# Patient Record
Sex: Female | Born: 1935
Health system: Southern US, Community
[De-identification: ages and names within clinical notes are randomized; demographics above are authoritative.]

## PROBLEM LIST (undated history)

## (undated) DIAGNOSIS — E119 Type 2 diabetes mellitus without complications: Secondary | ICD-10-CM

## (undated) DIAGNOSIS — L570 Actinic keratosis: Secondary | ICD-10-CM

## (undated) HISTORY — DX: Actinic keratosis: L57.0

## (undated) HISTORY — DX: Type 2 diabetes mellitus without complications: E11.9

---

## 2005-06-05 ENCOUNTER — Ambulatory Visit: Payer: Self-pay | Admitting: Internal Medicine

## 2005-12-28 ENCOUNTER — Ambulatory Visit: Payer: Self-pay | Admitting: Internal Medicine

## 2006-06-10 ENCOUNTER — Ambulatory Visit: Payer: Self-pay | Admitting: Internal Medicine

## 2007-08-01 ENCOUNTER — Ambulatory Visit: Payer: Self-pay | Admitting: Internal Medicine

## 2008-08-03 ENCOUNTER — Ambulatory Visit: Payer: Self-pay | Admitting: Internal Medicine

## 2009-08-13 ENCOUNTER — Ambulatory Visit: Payer: Self-pay | Admitting: Internal Medicine

## 2009-08-15 ENCOUNTER — Ambulatory Visit: Payer: Self-pay | Admitting: Internal Medicine

## 2010-02-18 ENCOUNTER — Ambulatory Visit: Payer: Self-pay | Admitting: Internal Medicine

## 2010-04-07 DIAGNOSIS — C4491 Basal cell carcinoma of skin, unspecified: Secondary | ICD-10-CM

## 2010-04-07 HISTORY — DX: Basal cell carcinoma of skin, unspecified: C44.91

## 2011-10-07 ENCOUNTER — Ambulatory Visit: Payer: Self-pay

## 2012-10-14 ENCOUNTER — Ambulatory Visit: Payer: Self-pay | Admitting: Internal Medicine

## 2013-03-21 ENCOUNTER — Ambulatory Visit: Payer: Self-pay | Admitting: Gastroenterology

## 2013-12-22 ENCOUNTER — Ambulatory Visit: Payer: Self-pay | Admitting: Internal Medicine

## 2015-03-12 ENCOUNTER — Other Ambulatory Visit: Payer: Self-pay | Admitting: Internal Medicine

## 2015-03-12 DIAGNOSIS — Z1231 Encounter for screening mammogram for malignant neoplasm of breast: Secondary | ICD-10-CM

## 2015-03-13 ENCOUNTER — Ambulatory Visit
Admission: RE | Admit: 2015-03-13 | Discharge: 2015-03-13 | Disposition: A | Discharge status: Home / Self Care | Payer: Medicare Other | Source: Ambulatory Visit | Attending: Internal Medicine | Admitting: Internal Medicine

## 2015-03-13 ENCOUNTER — Other Ambulatory Visit: Payer: Self-pay | Admitting: Internal Medicine

## 2015-03-13 DIAGNOSIS — Z1231 Encounter for screening mammogram for malignant neoplasm of breast: Secondary | ICD-10-CM

## 2015-12-09 DIAGNOSIS — E1169 Type 2 diabetes mellitus with other specified complication: Secondary | ICD-10-CM | POA: Diagnosis not present

## 2015-12-09 DIAGNOSIS — E785 Hyperlipidemia, unspecified: Secondary | ICD-10-CM | POA: Diagnosis not present

## 2015-12-09 DIAGNOSIS — N183 Chronic kidney disease, stage 3 (moderate): Secondary | ICD-10-CM | POA: Diagnosis not present

## 2015-12-09 DIAGNOSIS — E1122 Type 2 diabetes mellitus with diabetic chronic kidney disease: Secondary | ICD-10-CM | POA: Diagnosis not present

## 2015-12-16 DIAGNOSIS — E1122 Type 2 diabetes mellitus with diabetic chronic kidney disease: Secondary | ICD-10-CM | POA: Diagnosis not present

## 2015-12-16 DIAGNOSIS — E1169 Type 2 diabetes mellitus with other specified complication: Secondary | ICD-10-CM | POA: Diagnosis not present

## 2015-12-16 DIAGNOSIS — M1A9XX0 Chronic gout, unspecified, without tophus (tophi): Secondary | ICD-10-CM | POA: Diagnosis not present

## 2015-12-16 DIAGNOSIS — I10 Essential (primary) hypertension: Secondary | ICD-10-CM | POA: Diagnosis not present

## 2015-12-16 DIAGNOSIS — N183 Chronic kidney disease, stage 3 (moderate): Secondary | ICD-10-CM | POA: Diagnosis not present

## 2015-12-16 DIAGNOSIS — E785 Hyperlipidemia, unspecified: Secondary | ICD-10-CM | POA: Diagnosis not present

## 2016-02-17 DIAGNOSIS — H33311 Horseshoe tear of retina without detachment, right eye: Secondary | ICD-10-CM | POA: Diagnosis not present

## 2016-03-12 DIAGNOSIS — Y92099 Unspecified place in other non-institutional residence as the place of occurrence of the external cause: Secondary | ICD-10-CM | POA: Diagnosis not present

## 2016-03-12 DIAGNOSIS — W19XXXA Unspecified fall, initial encounter: Secondary | ICD-10-CM | POA: Diagnosis not present

## 2016-03-12 DIAGNOSIS — S39012A Strain of muscle, fascia and tendon of lower back, initial encounter: Secondary | ICD-10-CM | POA: Diagnosis not present

## 2016-04-03 DIAGNOSIS — H33311 Horseshoe tear of retina without detachment, right eye: Secondary | ICD-10-CM | POA: Diagnosis not present

## 2016-04-13 DIAGNOSIS — E1122 Type 2 diabetes mellitus with diabetic chronic kidney disease: Secondary | ICD-10-CM | POA: Diagnosis not present

## 2016-04-13 DIAGNOSIS — N183 Chronic kidney disease, stage 3 (moderate): Secondary | ICD-10-CM | POA: Diagnosis not present

## 2016-04-13 DIAGNOSIS — I10 Essential (primary) hypertension: Secondary | ICD-10-CM | POA: Diagnosis not present

## 2016-04-13 DIAGNOSIS — E1169 Type 2 diabetes mellitus with other specified complication: Secondary | ICD-10-CM | POA: Diagnosis not present

## 2016-04-13 DIAGNOSIS — E785 Hyperlipidemia, unspecified: Secondary | ICD-10-CM | POA: Diagnosis not present

## 2016-04-20 DIAGNOSIS — E785 Hyperlipidemia, unspecified: Secondary | ICD-10-CM | POA: Diagnosis not present

## 2016-04-20 DIAGNOSIS — E1169 Type 2 diabetes mellitus with other specified complication: Secondary | ICD-10-CM | POA: Diagnosis not present

## 2016-04-20 DIAGNOSIS — N183 Chronic kidney disease, stage 3 (moderate): Secondary | ICD-10-CM | POA: Diagnosis not present

## 2016-04-20 DIAGNOSIS — I10 Essential (primary) hypertension: Secondary | ICD-10-CM | POA: Diagnosis not present

## 2016-04-20 DIAGNOSIS — E1122 Type 2 diabetes mellitus with diabetic chronic kidney disease: Secondary | ICD-10-CM | POA: Diagnosis not present

## 2016-04-20 DIAGNOSIS — M1A9XX0 Chronic gout, unspecified, without tophus (tophi): Secondary | ICD-10-CM | POA: Diagnosis not present

## 2016-05-05 ENCOUNTER — Other Ambulatory Visit: Payer: Self-pay | Admitting: Internal Medicine

## 2016-05-05 DIAGNOSIS — I8393 Asymptomatic varicose veins of bilateral lower extremities: Secondary | ICD-10-CM | POA: Diagnosis not present

## 2016-05-05 DIAGNOSIS — L821 Other seborrheic keratosis: Secondary | ICD-10-CM | POA: Diagnosis not present

## 2016-05-05 DIAGNOSIS — L820 Inflamed seborrheic keratosis: Secondary | ICD-10-CM | POA: Diagnosis not present

## 2016-05-05 DIAGNOSIS — D1800 Hemangioma unspecified site: Secondary | ICD-10-CM | POA: Diagnosis not present

## 2016-05-05 DIAGNOSIS — L814 Other melanin hyperpigmentation: Secondary | ICD-10-CM | POA: Diagnosis not present

## 2016-05-05 DIAGNOSIS — Z85828 Personal history of other malignant neoplasm of skin: Secondary | ICD-10-CM | POA: Diagnosis not present

## 2016-05-05 DIAGNOSIS — Z1231 Encounter for screening mammogram for malignant neoplasm of breast: Secondary | ICD-10-CM

## 2016-05-05 DIAGNOSIS — Z1283 Encounter for screening for malignant neoplasm of skin: Secondary | ICD-10-CM | POA: Diagnosis not present

## 2016-05-05 DIAGNOSIS — L905 Scar conditions and fibrosis of skin: Secondary | ICD-10-CM | POA: Diagnosis not present

## 2016-05-25 ENCOUNTER — Ambulatory Visit
Admission: RE | Admit: 2016-05-25 | Discharge: 2016-05-25 | Disposition: A | Discharge status: Home / Self Care | Payer: PPO | Source: Ambulatory Visit | Attending: Internal Medicine | Admitting: Internal Medicine

## 2016-05-25 DIAGNOSIS — Z1231 Encounter for screening mammogram for malignant neoplasm of breast: Secondary | ICD-10-CM | POA: Diagnosis not present

## 2016-05-29 DIAGNOSIS — Z23 Encounter for immunization: Secondary | ICD-10-CM | POA: Diagnosis not present

## 2016-09-14 DIAGNOSIS — E785 Hyperlipidemia, unspecified: Secondary | ICD-10-CM | POA: Diagnosis not present

## 2016-09-14 DIAGNOSIS — I10 Essential (primary) hypertension: Secondary | ICD-10-CM | POA: Diagnosis not present

## 2016-09-14 DIAGNOSIS — E1122 Type 2 diabetes mellitus with diabetic chronic kidney disease: Secondary | ICD-10-CM | POA: Diagnosis not present

## 2016-09-14 DIAGNOSIS — N183 Chronic kidney disease, stage 3 (moderate): Secondary | ICD-10-CM | POA: Diagnosis not present

## 2016-09-14 DIAGNOSIS — E1169 Type 2 diabetes mellitus with other specified complication: Secondary | ICD-10-CM | POA: Diagnosis not present

## 2016-09-21 DIAGNOSIS — I10 Essential (primary) hypertension: Secondary | ICD-10-CM | POA: Diagnosis not present

## 2016-09-21 DIAGNOSIS — E1122 Type 2 diabetes mellitus with diabetic chronic kidney disease: Secondary | ICD-10-CM | POA: Diagnosis not present

## 2016-09-21 DIAGNOSIS — N183 Chronic kidney disease, stage 3 (moderate): Secondary | ICD-10-CM | POA: Diagnosis not present

## 2016-09-21 DIAGNOSIS — E785 Hyperlipidemia, unspecified: Secondary | ICD-10-CM | POA: Diagnosis not present

## 2016-09-21 DIAGNOSIS — Z0000 Encounter for general adult medical examination without abnormal findings: Secondary | ICD-10-CM | POA: Diagnosis not present

## 2016-09-21 DIAGNOSIS — E1169 Type 2 diabetes mellitus with other specified complication: Secondary | ICD-10-CM | POA: Diagnosis not present

## 2017-01-11 DIAGNOSIS — E785 Hyperlipidemia, unspecified: Secondary | ICD-10-CM | POA: Diagnosis not present

## 2017-01-11 DIAGNOSIS — I10 Essential (primary) hypertension: Secondary | ICD-10-CM | POA: Diagnosis not present

## 2017-01-11 DIAGNOSIS — N183 Chronic kidney disease, stage 3 (moderate): Secondary | ICD-10-CM | POA: Diagnosis not present

## 2017-01-11 DIAGNOSIS — E1122 Type 2 diabetes mellitus with diabetic chronic kidney disease: Secondary | ICD-10-CM | POA: Diagnosis not present

## 2017-01-11 DIAGNOSIS — E1169 Type 2 diabetes mellitus with other specified complication: Secondary | ICD-10-CM | POA: Diagnosis not present

## 2017-01-18 DIAGNOSIS — I10 Essential (primary) hypertension: Secondary | ICD-10-CM | POA: Diagnosis not present

## 2017-01-18 DIAGNOSIS — E1169 Type 2 diabetes mellitus with other specified complication: Secondary | ICD-10-CM | POA: Diagnosis not present

## 2017-01-18 DIAGNOSIS — E1122 Type 2 diabetes mellitus with diabetic chronic kidney disease: Secondary | ICD-10-CM | POA: Diagnosis not present

## 2017-01-18 DIAGNOSIS — N183 Chronic kidney disease, stage 3 (moderate): Secondary | ICD-10-CM | POA: Diagnosis not present

## 2017-01-18 DIAGNOSIS — E785 Hyperlipidemia, unspecified: Secondary | ICD-10-CM | POA: Diagnosis not present

## 2017-01-18 DIAGNOSIS — M1A9XX0 Chronic gout, unspecified, without tophus (tophi): Secondary | ICD-10-CM | POA: Diagnosis not present

## 2017-05-17 DIAGNOSIS — N183 Chronic kidney disease, stage 3 (moderate): Secondary | ICD-10-CM | POA: Diagnosis not present

## 2017-05-17 DIAGNOSIS — L578 Other skin changes due to chronic exposure to nonionizing radiation: Secondary | ICD-10-CM | POA: Diagnosis not present

## 2017-05-17 DIAGNOSIS — D1800 Hemangioma unspecified site: Secondary | ICD-10-CM | POA: Diagnosis not present

## 2017-05-17 DIAGNOSIS — I8393 Asymptomatic varicose veins of bilateral lower extremities: Secondary | ICD-10-CM | POA: Diagnosis not present

## 2017-05-17 DIAGNOSIS — E785 Hyperlipidemia, unspecified: Secondary | ICD-10-CM | POA: Diagnosis not present

## 2017-05-17 DIAGNOSIS — E1122 Type 2 diabetes mellitus with diabetic chronic kidney disease: Secondary | ICD-10-CM | POA: Diagnosis not present

## 2017-05-17 DIAGNOSIS — L821 Other seborrheic keratosis: Secondary | ICD-10-CM | POA: Diagnosis not present

## 2017-05-17 DIAGNOSIS — E1169 Type 2 diabetes mellitus with other specified complication: Secondary | ICD-10-CM | POA: Diagnosis not present

## 2017-05-17 DIAGNOSIS — I10 Essential (primary) hypertension: Secondary | ICD-10-CM | POA: Diagnosis not present

## 2017-05-17 DIAGNOSIS — L570 Actinic keratosis: Secondary | ICD-10-CM | POA: Diagnosis not present

## 2017-05-17 DIAGNOSIS — Z1283 Encounter for screening for malignant neoplasm of skin: Secondary | ICD-10-CM | POA: Diagnosis not present

## 2017-05-17 DIAGNOSIS — Z85828 Personal history of other malignant neoplasm of skin: Secondary | ICD-10-CM | POA: Diagnosis not present

## 2017-05-24 DIAGNOSIS — E1122 Type 2 diabetes mellitus with diabetic chronic kidney disease: Secondary | ICD-10-CM | POA: Diagnosis not present

## 2017-05-24 DIAGNOSIS — N183 Chronic kidney disease, stage 3 (moderate): Secondary | ICD-10-CM | POA: Diagnosis not present

## 2017-05-24 DIAGNOSIS — I10 Essential (primary) hypertension: Secondary | ICD-10-CM | POA: Diagnosis not present

## 2017-05-24 DIAGNOSIS — E785 Hyperlipidemia, unspecified: Secondary | ICD-10-CM | POA: Diagnosis not present

## 2017-05-24 DIAGNOSIS — E1169 Type 2 diabetes mellitus with other specified complication: Secondary | ICD-10-CM | POA: Diagnosis not present

## 2017-05-28 DIAGNOSIS — Z23 Encounter for immunization: Secondary | ICD-10-CM | POA: Diagnosis not present

## 2017-07-15 ENCOUNTER — Other Ambulatory Visit: Payer: Self-pay | Admitting: Internal Medicine

## 2017-07-15 DIAGNOSIS — Z1231 Encounter for screening mammogram for malignant neoplasm of breast: Secondary | ICD-10-CM

## 2017-08-10 ENCOUNTER — Ambulatory Visit
Admission: RE | Admit: 2017-08-10 | Discharge: 2017-08-10 | Disposition: A | Discharge status: Home / Self Care | Payer: PPO | Source: Ambulatory Visit | Attending: Internal Medicine | Admitting: Internal Medicine

## 2017-08-10 DIAGNOSIS — Z1231 Encounter for screening mammogram for malignant neoplasm of breast: Secondary | ICD-10-CM | POA: Diagnosis not present

## 2017-08-19 DIAGNOSIS — H2513 Age-related nuclear cataract, bilateral: Secondary | ICD-10-CM | POA: Diagnosis not present

## 2017-09-20 DIAGNOSIS — I10 Essential (primary) hypertension: Secondary | ICD-10-CM | POA: Diagnosis not present

## 2017-09-20 DIAGNOSIS — E785 Hyperlipidemia, unspecified: Secondary | ICD-10-CM | POA: Diagnosis not present

## 2017-09-20 DIAGNOSIS — E1122 Type 2 diabetes mellitus with diabetic chronic kidney disease: Secondary | ICD-10-CM | POA: Diagnosis not present

## 2017-09-20 DIAGNOSIS — E1169 Type 2 diabetes mellitus with other specified complication: Secondary | ICD-10-CM | POA: Diagnosis not present

## 2017-09-20 DIAGNOSIS — N183 Chronic kidney disease, stage 3 (moderate): Secondary | ICD-10-CM | POA: Diagnosis not present

## 2017-09-27 DIAGNOSIS — E1122 Type 2 diabetes mellitus with diabetic chronic kidney disease: Secondary | ICD-10-CM | POA: Diagnosis not present

## 2017-09-27 DIAGNOSIS — N183 Chronic kidney disease, stage 3 (moderate): Secondary | ICD-10-CM | POA: Diagnosis not present

## 2017-09-27 DIAGNOSIS — I10 Essential (primary) hypertension: Secondary | ICD-10-CM | POA: Diagnosis not present

## 2017-09-27 DIAGNOSIS — E1169 Type 2 diabetes mellitus with other specified complication: Secondary | ICD-10-CM | POA: Diagnosis not present

## 2017-09-27 DIAGNOSIS — E785 Hyperlipidemia, unspecified: Secondary | ICD-10-CM | POA: Diagnosis not present

## 2017-10-21 DIAGNOSIS — R399 Unspecified symptoms and signs involving the genitourinary system: Secondary | ICD-10-CM | POA: Diagnosis not present

## 2017-10-21 DIAGNOSIS — M5431 Sciatica, right side: Secondary | ICD-10-CM | POA: Diagnosis not present

## 2017-10-21 DIAGNOSIS — J069 Acute upper respiratory infection, unspecified: Secondary | ICD-10-CM | POA: Diagnosis not present

## 2017-10-21 DIAGNOSIS — M5432 Sciatica, left side: Secondary | ICD-10-CM | POA: Diagnosis not present

## 2017-10-21 DIAGNOSIS — K219 Gastro-esophageal reflux disease without esophagitis: Secondary | ICD-10-CM | POA: Diagnosis not present

## 2018-01-17 DIAGNOSIS — E1122 Type 2 diabetes mellitus with diabetic chronic kidney disease: Secondary | ICD-10-CM | POA: Diagnosis not present

## 2018-01-17 DIAGNOSIS — N183 Chronic kidney disease, stage 3 (moderate): Secondary | ICD-10-CM | POA: Diagnosis not present

## 2018-01-17 DIAGNOSIS — E1169 Type 2 diabetes mellitus with other specified complication: Secondary | ICD-10-CM | POA: Diagnosis not present

## 2018-01-17 DIAGNOSIS — E785 Hyperlipidemia, unspecified: Secondary | ICD-10-CM | POA: Diagnosis not present

## 2018-01-17 DIAGNOSIS — I10 Essential (primary) hypertension: Secondary | ICD-10-CM | POA: Diagnosis not present

## 2018-01-20 DIAGNOSIS — I10 Essential (primary) hypertension: Secondary | ICD-10-CM | POA: Diagnosis not present

## 2018-01-20 DIAGNOSIS — G5693 Unspecified mononeuropathy of bilateral upper limbs: Secondary | ICD-10-CM | POA: Diagnosis not present

## 2018-01-20 DIAGNOSIS — E785 Hyperlipidemia, unspecified: Secondary | ICD-10-CM | POA: Diagnosis not present

## 2018-01-20 DIAGNOSIS — E1169 Type 2 diabetes mellitus with other specified complication: Secondary | ICD-10-CM | POA: Diagnosis not present

## 2018-01-20 DIAGNOSIS — Z0000 Encounter for general adult medical examination without abnormal findings: Secondary | ICD-10-CM | POA: Diagnosis not present

## 2018-01-20 DIAGNOSIS — E1122 Type 2 diabetes mellitus with diabetic chronic kidney disease: Secondary | ICD-10-CM | POA: Diagnosis not present

## 2018-01-20 DIAGNOSIS — N183 Chronic kidney disease, stage 3 (moderate): Secondary | ICD-10-CM | POA: Diagnosis not present

## 2018-01-24 DIAGNOSIS — W010XXA Fall on same level from slipping, tripping and stumbling without subsequent striking against object, initial encounter: Secondary | ICD-10-CM | POA: Diagnosis not present

## 2018-01-24 DIAGNOSIS — Y92009 Unspecified place in unspecified non-institutional (private) residence as the place of occurrence of the external cause: Secondary | ICD-10-CM | POA: Diagnosis not present

## 2018-01-24 DIAGNOSIS — S79911A Unspecified injury of right hip, initial encounter: Secondary | ICD-10-CM | POA: Diagnosis not present

## 2018-02-15 DIAGNOSIS — M1A9XX0 Chronic gout, unspecified, without tophus (tophi): Secondary | ICD-10-CM | POA: Diagnosis not present

## 2018-02-15 DIAGNOSIS — S7001XA Contusion of right hip, initial encounter: Secondary | ICD-10-CM | POA: Diagnosis not present

## 2018-02-15 DIAGNOSIS — S7011XA Contusion of right thigh, initial encounter: Secondary | ICD-10-CM | POA: Diagnosis not present

## 2018-02-15 DIAGNOSIS — E1122 Type 2 diabetes mellitus with diabetic chronic kidney disease: Secondary | ICD-10-CM | POA: Diagnosis not present

## 2018-02-15 DIAGNOSIS — N183 Chronic kidney disease, stage 3 (moderate): Secondary | ICD-10-CM | POA: Diagnosis not present

## 2018-03-15 DIAGNOSIS — M542 Cervicalgia: Secondary | ICD-10-CM | POA: Diagnosis not present

## 2018-03-15 DIAGNOSIS — R200 Anesthesia of skin: Secondary | ICD-10-CM | POA: Diagnosis not present

## 2018-03-25 DIAGNOSIS — G5603 Carpal tunnel syndrome, bilateral upper limbs: Secondary | ICD-10-CM | POA: Diagnosis not present

## 2018-04-01 DIAGNOSIS — G5603 Carpal tunnel syndrome, bilateral upper limbs: Secondary | ICD-10-CM | POA: Diagnosis not present

## 2018-05-17 DIAGNOSIS — L570 Actinic keratosis: Secondary | ICD-10-CM | POA: Diagnosis not present

## 2018-05-17 DIAGNOSIS — Z85828 Personal history of other malignant neoplasm of skin: Secondary | ICD-10-CM | POA: Diagnosis not present

## 2018-05-17 DIAGNOSIS — Z1283 Encounter for screening for malignant neoplasm of skin: Secondary | ICD-10-CM | POA: Diagnosis not present

## 2018-05-17 DIAGNOSIS — L300 Nummular dermatitis: Secondary | ICD-10-CM | POA: Diagnosis not present

## 2018-05-17 DIAGNOSIS — D1800 Hemangioma unspecified site: Secondary | ICD-10-CM | POA: Diagnosis not present

## 2018-05-17 DIAGNOSIS — L720 Epidermal cyst: Secondary | ICD-10-CM | POA: Diagnosis not present

## 2018-05-17 DIAGNOSIS — L820 Inflamed seborrheic keratosis: Secondary | ICD-10-CM | POA: Diagnosis not present

## 2018-05-17 DIAGNOSIS — L821 Other seborrheic keratosis: Secondary | ICD-10-CM | POA: Diagnosis not present

## 2018-05-23 DIAGNOSIS — N183 Chronic kidney disease, stage 3 (moderate): Secondary | ICD-10-CM | POA: Diagnosis not present

## 2018-05-23 DIAGNOSIS — G5693 Unspecified mononeuropathy of bilateral upper limbs: Secondary | ICD-10-CM | POA: Diagnosis not present

## 2018-05-23 DIAGNOSIS — E1122 Type 2 diabetes mellitus with diabetic chronic kidney disease: Secondary | ICD-10-CM | POA: Diagnosis not present

## 2018-05-23 DIAGNOSIS — E1169 Type 2 diabetes mellitus with other specified complication: Secondary | ICD-10-CM | POA: Diagnosis not present

## 2018-05-23 DIAGNOSIS — E785 Hyperlipidemia, unspecified: Secondary | ICD-10-CM | POA: Diagnosis not present

## 2018-05-30 DIAGNOSIS — E1169 Type 2 diabetes mellitus with other specified complication: Secondary | ICD-10-CM | POA: Diagnosis not present

## 2018-05-30 DIAGNOSIS — N183 Chronic kidney disease, stage 3 (moderate): Secondary | ICD-10-CM | POA: Diagnosis not present

## 2018-05-30 DIAGNOSIS — E1122 Type 2 diabetes mellitus with diabetic chronic kidney disease: Secondary | ICD-10-CM | POA: Diagnosis not present

## 2018-05-30 DIAGNOSIS — I10 Essential (primary) hypertension: Secondary | ICD-10-CM | POA: Diagnosis not present

## 2018-05-30 DIAGNOSIS — E785 Hyperlipidemia, unspecified: Secondary | ICD-10-CM | POA: Diagnosis not present

## 2018-06-10 DIAGNOSIS — Z23 Encounter for immunization: Secondary | ICD-10-CM | POA: Diagnosis not present

## 2018-07-12 ENCOUNTER — Other Ambulatory Visit: Payer: Self-pay | Admitting: Internal Medicine

## 2018-07-12 DIAGNOSIS — Z1231 Encounter for screening mammogram for malignant neoplasm of breast: Secondary | ICD-10-CM

## 2018-08-11 ENCOUNTER — Ambulatory Visit
Admission: RE | Admit: 2018-08-11 | Discharge: 2018-08-11 | Disposition: A | Discharge status: Home / Self Care | Payer: PPO | Source: Ambulatory Visit | Attending: Internal Medicine | Admitting: Internal Medicine

## 2018-08-11 DIAGNOSIS — Z1231 Encounter for screening mammogram for malignant neoplasm of breast: Secondary | ICD-10-CM | POA: Diagnosis not present

## 2018-09-26 DIAGNOSIS — I10 Essential (primary) hypertension: Secondary | ICD-10-CM | POA: Diagnosis not present

## 2018-09-26 DIAGNOSIS — E785 Hyperlipidemia, unspecified: Secondary | ICD-10-CM | POA: Diagnosis not present

## 2018-09-26 DIAGNOSIS — E1122 Type 2 diabetes mellitus with diabetic chronic kidney disease: Secondary | ICD-10-CM | POA: Diagnosis not present

## 2018-09-26 DIAGNOSIS — N183 Chronic kidney disease, stage 3 (moderate): Secondary | ICD-10-CM | POA: Diagnosis not present

## 2018-09-26 DIAGNOSIS — E1169 Type 2 diabetes mellitus with other specified complication: Secondary | ICD-10-CM | POA: Diagnosis not present

## 2018-10-03 DIAGNOSIS — E1122 Type 2 diabetes mellitus with diabetic chronic kidney disease: Secondary | ICD-10-CM | POA: Diagnosis not present

## 2018-10-03 DIAGNOSIS — I10 Essential (primary) hypertension: Secondary | ICD-10-CM | POA: Diagnosis not present

## 2018-10-03 DIAGNOSIS — E785 Hyperlipidemia, unspecified: Secondary | ICD-10-CM | POA: Diagnosis not present

## 2018-10-03 DIAGNOSIS — N183 Chronic kidney disease, stage 3 (moderate): Secondary | ICD-10-CM | POA: Diagnosis not present

## 2018-10-03 DIAGNOSIS — E1169 Type 2 diabetes mellitus with other specified complication: Secondary | ICD-10-CM | POA: Diagnosis not present

## 2019-02-13 DIAGNOSIS — I10 Essential (primary) hypertension: Secondary | ICD-10-CM | POA: Diagnosis not present

## 2019-02-13 DIAGNOSIS — E1122 Type 2 diabetes mellitus with diabetic chronic kidney disease: Secondary | ICD-10-CM | POA: Diagnosis not present

## 2019-02-13 DIAGNOSIS — Z0000 Encounter for general adult medical examination without abnormal findings: Secondary | ICD-10-CM | POA: Diagnosis not present

## 2019-02-13 DIAGNOSIS — E785 Hyperlipidemia, unspecified: Secondary | ICD-10-CM | POA: Diagnosis not present

## 2019-02-13 DIAGNOSIS — N183 Chronic kidney disease, stage 3 (moderate): Secondary | ICD-10-CM | POA: Diagnosis not present

## 2019-02-13 DIAGNOSIS — E1169 Type 2 diabetes mellitus with other specified complication: Secondary | ICD-10-CM | POA: Diagnosis not present

## 2019-03-02 DIAGNOSIS — H2513 Age-related nuclear cataract, bilateral: Secondary | ICD-10-CM | POA: Diagnosis not present

## 2019-05-23 DIAGNOSIS — Z1283 Encounter for screening for malignant neoplasm of skin: Secondary | ICD-10-CM | POA: Diagnosis not present

## 2019-05-23 DIAGNOSIS — D1800 Hemangioma unspecified site: Secondary | ICD-10-CM | POA: Diagnosis not present

## 2019-05-23 DIAGNOSIS — L821 Other seborrheic keratosis: Secondary | ICD-10-CM | POA: Diagnosis not present

## 2019-05-23 DIAGNOSIS — L820 Inflamed seborrheic keratosis: Secondary | ICD-10-CM | POA: Diagnosis not present

## 2019-05-23 DIAGNOSIS — Z85828 Personal history of other malignant neoplasm of skin: Secondary | ICD-10-CM | POA: Diagnosis not present

## 2019-06-09 DIAGNOSIS — Z23 Encounter for immunization: Secondary | ICD-10-CM | POA: Diagnosis not present

## 2019-06-12 DIAGNOSIS — E785 Hyperlipidemia, unspecified: Secondary | ICD-10-CM | POA: Diagnosis not present

## 2019-06-12 DIAGNOSIS — N183 Chronic kidney disease, stage 3 unspecified: Secondary | ICD-10-CM | POA: Diagnosis not present

## 2019-06-12 DIAGNOSIS — E1169 Type 2 diabetes mellitus with other specified complication: Secondary | ICD-10-CM | POA: Diagnosis not present

## 2019-06-12 DIAGNOSIS — E1122 Type 2 diabetes mellitus with diabetic chronic kidney disease: Secondary | ICD-10-CM | POA: Diagnosis not present

## 2019-06-12 DIAGNOSIS — I10 Essential (primary) hypertension: Secondary | ICD-10-CM | POA: Diagnosis not present

## 2019-06-19 DIAGNOSIS — E1121 Type 2 diabetes mellitus with diabetic nephropathy: Secondary | ICD-10-CM | POA: Diagnosis not present

## 2019-06-19 DIAGNOSIS — Z9989 Dependence on other enabling machines and devices: Secondary | ICD-10-CM | POA: Diagnosis not present

## 2019-06-19 DIAGNOSIS — E1169 Type 2 diabetes mellitus with other specified complication: Secondary | ICD-10-CM | POA: Diagnosis not present

## 2019-06-19 DIAGNOSIS — I10 Essential (primary) hypertension: Secondary | ICD-10-CM | POA: Diagnosis not present

## 2019-06-19 DIAGNOSIS — E785 Hyperlipidemia, unspecified: Secondary | ICD-10-CM | POA: Diagnosis not present

## 2019-06-19 DIAGNOSIS — N1832 Chronic kidney disease, stage 3b: Secondary | ICD-10-CM | POA: Diagnosis not present

## 2019-07-07 ENCOUNTER — Other Ambulatory Visit: Payer: Self-pay | Admitting: Internal Medicine

## 2019-07-07 DIAGNOSIS — Z1231 Encounter for screening mammogram for malignant neoplasm of breast: Secondary | ICD-10-CM

## 2019-08-14 ENCOUNTER — Ambulatory Visit
Admission: RE | Admit: 2019-08-14 | Discharge: 2019-08-14 | Disposition: A | Discharge status: Home / Self Care | Payer: PPO | Source: Ambulatory Visit | Attending: Internal Medicine | Admitting: Internal Medicine

## 2019-08-14 DIAGNOSIS — Z1231 Encounter for screening mammogram for malignant neoplasm of breast: Secondary | ICD-10-CM | POA: Diagnosis not present

## 2019-10-09 DIAGNOSIS — E1169 Type 2 diabetes mellitus with other specified complication: Secondary | ICD-10-CM | POA: Diagnosis not present

## 2019-10-09 DIAGNOSIS — E1121 Type 2 diabetes mellitus with diabetic nephropathy: Secondary | ICD-10-CM | POA: Diagnosis not present

## 2019-10-09 DIAGNOSIS — I10 Essential (primary) hypertension: Secondary | ICD-10-CM | POA: Diagnosis not present

## 2019-10-09 DIAGNOSIS — E785 Hyperlipidemia, unspecified: Secondary | ICD-10-CM | POA: Diagnosis not present

## 2019-10-09 DIAGNOSIS — N1832 Chronic kidney disease, stage 3b: Secondary | ICD-10-CM | POA: Diagnosis not present

## 2019-10-16 DIAGNOSIS — E785 Hyperlipidemia, unspecified: Secondary | ICD-10-CM | POA: Diagnosis not present

## 2019-10-16 DIAGNOSIS — I10 Essential (primary) hypertension: Secondary | ICD-10-CM | POA: Diagnosis not present

## 2019-10-16 DIAGNOSIS — E1169 Type 2 diabetes mellitus with other specified complication: Secondary | ICD-10-CM | POA: Diagnosis not present

## 2019-10-16 DIAGNOSIS — N1832 Chronic kidney disease, stage 3b: Secondary | ICD-10-CM | POA: Diagnosis not present

## 2019-10-16 DIAGNOSIS — E1121 Type 2 diabetes mellitus with diabetic nephropathy: Secondary | ICD-10-CM | POA: Diagnosis not present

## 2019-10-16 DIAGNOSIS — Z9989 Dependence on other enabling machines and devices: Secondary | ICD-10-CM | POA: Diagnosis not present

## 2020-02-12 DIAGNOSIS — N1832 Chronic kidney disease, stage 3b: Secondary | ICD-10-CM | POA: Diagnosis not present

## 2020-02-12 DIAGNOSIS — E1169 Type 2 diabetes mellitus with other specified complication: Secondary | ICD-10-CM | POA: Diagnosis not present

## 2020-02-12 DIAGNOSIS — I10 Essential (primary) hypertension: Secondary | ICD-10-CM | POA: Diagnosis not present

## 2020-02-12 DIAGNOSIS — E785 Hyperlipidemia, unspecified: Secondary | ICD-10-CM | POA: Diagnosis not present

## 2020-02-12 DIAGNOSIS — E1122 Type 2 diabetes mellitus with diabetic chronic kidney disease: Secondary | ICD-10-CM | POA: Diagnosis not present

## 2020-02-19 DIAGNOSIS — Z9989 Dependence on other enabling machines and devices: Secondary | ICD-10-CM | POA: Diagnosis not present

## 2020-02-19 DIAGNOSIS — I10 Essential (primary) hypertension: Secondary | ICD-10-CM | POA: Diagnosis not present

## 2020-02-19 DIAGNOSIS — E1122 Type 2 diabetes mellitus with diabetic chronic kidney disease: Secondary | ICD-10-CM | POA: Diagnosis not present

## 2020-02-19 DIAGNOSIS — N1832 Chronic kidney disease, stage 3b: Secondary | ICD-10-CM | POA: Diagnosis not present

## 2020-02-19 DIAGNOSIS — E785 Hyperlipidemia, unspecified: Secondary | ICD-10-CM | POA: Diagnosis not present

## 2020-02-19 DIAGNOSIS — Z0000 Encounter for general adult medical examination without abnormal findings: Secondary | ICD-10-CM | POA: Diagnosis not present

## 2020-02-19 DIAGNOSIS — E1169 Type 2 diabetes mellitus with other specified complication: Secondary | ICD-10-CM | POA: Diagnosis not present

## 2020-03-04 DIAGNOSIS — E119 Type 2 diabetes mellitus without complications: Secondary | ICD-10-CM | POA: Diagnosis not present

## 2020-05-21 ENCOUNTER — Ambulatory Visit: Payer: PPO | Admitting: Dermatology

## 2020-05-21 ENCOUNTER — Other Ambulatory Visit: Payer: Self-pay

## 2020-05-21 ENCOUNTER — Encounter: Payer: Self-pay | Admitting: Dermatology

## 2020-05-21 DIAGNOSIS — Z1283 Encounter for screening for malignant neoplasm of skin: Secondary | ICD-10-CM | POA: Diagnosis not present

## 2020-05-21 DIAGNOSIS — M7122 Synovial cyst of popliteal space [Baker], left knee: Secondary | ICD-10-CM

## 2020-05-21 DIAGNOSIS — D1800 Hemangioma unspecified site: Secondary | ICD-10-CM | POA: Diagnosis not present

## 2020-05-21 DIAGNOSIS — I8310 Varicose veins of unspecified lower extremity with inflammation: Secondary | ICD-10-CM

## 2020-05-21 DIAGNOSIS — L821 Other seborrheic keratosis: Secondary | ICD-10-CM

## 2020-05-21 DIAGNOSIS — L578 Other skin changes due to chronic exposure to nonionizing radiation: Secondary | ICD-10-CM

## 2020-05-21 DIAGNOSIS — L814 Other melanin hyperpigmentation: Secondary | ICD-10-CM

## 2020-05-21 DIAGNOSIS — L300 Nummular dermatitis: Secondary | ICD-10-CM | POA: Diagnosis not present

## 2020-05-21 DIAGNOSIS — Z85828 Personal history of other malignant neoplasm of skin: Secondary | ICD-10-CM

## 2020-05-21 DIAGNOSIS — L820 Inflamed seborrheic keratosis: Secondary | ICD-10-CM | POA: Diagnosis not present

## 2020-05-21 MED ORDER — TRIAMCINOLONE ACETONIDE 0.1 % EX CREA: TOPICAL_CREAM | CUTANEOUS | 2 refills | Status: DC

## 2020-05-21 NOTE — Progress Notes (Signed)
Follow-Up Visit   Subjective  Tracy Spears is a 86 y.o. female who presents for the following: Annual Exam (TBSE. Hx BCC Right paranasal) and Nummular Dermatitis (Uses TMC 0.1% Cream prn. She has an itchy spot on her right upper back today.).   The following portions of the chart were reviewed this encounter and updated as appropriate:      Review of Systems:  No other skin or systemic complaints except as noted in HPI or Assessment and Plan.  Objective  Well appearing patient in no apparent distress; mood and affect are within normal limits.  A full examination was performed including scalp, head, eyes, ears, nose, lips, neck, chest, axillae, abdomen, back, buttocks, bilateral upper extremities, bilateral lower extremities, hands, feet, fingers, toes, fingernails, and toenails. All findings within normal limits unless otherwise noted below.  Objective  Right Upper Back: Pink scaly patch with excoriations on right upper back.  Objective  Right Posterior Upper Arm: Waxy flesh patch with mild erythema. Small residual waxy papule on left mid eyelid at margin.  Objective  Left Popliteal: Firm SQ mass.   Assessment & Plan  Nummular dermatitis Right Upper Back  Continue TMC 0.1% Cream QD/BID AAs prn itch  Recommend mild soap and moisturizing cream 1-2 times daily.   Topical steroids (such as triamcinolone, fluocinolone, fluocinonide, mometasone, clobetasol, halobetasol, betamethasone, hydrocortisone) can cause thinning and lightening of the skin if they are used for too long in the same area. Your physician has selected the right strength medicine for your problem and area affected on the body. Please use your medication only as directed by your physician to prevent side effects.    triamcinolone cream (KENALOG) 0.1 % - Right Upper Back  Inflamed seborrheic keratosis Right Posterior Upper Arm  Apply TMC 0.1% Cream BID R upper arm prn itch until improved.  Rec  moisturizer qd.  Left upper eyelid at margin- improved from prior Ln2.  Not bothersome- will observe.  Bakers cyst, left Left Popliteal  Benign, observe.  Not bothersome to patient.   Skin cancer screening performed today.  Actinic Damage - diffuse scaly erythematous macules with underlying dyspigmentation - Recommend daily broad spectrum sunscreen SPF 30+ to sun-exposed areas, reapply every 2 hours as needed.  - Call for new or changing lesions.  History of Basal Cell Carcinoma of the Skin - No evidence of recurrence today R paranasal - Recommend regular full body skin exams - Recommend daily broad spectrum sunscreen SPF 30+ to sun-exposed areas, reapply every 2 hours as needed.  - Call if any new or changing lesions are noted between office visits  Seborrheic Keratoses - Stuck-on, waxy, tan-brown papules and plaques  - Discussed benign etiology and prognosis. - Observe - Call for any changes  Hemangiomas - Red papules - Discussed benign nature - Observe - Call for any changes  Lentigines - Scattered tan macules - Discussed due to sun exposure - Benign, observe - Call for any changes  Varicose Veins - Dilated blue, purple or red veins at the lower extremities - Reassured - These can be treated by sclerotherapy (a procedure to inject a medicine into the veins to make them disappear) if desired, but the treatment is not covered by insurance   Return in about 1 year (around 05/21/2021) for TBSE.  IJamesetta Orleans, CMA, am acting as scribe for Brendolyn Patty, MD .  Documentation: I have reviewed the above documentation for accuracy and completeness, and I agree with the above.  Baxter Flattery  Nicole Kindred MD

## 2020-05-21 NOTE — Patient Instructions (Signed)
Topical steroids (such as triamcinolone, fluocinolone, fluocinonide, mometasone, clobetasol, halobetasol, betamethasone, hydrocortisone) can cause thinning and lightening of the skin if they are used for too long in the same area. Your physician has selected the right strength medicine for your problem and area affected on the body. Please use your medication only as directed by your physician to prevent side effects.   . 

## 2020-06-17 DIAGNOSIS — E1122 Type 2 diabetes mellitus with diabetic chronic kidney disease: Secondary | ICD-10-CM | POA: Diagnosis not present

## 2020-06-17 DIAGNOSIS — I10 Essential (primary) hypertension: Secondary | ICD-10-CM | POA: Diagnosis not present

## 2020-06-17 DIAGNOSIS — E785 Hyperlipidemia, unspecified: Secondary | ICD-10-CM | POA: Diagnosis not present

## 2020-06-17 DIAGNOSIS — E1169 Type 2 diabetes mellitus with other specified complication: Secondary | ICD-10-CM | POA: Diagnosis not present

## 2020-06-17 DIAGNOSIS — N1832 Chronic kidney disease, stage 3b: Secondary | ICD-10-CM | POA: Diagnosis not present

## 2020-06-24 DIAGNOSIS — N1832 Chronic kidney disease, stage 3b: Secondary | ICD-10-CM | POA: Diagnosis not present

## 2020-06-24 DIAGNOSIS — Z23 Encounter for immunization: Secondary | ICD-10-CM | POA: Diagnosis not present

## 2020-06-24 DIAGNOSIS — I10 Essential (primary) hypertension: Secondary | ICD-10-CM | POA: Diagnosis not present

## 2020-06-24 DIAGNOSIS — E1122 Type 2 diabetes mellitus with diabetic chronic kidney disease: Secondary | ICD-10-CM | POA: Diagnosis not present

## 2020-06-24 DIAGNOSIS — Z9989 Dependence on other enabling machines and devices: Secondary | ICD-10-CM | POA: Diagnosis not present

## 2020-06-24 DIAGNOSIS — E785 Hyperlipidemia, unspecified: Secondary | ICD-10-CM | POA: Diagnosis not present

## 2020-06-24 DIAGNOSIS — E1169 Type 2 diabetes mellitus with other specified complication: Secondary | ICD-10-CM | POA: Diagnosis not present

## 2020-07-17 ENCOUNTER — Other Ambulatory Visit: Payer: Self-pay | Admitting: Internal Medicine

## 2020-07-17 DIAGNOSIS — Z1231 Encounter for screening mammogram for malignant neoplasm of breast: Secondary | ICD-10-CM

## 2020-08-21 ENCOUNTER — Ambulatory Visit
Admission: RE | Admit: 2020-08-21 | Discharge: 2020-08-21 | Disposition: A | Discharge status: Home / Self Care | Payer: PPO | Source: Ambulatory Visit | Attending: Internal Medicine | Admitting: Internal Medicine

## 2020-08-21 ENCOUNTER — Other Ambulatory Visit: Payer: Self-pay

## 2020-08-21 DIAGNOSIS — Z1231 Encounter for screening mammogram for malignant neoplasm of breast: Secondary | ICD-10-CM | POA: Insufficient documentation

## 2020-10-21 DIAGNOSIS — E1169 Type 2 diabetes mellitus with other specified complication: Secondary | ICD-10-CM | POA: Diagnosis not present

## 2020-10-21 DIAGNOSIS — I10 Essential (primary) hypertension: Secondary | ICD-10-CM | POA: Diagnosis not present

## 2020-10-21 DIAGNOSIS — E1122 Type 2 diabetes mellitus with diabetic chronic kidney disease: Secondary | ICD-10-CM | POA: Diagnosis not present

## 2020-10-21 DIAGNOSIS — N1832 Chronic kidney disease, stage 3b: Secondary | ICD-10-CM | POA: Diagnosis not present

## 2020-10-21 DIAGNOSIS — E785 Hyperlipidemia, unspecified: Secondary | ICD-10-CM | POA: Diagnosis not present

## 2020-10-28 DIAGNOSIS — E1169 Type 2 diabetes mellitus with other specified complication: Secondary | ICD-10-CM | POA: Diagnosis not present

## 2020-10-28 DIAGNOSIS — E785 Hyperlipidemia, unspecified: Secondary | ICD-10-CM | POA: Diagnosis not present

## 2020-10-28 DIAGNOSIS — E1122 Type 2 diabetes mellitus with diabetic chronic kidney disease: Secondary | ICD-10-CM | POA: Diagnosis not present

## 2020-10-28 DIAGNOSIS — N1832 Chronic kidney disease, stage 3b: Secondary | ICD-10-CM | POA: Diagnosis not present

## 2020-10-28 DIAGNOSIS — I10 Essential (primary) hypertension: Secondary | ICD-10-CM | POA: Diagnosis not present

## 2020-10-28 DIAGNOSIS — Z9989 Dependence on other enabling machines and devices: Secondary | ICD-10-CM | POA: Diagnosis not present

## 2020-12-13 ENCOUNTER — Encounter: Payer: Self-pay | Admitting: Intensive Care

## 2020-12-13 ENCOUNTER — Emergency Department: Payer: PPO

## 2020-12-13 ENCOUNTER — Other Ambulatory Visit: Payer: Self-pay

## 2020-12-13 ENCOUNTER — Emergency Department
Admission: EM | Admit: 2020-12-13 | Discharge: 2020-12-13 | Disposition: A | Discharge status: Home / Self Care | Payer: PPO | Attending: Emergency Medicine | Admitting: Emergency Medicine

## 2020-12-13 DIAGNOSIS — M1611 Unilateral primary osteoarthritis, right hip: Secondary | ICD-10-CM | POA: Diagnosis not present

## 2020-12-13 DIAGNOSIS — W06XXXA Fall from bed, initial encounter: Secondary | ICD-10-CM | POA: Insufficient documentation

## 2020-12-13 DIAGNOSIS — Z7984 Long term (current) use of oral hypoglycemic drugs: Secondary | ICD-10-CM | POA: Diagnosis not present

## 2020-12-13 DIAGNOSIS — S8991XA Unspecified injury of right lower leg, initial encounter: Secondary | ICD-10-CM | POA: Diagnosis not present

## 2020-12-13 DIAGNOSIS — M76891 Other specified enthesopathies of right lower limb, excluding foot: Secondary | ICD-10-CM | POA: Diagnosis not present

## 2020-12-13 DIAGNOSIS — E119 Type 2 diabetes mellitus without complications: Secondary | ICD-10-CM | POA: Insufficient documentation

## 2020-12-13 DIAGNOSIS — Z85828 Personal history of other malignant neoplasm of skin: Secondary | ICD-10-CM | POA: Diagnosis not present

## 2020-12-13 DIAGNOSIS — M533 Sacrococcygeal disorders, not elsewhere classified: Secondary | ICD-10-CM | POA: Diagnosis not present

## 2020-12-13 DIAGNOSIS — M79604 Pain in right leg: Secondary | ICD-10-CM

## 2020-12-13 DIAGNOSIS — Z7982 Long term (current) use of aspirin: Secondary | ICD-10-CM | POA: Diagnosis not present

## 2020-12-13 DIAGNOSIS — M25561 Pain in right knee: Secondary | ICD-10-CM | POA: Diagnosis not present

## 2020-12-13 DIAGNOSIS — M549 Dorsalgia, unspecified: Secondary | ICD-10-CM | POA: Insufficient documentation

## 2020-12-13 MED ORDER — LIDOCAINE 5 % EX PTCH
1.0000 | MEDICATED_PATCH | CUTANEOUS | Status: DC
  Administered 2020-12-13: 1 via TRANSDERMAL
  Filled 2020-12-13: qty 1

## 2020-12-13 MED ORDER — LIDOCAINE 4 % EX PTCH: 1.0000 | MEDICATED_PATCH | CUTANEOUS | 0 refills | Status: DC

## 2020-12-13 MED ORDER — DICLOFENAC SODIUM 1 % EX GEL: 2.0000 g | Freq: Three times a day (TID) | CUTANEOUS | 0 refills | Status: DC | PRN

## 2020-12-13 NOTE — Discharge Instructions (Addendum)
Please take Tylenol, up to 500mg  as needed for pain. You may also use the lidocaine patches and voltaren gel as prescribed. Please follow up with your primary care or return to the ER with any worsening.

## 2020-12-13 NOTE — ED Provider Notes (Signed)
[facility] Emergency Department Provider Note  ____________________________________________   Event Date/Time   First MD Initiated Contact with Patient 12/13/20 1715     (approximate)  I have reviewed the triage vital signs and the nursing notes.   HISTORY  Chief Complaint Leg Pain   HPI Tracy Spears is a 87 y.o. female who presents to the ER for evaluation of right leg pain. Patient states that 2 mornings ago, she believes she pulled something in her leg trying to get out of bed. She denies any falls, but states persistent pain in the right knee and calf region since that time. She does have hx of right sided hip fractures about 10 months ago, but states she has been back to weight bearing without difficulty before the onset of the right lower leg pain. She denies any redness, swelling, fevers, chills, or falls. She denies any hx of DVT but does report prior work up for one. She does report some mild chronic back pain, but states this is her stable pain and is unchanged. She denies any urinary symptoms, loss of bowel or bladder. She has continued to weight bear on the RLE since onset of pain 2 days ago, but states she is walking with a limp.         Past Medical History:  Diagnosis Date  . Actinic keratosis   . Basal cell carcinoma 04/07/2010   Right paranasal.   . Diabetes mellitus without complication ([place])     There are no problems to display for this patient.   History reviewed. No pertinent surgical history.  Prior to Admission medications   Medication Sig Start Date End Date Taking? Authorizing Provider  amLODipine (NORVASC) 5 MG tablet Take by mouth. 02/19/20   [provider]  aspirin 81 MG EC tablet Take by mouth.    [provider]  atorvastatin (LIPITOR) 10 MG tablet Take 1 tablet by mouth daily. 12/22/19   [provider]  carvedilol (COREG) 25 MG tablet Take by mouth. 02/19/20   [provider]   Cholecalciferol 50 MCG (2000 UT) CAPS Take by mouth.    [provider]  colchicine 0.6 MG tablet Take 2 tablets (1.2mg ) by mouth at first sign of gout flare followed by 1 tablet (0.6mg ) after 1 hour. (Max 1.8mg  within 1 hour) 02/19/20   [provider]  furosemide (LASIX) 20 MG tablet TAKE ONE TABLET BY MOUTH DAILY AS NEEDED FOR EDEMA 12/04/19   [provider]  gabapentin (NEURONTIN) 300 MG capsule Take 1 capsule by mouth 3 (three) times daily. 02/06/20   [provider]  losartan-hydrochlorothiazide (HYZAAR) 100-25 MG tablet Take 1 tablet by mouth daily. 12/22/19   [provider]  metFORMIN (GLUCOPHAGE-XR) 500 MG 24 hr tablet TAKE THREE TABLETS BY MOUTH DAILY WITH DINNER 03/22/20   [provider]  Multiple Vitamin (MULTI-VITAMIN) tablet Take 1 tablet by mouth daily.    [provider]  pantoprazole (PROTONIX) 40 MG tablet Take 1 tablet by mouth daily. 04/02/20   [provider]  potassium chloride (KLOR-CON) 10 MEQ tablet Take 1 tablet by mouth daily. 12/22/19   [provider]  triamcinolone cream (KENALOG) 0.1 % Apply topically. 05/17/18   [provider]  triamcinolone cream (KENALOG) 0.1 % Apply to itchy rash on body 1-2 times daily until improved. Avoid face, groin, underarms. 05/21/20   Brendolyn Patty, MD    Allergies Patient has no known allergies.  Family History  Problem  Relation Age of Onset  . Breast cancer Other     Social History Social History   Tobacco Use  . Smoking status: Never Smoker  . Smokeless tobacco: Never Used  Substance Use Topics  . Alcohol use: Never  . Drug use: Never    Review of Systems Constitutional: No fever/chills Eyes: No visual changes. ENT: No sore throat. Cardiovascular: Denies chest pain. Respiratory: Denies shortness of breath. Gastrointestinal: No abdominal pain.  No nausea, no vomiting.  No diarrhea.  No constipation. Genitourinary: Negative for  dysuria. Musculoskeletal: +right leg pain, Negative for back pain. Skin: Negative for rash. Neurological: Negative for headaches, focal weakness or numbness.  ____________________________________________   PHYSICAL EXAM:  VITAL SIGNS: ED Triage Vitals  Enc Vitals Group     BP 12/13/20 1658 136/74     Pulse Rate 12/13/20 1658 62     Resp 12/13/20 1658 18     Temp 12/13/20 1658 99.2 F (37.3 C)     Temp Source 12/13/20 1658 Oral     SpO2 12/13/20 1658 96 %     Weight 12/13/20 1722 145 lb (65.8 kg)     Height 12/13/20 1722 5\' 2"  (1.575 m)     Head Circumference --      Peak Flow --      Pain Score 12/13/20 1659 6     Pain Loc --      Pain Edu? --      Excl. in [place]? --    Constitutional: Alert and oriented. Well appearing and in no acute distress. Eyes: Conjunctivae are normal. PERRL. EOMI. Head: Atraumatic. Nose: No congestion/rhinnorhea. Mouth/Throat: Mucous membranes are moist.  Oropharynx non-erythematous. Neck: No stridor.  Cardiovascular: Normal rate, regular rhythm. Grossly normal heart sounds.  Good peripheral circulation. Respiratory: Normal respiratory effort.  No retractions. Lungs CTAB. Gastrointestinal: Soft and nontender. No distention. No abdominal bruits. No CVA tenderness. Musculoskeletal: There is tenderness to palpation of the medial aspect of the right calf. No erythema, ecchymosis or swelling noted to the calf. Compartments are soft. Mild swelling appreciated about the right knee. ROM equal to contralateral side of knee. Negative hip log roll. Negative thompsons. Patient able to actively dorsiflex and plantarflex the right ankle with 5/5 strength. Color normal, dorsal pedal pulses 2+, capillary refill brisk. Neurologic:  Normal speech and language. No gross focal neurologic deficits are appreciated. No gait instability. Skin:  Skin is warm, dry and intact. No rash noted. Psychiatric: Mood and affect are normal. Speech and behavior are  normal.  ____________________________________________  RADIOLOGY I, Marlana Salvage, personally viewed and evaluated these images (plain radiographs) as part of my medical decision making, as well as reviewing the written report by the radiologist.  ED provider interpretation:  XR of the right hip demonstrates osteoarthritic changes without any acute fracture  Official radiology report(s): US Venous Img Lower Unilateral Right (DVT)  Result Date: 12/13/2020 CLINICAL DATA:  Right leg pain. EXAM: RIGHT LOWER EXTREMITY VENOUS DOPPLER ULTRASOUND TECHNIQUE: Gray-scale sonography with compression, as well as color and duplex ultrasound, were performed to evaluate the deep venous system(s) from the level of the common femoral vein through the popliteal and proximal calf veins. COMPARISON:  None. FINDINGS: VENOUS Normal compressibility of the common femoral, superficial femoral, and popliteal veins, as well as the visualized calf veins. Visualized portions of profunda femoral vein and great saphenous vein unremarkable. No filling defects to suggest DVT on grayscale or color Doppler imaging. Doppler waveforms show normal direction of venous flow, normal  respiratory plasticity and response to augmentation. Limited views of the contralateral common femoral vein are unremarkable. OTHER None. Limitations: none IMPRESSION: No evidence of right lower extremity DVT. Electronically Signed   By: Keith Rake M.D.   On: 12/13/2020 18:33   DG Knee Complete 4 Views Right  Result Date: 12/13/2020 CLINICAL DATA:  Right knee pain. EXAM: RIGHT KNEE - COMPLETE 4+ VIEW COMPARISON:  None. FINDINGS: No evidence of fracture, dislocation, or joint effusion. Tricompartmental osteoarthritis with peripheral spurring. Medial and lateral tibiofemoral chondrocalcinosis. Suggestion of mild capsular calcification. Mild generalized soft tissue edema. IMPRESSION: 1. Tricompartmental osteoarthritis with chondrocalcinosis. No acute osseous  abnormality. 2. Generalized soft tissue edema. Electronically Signed   By: Keith Rake M.D.   On: 12/13/2020 18:11   DG Hip Unilat W or Wo Pelvis 2-3 Views Right  Result Date: 12/13/2020 CLINICAL DATA:  Right leg pain. EXAM: DG HIP (WITH OR WITHOUT PELVIS) 2-3V RIGHT COMPARISON:  None. FINDINGS: Mild right hip joint space narrowing. Moderate acetabular spurring, medial femoral head spurring. Femoral head is well seated. No evidence of fracture, avascular necrosis or focal lesion. Pubic rami and bony pelvis are intact. Degenerative change of the pubic symphysis and sacroiliac joints which remain congruent. Soft tissue calcifications adjacent to the right greater trochanter may be enthesopathic. Bilateral ischial enthesopathy. IMPRESSION: 1. Mild to moderate right hip osteoarthritis. 2. Degenerative change of the pubic symphysis and sacroiliac joints. Electronically Signed   By: Keith Rake M.D.   On: 12/13/2020 18:13    ____________________________________________   INITIAL IMPRESSION / ASSESSMENT AND PLAN / ED COURSE  As part of my medical decision making, I reviewed the following data within the [facility] notes reviewed and incorporated, Radiograph reviewed and Notes from prior ED visits        Patient is a 87 yo female who presents to the ER for evaluation of right LE injury 2 days ago when getting out of bed. See HPI for further details. On PE, she has tenderness to medial calf and mild swelling about right knee, but ROM of the knee and ankle are WNL and she is neurovascularly intact. XR was obtained of knee and hip which are negative. DVT study was performed and negative for acute DVT. Suspect musculoskeletal cause of pain. Will initiate trial of topical voltaren gel due to patient having preexisting CKD as well as lidocaine patches and tylenol. Will defer use of muscle relaxers at this time given increased fall risk. Patient is amenable with this plan. She is  stable at this time for outpatient follow up. Return precautions were discussed.       ____________________________________________   FINAL CLINICAL IMPRESSION(S) / ED DIAGNOSES  Final diagnoses:  Right leg pain     ED Discharge Orders    None      *Please note:  Tracy Spears was evaluated in Emergency Department on 12/13/2020 for the symptoms described in the history of present illness. She was evaluated in the context of the global COVID-19 pandemic, which necessitated consideration that the patient might be at risk for infection with the SARS-CoV-2 virus that causes COVID-19. Institutional protocols and algorithms that pertain to the evaluation of patients at risk for COVID-19 are in a state of rapid change based on information released by regulatory bodies including the CDC and federal and state organizations. These policies and algorithms were followed during the patient's care in the ED.  Some ED evaluations and interventions may be delayed as a result  of limited staffing during and the pandemic.*   Note:  This document was prepared using Dragon voice recognition software and may include unintentional dictation errors.   Marlana Salvage, PA 12/14/20 1516    Carrie Mew, MD 12/15/20 0001

## 2020-12-13 NOTE — ED Triage Notes (Signed)
Patient c/o right leg pain that started while getting into bed two nights ago. Denies injury. Ambulatory with a cane but painful

## 2020-12-13 NOTE — ED Notes (Signed)
See triage note  Presents with right lower leg pain  States pain is from knee to posterior lower leg  Denies any injury

## 2021-02-17 DIAGNOSIS — E1122 Type 2 diabetes mellitus with diabetic chronic kidney disease: Secondary | ICD-10-CM | POA: Diagnosis not present

## 2021-02-17 DIAGNOSIS — E1169 Type 2 diabetes mellitus with other specified complication: Secondary | ICD-10-CM | POA: Diagnosis not present

## 2021-02-17 DIAGNOSIS — I10 Essential (primary) hypertension: Secondary | ICD-10-CM | POA: Diagnosis not present

## 2021-02-17 DIAGNOSIS — N1832 Chronic kidney disease, stage 3b: Secondary | ICD-10-CM | POA: Diagnosis not present

## 2021-02-17 DIAGNOSIS — E785 Hyperlipidemia, unspecified: Secondary | ICD-10-CM | POA: Diagnosis not present

## 2021-02-26 DIAGNOSIS — E1169 Type 2 diabetes mellitus with other specified complication: Secondary | ICD-10-CM | POA: Diagnosis not present

## 2021-02-26 DIAGNOSIS — I10 Essential (primary) hypertension: Secondary | ICD-10-CM | POA: Diagnosis not present

## 2021-02-26 DIAGNOSIS — E785 Hyperlipidemia, unspecified: Secondary | ICD-10-CM | POA: Diagnosis not present

## 2021-02-26 DIAGNOSIS — Z0000 Encounter for general adult medical examination without abnormal findings: Secondary | ICD-10-CM | POA: Diagnosis not present

## 2021-02-26 DIAGNOSIS — E1122 Type 2 diabetes mellitus with diabetic chronic kidney disease: Secondary | ICD-10-CM | POA: Diagnosis not present

## 2021-02-26 DIAGNOSIS — Z9989 Dependence on other enabling machines and devices: Secondary | ICD-10-CM | POA: Diagnosis not present

## 2021-02-26 DIAGNOSIS — N1832 Chronic kidney disease, stage 3b: Secondary | ICD-10-CM | POA: Diagnosis not present

## 2021-03-05 DIAGNOSIS — E119 Type 2 diabetes mellitus without complications: Secondary | ICD-10-CM | POA: Diagnosis not present

## 2021-03-09 ENCOUNTER — Other Ambulatory Visit: Payer: Self-pay

## 2021-03-09 ENCOUNTER — Emergency Department: Payer: PPO

## 2021-03-09 ENCOUNTER — Emergency Department
Admission: EM | Admit: 2021-03-09 | Discharge: 2021-03-09 | Disposition: A | Discharge status: Home / Self Care | Payer: PPO | Attending: Emergency Medicine | Admitting: Emergency Medicine

## 2021-03-09 ENCOUNTER — Encounter: Payer: Self-pay | Admitting: Emergency Medicine

## 2021-03-09 DIAGNOSIS — E119 Type 2 diabetes mellitus without complications: Secondary | ICD-10-CM | POA: Diagnosis not present

## 2021-03-09 DIAGNOSIS — Y92009 Unspecified place in unspecified non-institutional (private) residence as the place of occurrence of the external cause: Secondary | ICD-10-CM | POA: Insufficient documentation

## 2021-03-09 DIAGNOSIS — Z85828 Personal history of other malignant neoplasm of skin: Secondary | ICD-10-CM | POA: Diagnosis not present

## 2021-03-09 DIAGNOSIS — Z7984 Long term (current) use of oral hypoglycemic drugs: Secondary | ICD-10-CM | POA: Diagnosis not present

## 2021-03-09 DIAGNOSIS — Z7982 Long term (current) use of aspirin: Secondary | ICD-10-CM | POA: Insufficient documentation

## 2021-03-09 DIAGNOSIS — M25552 Pain in left hip: Secondary | ICD-10-CM | POA: Diagnosis not present

## 2021-03-09 DIAGNOSIS — S72009A Fracture of unspecified part of neck of unspecified femur, initial encounter for closed fracture: Secondary | ICD-10-CM | POA: Diagnosis not present

## 2021-03-09 DIAGNOSIS — W19XXXA Unspecified fall, initial encounter: Secondary | ICD-10-CM

## 2021-03-09 DIAGNOSIS — T1490XA Injury, unspecified, initial encounter: Secondary | ICD-10-CM | POA: Diagnosis not present

## 2021-03-09 DIAGNOSIS — S79912A Unspecified injury of left hip, initial encounter: Secondary | ICD-10-CM | POA: Diagnosis not present

## 2021-03-09 DIAGNOSIS — W01198A Fall on same level from slipping, tripping and stumbling with subsequent striking against other object, initial encounter: Secondary | ICD-10-CM | POA: Insufficient documentation

## 2021-03-09 DIAGNOSIS — I10 Essential (primary) hypertension: Secondary | ICD-10-CM | POA: Diagnosis not present

## 2021-03-09 MED ORDER — ACETAMINOPHEN 500 MG PO TABS
1000.0000 mg | ORAL_TABLET | Freq: Once | ORAL | Status: AC
  Administered 2021-03-09: 1000 mg via ORAL
  Filled 2021-03-09: qty 2

## 2021-03-09 NOTE — ED Notes (Signed)
Pt walked 10 ft and back. She stated that it feels like she pulled a muscle but able to bear weight. She normally walks with a cane but did not have one with her.

## 2021-03-09 NOTE — ED Triage Notes (Signed)
  Patient BIB EMS after fall at home. Patient was moving some things in the house and her shoe got caught and she fell on her L hip.  No dizziness or SOB.  No syncopal symptoms.  Patient was advised by family to stay on the ground til EMS arrived and was able to stand when EMS arrived. Patient has no complaints of pain.

## 2021-03-09 NOTE — ED Provider Notes (Signed)
[facility] Emergency Department Provider Note  ____________________________________________   Event Date/Time   First MD Initiated Contact with Patient 03/09/21 2139     (approximate)  I have reviewed the triage vital signs and the nursing notes.   HISTORY  Chief Complaint Fall    HPI Tracy Spears is a 87 y.o. female with past medical history of diabetes here with fall.  Patient states her foot got stuck on a bench just prior to arrival.  She fell, striking her left hip.  She does not use blood thinners.  Denies any head injury.  No chest pain.  Patient has some mild, aching, cramp-like left lower hip pain.  No pain throughout the other abdomen.  No chest pain.  No shortness of breath.  She has not tried ambulate.  Pain is mild, not specifically worse with any kind of movement.  No distal numbness or weakness.    Past Medical History:  Diagnosis Date   Actinic keratosis    Basal cell carcinoma 04/07/2010   Right paranasal.    Diabetes mellitus without complication ([place])     There are no problems to display for this patient.   History reviewed. No pertinent surgical history.  Prior to Admission medications   Medication Sig Start Date End Date Taking? Authorizing Provider  amLODipine (NORVASC) 5 MG tablet Take by mouth. 02/19/20   [provider]  aspirin 81 MG EC tablet Take by mouth.    [provider]  atorvastatin (LIPITOR) 10 MG tablet Take 1 tablet by mouth daily. 12/22/19   [provider]  carvedilol (COREG) 25 MG tablet Take by mouth. 02/19/20   [provider]  Cholecalciferol 50 MCG (2000 UT) CAPS Take by mouth.    [provider]  colchicine 0.6 MG tablet Take 2 tablets (1.2mg ) by mouth at first sign of gout flare followed by 1 tablet (0.6mg ) after 1 hour. (Max 1.8mg  within 1 hour) 02/19/20   [provider]  diclofenac Sodium (VOLTAREN) 1 % GEL Apply 2 g topically 3 (three) times  daily as needed. 12/13/20   Marlana Salvage, PA  furosemide (LASIX) 20 MG tablet TAKE ONE TABLET BY MOUTH DAILY AS NEEDED FOR EDEMA 12/04/19   [provider]  gabapentin (NEURONTIN) 300 MG capsule Take 1 capsule by mouth 3 (three) times daily. 02/06/20   [provider]  Lidocaine (HM LIDOCAINE PATCH) 4 % PTCH Apply 1 patch topically daily. 12/13/20   Marlana Salvage, PA  losartan-hydrochlorothiazide (HYZAAR) 100-25 MG tablet Take 1 tablet by mouth daily. 12/22/19   [provider]  metFORMIN (GLUCOPHAGE-XR) 500 MG 24 hr tablet TAKE THREE TABLETS BY MOUTH DAILY WITH DINNER 03/22/20   [provider]  Multiple Vitamin (MULTI-VITAMIN) tablet Take 1 tablet by mouth daily.    [provider]  pantoprazole (PROTONIX) 40 MG tablet Take 1 tablet by mouth daily. 04/02/20   [provider]  potassium chloride (KLOR-CON) 10 MEQ tablet Take 1 tablet by mouth daily. 12/22/19   [provider]  triamcinolone cream (KENALOG) 0.1 % Apply topically. 05/17/18   [provider]  triamcinolone cream (KENALOG) 0.1 % Apply to itchy rash on body 1-2 times daily until improved. Avoid face, groin, underarms. 05/21/20   Brendolyn Patty, MD    Allergies Patient has no known allergies.  Family History  Problem Relation Age of Onset   Breast cancer Other     Social History Social History   Tobacco Use  Smoking status: Never   Smokeless tobacco: Never  Substance Use Topics   Alcohol use: Never   Drug use: Never    Review of Systems  Review of Systems  Constitutional:  Negative for chills and fever.  HENT:  Negative for sore throat.   Respiratory:  Negative for shortness of breath.   Cardiovascular:  Negative for chest pain.  Gastrointestinal:  Negative for abdominal pain.  Genitourinary:  Negative for flank pain.  Musculoskeletal:  Positive for gait problem. Negative for neck pain.  Skin:  Negative for rash and wound.   Allergic/Immunologic: Negative for immunocompromised state.  Neurological:  Negative for weakness and numbness.  Hematological:  Does not bruise/bleed easily.  All other systems reviewed and are negative.   ____________________________________________  PHYSICAL EXAM:      VITAL SIGNS: ED Triage Vitals  Enc Vitals Group     BP 03/09/21 2051 (!) 172/61     Pulse Rate 03/09/21 2051 62     Resp 03/09/21 2051 18     Temp 03/09/21 2051 98.7 F (37.1 C)     Temp Source 03/09/21 2051 Oral     SpO2 03/09/21 2051 96 %     Weight 03/09/21 2053 145 lb (65.8 kg)     Height 03/09/21 2053 5\' 2"  (1.575 m)     Head Circumference --      Peak Flow --      Pain Score 03/09/21 2053 0     Pain Loc --      Pain Edu? --      Excl. in [place]? --      Physical Exam Vitals and nursing note reviewed.  Constitutional:      General: She is not in acute distress.    Appearance: She is well-developed.  HENT:     Head: Normocephalic and atraumatic.  Eyes:     Conjunctiva/sclera: Conjunctivae normal.  Cardiovascular:     Rate and Rhythm: Normal rate and regular rhythm.     Heart sounds: Normal heart sounds.  Pulmonary:     Effort: Pulmonary effort is normal. No respiratory distress.     Breath sounds: No wheezing.  Abdominal:     General: There is no distension.  Musculoskeletal:     Cervical back: Neck supple.  Skin:    General: Skin is warm.     Capillary Refill: Capillary refill takes less than 2 seconds.     Findings: No rash.  Neurological:     Mental Status: She is alert and oriented to person, place, and time.     Motor: No abnormal muscle tone.     LOWER EXTREMITY EXAM: LEFT  INSPECTION & PALPATION: Moderate TTP over left lateral hip, with no overt bruising, swelling. ROM not painful. No pain with log roll.  SENSORY: sensation is intact to light touch in:  Superficial peroneal nerve distribution (over dorsum of foot) Deep peroneal nerve distribution (over first dorsal web  space) Sural nerve distribution (over lateral aspect 5th metatarsal) Saphenous nerve distribution (over medial instep)  MOTOR:  + Motor EHL (great toe dorsiflexion) + FHL (great toe plantar flexion)  + TA (ankle dorsiflexion)  + GSC (ankle plantar flexion)  VASCULAR: 2+ dorsalis pedis and posterior tibialis pulses Capillary refill < 2 sec, toes warm and well-perfused  COMPARTMENTS: Soft, warm, well-perfused No pain with passive extension No parethesias  ____________________________________________   LABS (all labs ordered are listed, but only abnormal results are displayed)  Labs Reviewed - No data to display  ____________________________________________  EKG:  ________________________________________  RADIOLOGY All imaging, including plain films, CT scans, and ultrasounds, independently reviewed by me, and interpretations confirmed via formal radiology reads.  ED MD interpretation:   X-ray hip left: No acute fracture, hip osteoarthritis  Official radiology report(s): DG HIP UNILAT WITH PELVIS 2-3 VIEWS LEFT  Result Date: 03/09/2021 CLINICAL DATA:  Post fall with left hip pain. EXAM: DG HIP (WITH OR WITHOUT PELVIS) 2-3V LEFT COMPARISON:  Pelvis or right hip exam 12/13/2020 FINDINGS: No evidence of acute fracture. Femoral head is well seated. The bones are diffusely under mineralized which limits detailed assessment. Pubic rami are intact. Pubic symphysis and sacroiliac joints are congruent with degenerative change. Bilateral hip osteoarthritis. IMPRESSION: 1. No fracture of the pelvis or left hip. 2. Bilateral hip osteoarthritis. Electronically Signed   By: Keith Rake M.D.   On: 03/09/2021 22:29    ____________________________________________  PROCEDURES   Procedure(s) performed (including Critical Care):  Procedures  ____________________________________________  INITIAL IMPRESSION / MDM / [place] / ED COURSE  As part of my medical decision  making, I reviewed the following data within the [place] notes reviewed and incorporated, Old chart reviewed, Notes from prior ED visits, and [place] Controlled Substance Database       *Tracy Spears was evaluated in Emergency Department on 03/10/2021 for the symptoms described in the history of present illness. She was evaluated in the context of the global COVID-19 pandemic, which necessitated consideration that the patient might be at risk for infection with the SARS-CoV-2 virus that causes COVID-19. Institutional protocols and algorithms that pertain to the evaluation of patients at risk for COVID-19 are in a state of rapid change based on information released by regulatory bodies including the CDC and federal and state organizations. These policies and algorithms were followed during the patient's care in the ED.  Some ED evaluations and interventions may be delayed as a result of limited staffing during the pandemic.*     Medical Decision Making:  87 yo F here with mild L hip/pelvic pain after fall. Fall was mechanical and pt was in her usual state of health prior to fall. Imaging is negative. Distal NVI. She is ambulatory with minimal pain. Suspect sprain vs mild contusion. D/c with outpt follow-up. No signs of head, chest, or other trauma.  ____________________________________________  FINAL CLINICAL IMPRESSION(S) / ED DIAGNOSES  Final diagnoses:  Left hip pain  Fall, initial encounter     MEDICATIONS GIVEN DURING THIS VISIT:  Medications  acetaminophen (TYLENOL) tablet 1,000 mg (1,000 mg Oral Given 03/09/21 2203)     ED Discharge Orders     None        Note:  This document was prepared using Dragon voice recognition software and may include unintentional dictation errors.   Duffy Bruce, MD 03/10/21 [phone]

## 2021-03-09 NOTE — Discharge Instructions (Addendum)
I'd recommend taking Tylenol 1000 mg every 6 hours for pain  Use your cane, and ideally a walker, for the next several days

## 2021-05-27 ENCOUNTER — Other Ambulatory Visit: Payer: Self-pay

## 2021-05-27 ENCOUNTER — Ambulatory Visit: Payer: PPO | Admitting: Dermatology

## 2021-05-27 DIAGNOSIS — L578 Other skin changes due to chronic exposure to nonionizing radiation: Secondary | ICD-10-CM

## 2021-05-27 DIAGNOSIS — L300 Nummular dermatitis: Secondary | ICD-10-CM

## 2021-05-27 DIAGNOSIS — D1800 Hemangioma unspecified site: Secondary | ICD-10-CM

## 2021-05-27 DIAGNOSIS — L814 Other melanin hyperpigmentation: Secondary | ICD-10-CM

## 2021-05-27 DIAGNOSIS — L820 Inflamed seborrheic keratosis: Secondary | ICD-10-CM

## 2021-05-27 DIAGNOSIS — D229 Melanocytic nevi, unspecified: Secondary | ICD-10-CM

## 2021-05-27 DIAGNOSIS — Z85828 Personal history of other malignant neoplasm of skin: Secondary | ICD-10-CM

## 2021-05-27 DIAGNOSIS — L821 Other seborrheic keratosis: Secondary | ICD-10-CM | POA: Diagnosis not present

## 2021-05-27 DIAGNOSIS — Z1283 Encounter for screening for malignant neoplasm of skin: Secondary | ICD-10-CM

## 2021-05-27 DIAGNOSIS — Z872 Personal history of diseases of the skin and subcutaneous tissue: Secondary | ICD-10-CM | POA: Diagnosis not present

## 2021-05-27 MED ORDER — TRIAMCINOLONE ACETONIDE 0.1 % EX CREA: TOPICAL_CREAM | CUTANEOUS | 2 refills | Status: DC

## 2021-05-27 NOTE — Progress Notes (Signed)
   Follow-Up Visit   Subjective  Tracy Spears is a 87 y.o. female who presents for the following: Annual Exam (Patient presents for TBSE. She has a few scaly spots on her right temple that are itchy. History of AKs. History of BCC of the right paranasal, 2011.).   The following portions of the chart were reviewed this encounter and updated as appropriate:       Review of Systems:  No other skin or systemic complaints except as noted in HPI or Assessment and Plan.  Objective  Well appearing patient in no apparent distress; mood and affect are within normal limits.  A full examination was performed including scalp, head, eyes, ears, nose, lips, neck, chest, axillae, abdomen, back, buttocks, bilateral upper extremities, bilateral lower extremities, hands, feet, fingers, toes, fingernails, and toenails. All findings within normal limits unless otherwise noted below.  Right and Left Posterior Shoulder Pink scaly patch with excoriations.  Right Temple Erythematous keratotic or waxy stuck-on papule   Assessment & Plan  Skin cancer screening performed today.  Actinic Damage - chronic, secondary to cumulative UV radiation exposure/sun exposure over time - diffuse scaly erythematous macules with underlying dyspigmentation - Recommend daily broad spectrum sunscreen SPF 30+ to sun-exposed areas, reapply every 2 hours as needed.  - Recommend staying in the shade or wearing long sleeves, sun glasses (UVA+UVB protection) and wide brim hats (4-inch brim around the entire circumference of the hat). - Call for new or changing lesions.  Lentigines - Scattered tan macules - Due to sun exposure - Benign-appering, observe - Recommend daily broad spectrum sunscreen SPF 30+ to sun-exposed areas, reapply every 2 hours as needed. - Call for any changes  Hemangiomas - Red papules - Discussed benign nature - Observe - Call for any changes  Seborrheic Keratoses - Stuck-on, waxy, tan-brown  papules and/or plaques  - Benign-appearing - Discussed benign etiology and prognosis. - Observe - Call for any changes  History of Basal Cell Carcinoma of the Skin - No evidence of recurrence today of the right paranasal - Recommend regular full body skin exams - Recommend daily broad spectrum sunscreen SPF 30+ to sun-exposed areas, reapply every 2 hours as needed.  - Call if any new or changing lesions are noted between office visits  Nummular dermatitis Right and Left Posterior Shoulder  Recommend mild soap and moisturizing cream 1-2 times daily.  Gentle skin care handout provided.   Continue TMC 0.1% Cream Apply qd/bid until itchy rash improved and prn flares. Avoid face, groin, axilla.   Related Medications triamcinolone cream (KENALOG) 0.1 % Apply to itchy rash on body 1-2 times daily until improved. Avoid face, groin, underarms.  Inflamed seborrheic keratosis Right Temple  Destruction of lesion - Right Temple  Destruction method: cryotherapy   Informed consent: discussed and consent obtained   Lesion destroyed using liquid nitrogen: Yes   Region frozen until ice ball extended beyond lesion: Yes   Outcome: patient tolerated procedure well with no complications   Post-procedure details: wound care instructions given   Additional details:  Prior to procedure, discussed risks of blister formation, small wound, skin dyspigmentation, or rare scar following cryotherapy. Recommend Vaseline ointment to treated areas while healing.   Return in about 1 year (around 05/27/2022) for TBSE.  IJamesetta Orleans, CMA, am acting as scribe for Brendolyn Patty, MD . Documentation: I have reviewed the above documentation for accuracy and completeness, and I agree with the above.  Brendolyn Patty MD

## 2021-05-27 NOTE — Patient Instructions (Addendum)
Cryotherapy Aftercare  . Wash gently with soap and water everyday.   . Apply Vaseline and Band-Aid daily until healed.    Topical steroids (such as triamcinolone, fluocinolone, fluocinonide, mometasone, clobetasol, halobetasol, betamethasone, hydrocortisone) can cause thinning and lightening of the skin if they are used for too long in the same area. Your physician has selected the right strength medicine for your problem and area affected on the body. Please use your medication only as directed by your physician to prevent side effects.   If you have any questions or concerns for your doctor, please call our main line at [phone] and press option 4 to reach your doctor's medical assistant. If no one answers, please leave a voicemail as directed and we will return your call as soon as possible. Messages left after 4 pm will be answered the following business day.   You may also send us a message via MyChart. We typically respond to MyChart messages within 1-2 business days.  For prescription refills, please ask your pharmacy to contact our office. Our fax number is [phone].  If you have an urgent issue when the clinic is closed that cannot wait until the next business day, you can page your doctor at the number below.    Please note that while we do our best to be available for urgent issues outside of office hours, we are not available 24/7.   If you have an urgent issue and are unable to reach us, you may choose to seek medical care at your doctor's office, retail clinic, urgent care center, or emergency room.  If you have a medical emergency, please immediately call 911 or go to the emergency department.  Pager Numbers  - Dr. Kowalski: [phone]  - Dr. Moye: [phone]  - Dr. Stewart: [phone]  In the event of inclement weather, please call our main line at [phone] for an update on the status of any delays or closures.  Dermatology Medication Tips: Please  keep the boxes that topical medications come in in order to help keep track of the instructions about where and how to use these. Pharmacies typically print the medication instructions only on the boxes and not directly on the medication tubes.   If your medication is too expensive, please contact our office at [phone] option 4 or send us a message through MyChart.   We are unable to tell what your co-pay for medications will be in advance as this is different depending on your insurance coverage. However, we may be able to find a substitute medication at lower cost or fill out paperwork to get insurance to cover a needed medication.   If a prior authorization is required to get your medication covered by your insurance company, please allow us 1-2 business days to complete this process.  Drug prices often vary depending on where the prescription is filled and some pharmacies may offer cheaper prices.  The website www.goodrx.com contains coupons for medications through different pharmacies. The prices here do not account for what the cost may be with help from insurance (it may be cheaper with your insurance), but the website can give you the price if you did not use any insurance.  - You can print the associated coupon and take it with your prescription to the pharmacy.  - You may also stop by our office during regular business hours and pick up a GoodRx coupon card.  - If you need your prescription sent electronically to a different pharmacy, notify our   office through [facility] MyChart or by phone at [phone] option 4.  

## 2021-06-23 DIAGNOSIS — N1832 Chronic kidney disease, stage 3b: Secondary | ICD-10-CM | POA: Diagnosis not present

## 2021-06-23 DIAGNOSIS — E1122 Type 2 diabetes mellitus with diabetic chronic kidney disease: Secondary | ICD-10-CM | POA: Diagnosis not present

## 2021-06-23 DIAGNOSIS — I10 Essential (primary) hypertension: Secondary | ICD-10-CM | POA: Diagnosis not present

## 2021-06-23 DIAGNOSIS — E785 Hyperlipidemia, unspecified: Secondary | ICD-10-CM | POA: Diagnosis not present

## 2021-06-23 DIAGNOSIS — E1169 Type 2 diabetes mellitus with other specified complication: Secondary | ICD-10-CM | POA: Diagnosis not present

## 2021-06-25 DIAGNOSIS — E1169 Type 2 diabetes mellitus with other specified complication: Secondary | ICD-10-CM | POA: Diagnosis not present

## 2021-06-25 DIAGNOSIS — Z23 Encounter for immunization: Secondary | ICD-10-CM | POA: Diagnosis not present

## 2021-06-25 DIAGNOSIS — Z9989 Dependence on other enabling machines and devices: Secondary | ICD-10-CM | POA: Diagnosis not present

## 2021-06-25 DIAGNOSIS — E785 Hyperlipidemia, unspecified: Secondary | ICD-10-CM | POA: Diagnosis not present

## 2021-06-25 DIAGNOSIS — N1832 Chronic kidney disease, stage 3b: Secondary | ICD-10-CM | POA: Diagnosis not present

## 2021-06-25 DIAGNOSIS — E1122 Type 2 diabetes mellitus with diabetic chronic kidney disease: Secondary | ICD-10-CM | POA: Diagnosis not present

## 2021-06-25 DIAGNOSIS — I10 Essential (primary) hypertension: Secondary | ICD-10-CM | POA: Diagnosis not present

## 2021-07-16 ENCOUNTER — Other Ambulatory Visit: Payer: Self-pay | Admitting: Internal Medicine

## 2021-07-16 DIAGNOSIS — Z1231 Encounter for screening mammogram for malignant neoplasm of breast: Secondary | ICD-10-CM

## 2021-08-22 ENCOUNTER — Ambulatory Visit
Admission: RE | Admit: 2021-08-22 | Discharge: 2021-08-22 | Disposition: A | Discharge status: Home / Self Care | Payer: PPO | Source: Ambulatory Visit | Attending: Internal Medicine | Admitting: Internal Medicine

## 2021-08-22 ENCOUNTER — Other Ambulatory Visit: Payer: Self-pay

## 2021-08-22 DIAGNOSIS — Z1231 Encounter for screening mammogram for malignant neoplasm of breast: Secondary | ICD-10-CM | POA: Diagnosis not present

## 2021-10-20 DIAGNOSIS — E1169 Type 2 diabetes mellitus with other specified complication: Secondary | ICD-10-CM | POA: Diagnosis not present

## 2021-10-20 DIAGNOSIS — N1832 Chronic kidney disease, stage 3b: Secondary | ICD-10-CM | POA: Diagnosis not present

## 2021-10-20 DIAGNOSIS — E1122 Type 2 diabetes mellitus with diabetic chronic kidney disease: Secondary | ICD-10-CM | POA: Diagnosis not present

## 2021-10-20 DIAGNOSIS — E785 Hyperlipidemia, unspecified: Secondary | ICD-10-CM | POA: Diagnosis not present

## 2021-10-20 DIAGNOSIS — I10 Essential (primary) hypertension: Secondary | ICD-10-CM | POA: Diagnosis not present

## 2021-10-27 DIAGNOSIS — E1169 Type 2 diabetes mellitus with other specified complication: Secondary | ICD-10-CM | POA: Diagnosis not present

## 2021-10-27 DIAGNOSIS — E1122 Type 2 diabetes mellitus with diabetic chronic kidney disease: Secondary | ICD-10-CM | POA: Diagnosis not present

## 2021-10-27 DIAGNOSIS — E785 Hyperlipidemia, unspecified: Secondary | ICD-10-CM | POA: Diagnosis not present

## 2021-10-27 DIAGNOSIS — N1832 Chronic kidney disease, stage 3b: Secondary | ICD-10-CM | POA: Diagnosis not present

## 2021-10-27 DIAGNOSIS — I10 Essential (primary) hypertension: Secondary | ICD-10-CM | POA: Diagnosis not present

## 2021-11-30 IMAGING — CR DG HIP (WITH OR WITHOUT PELVIS) 2-3V*L*
3 series · 3 of 3 positions shown · non-contrast
Comparison: Pelvis or right hip exam 12/13/2020

CLINICAL DATA: Post fall with left hip pain.

EXAM:
DG HIP (WITH OR WITHOUT PELVIS) 2-3V LEFT

[pelvis ap]
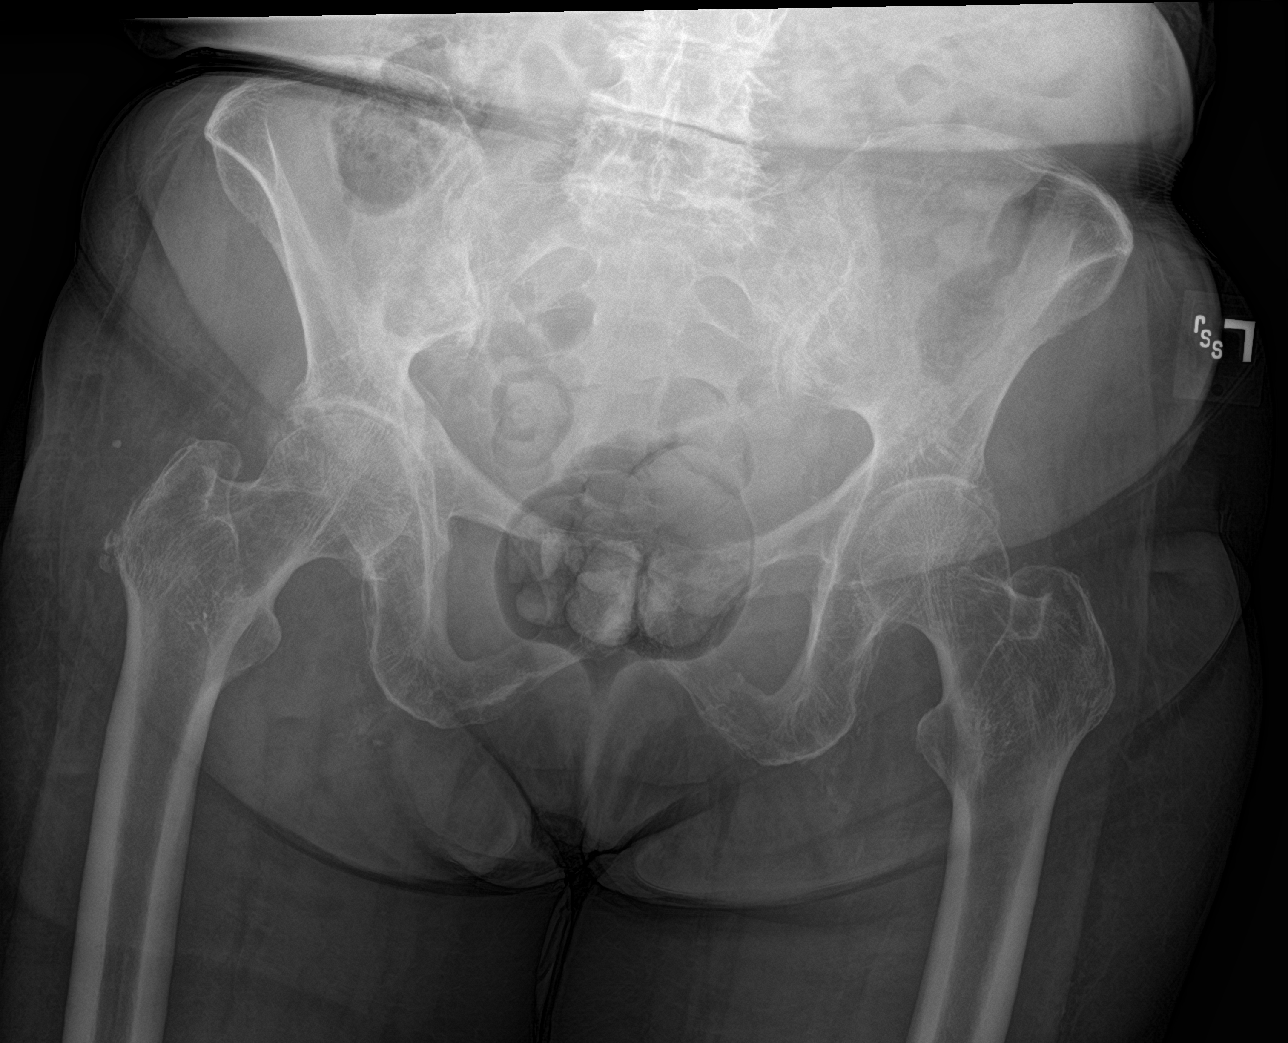

[hip ap]
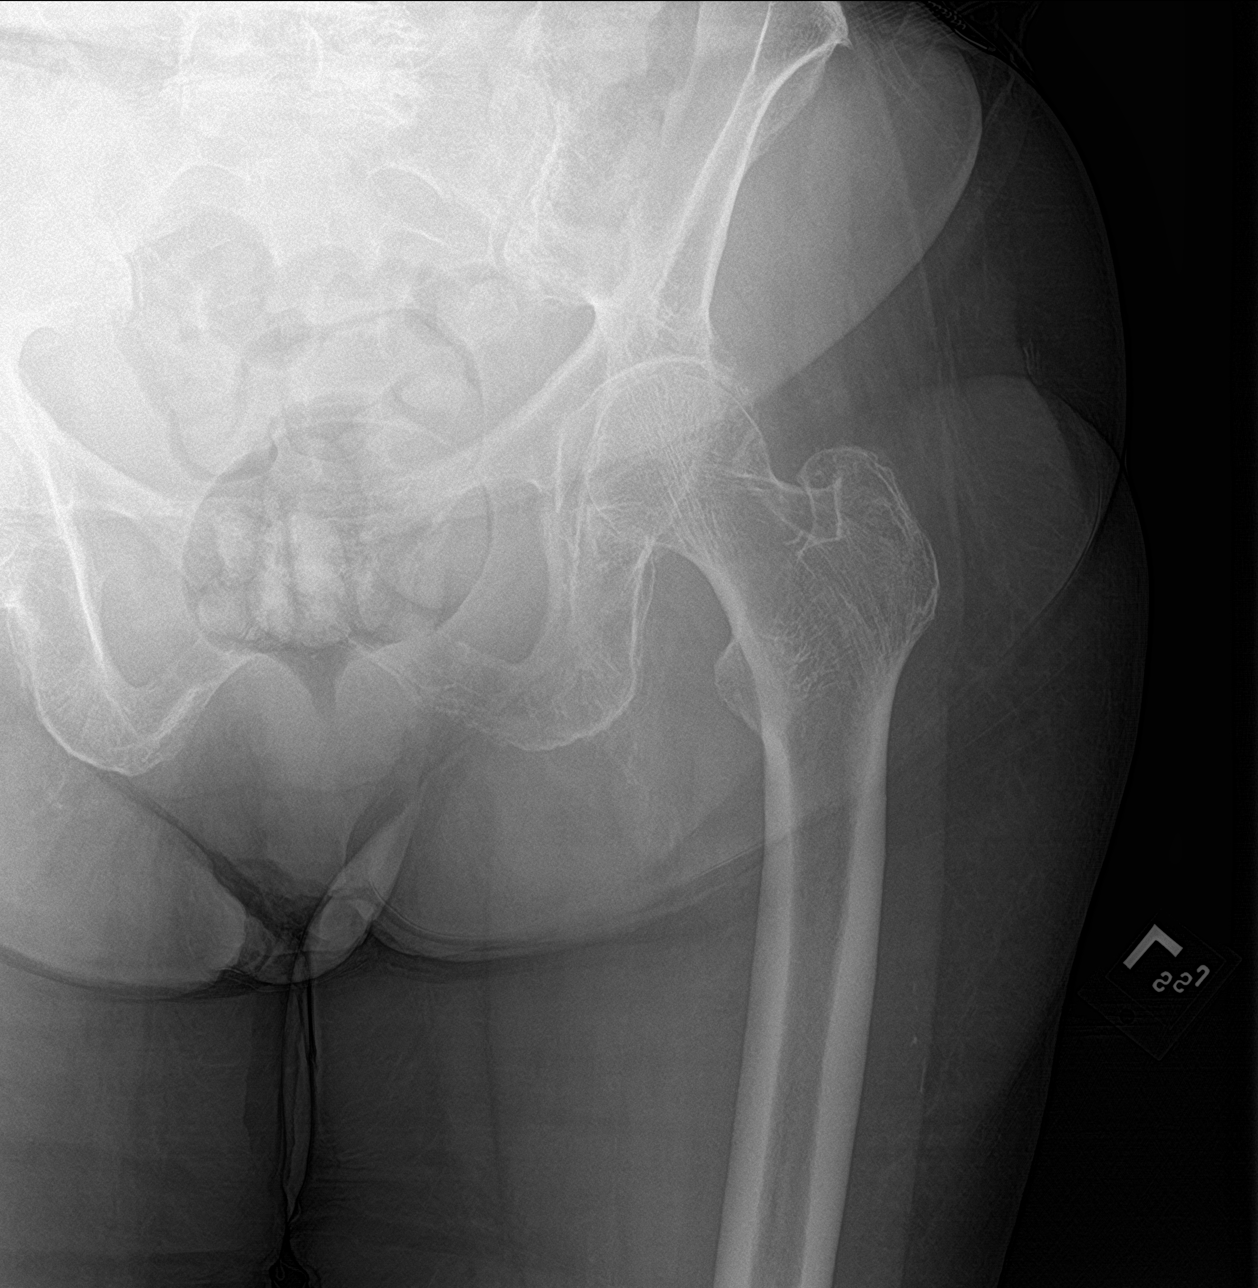

[hip lat]
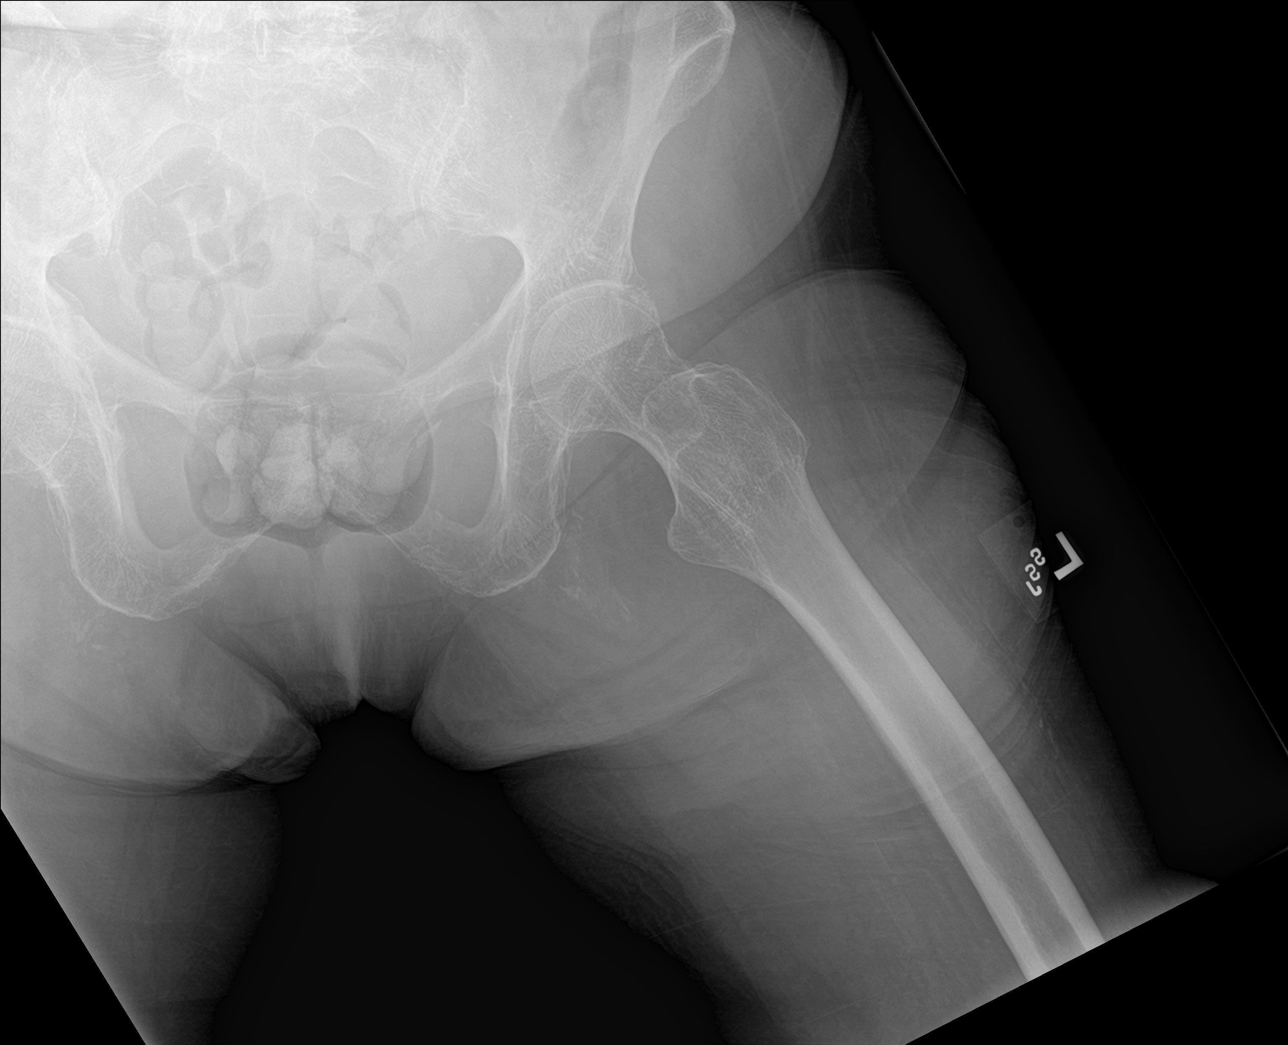

[3 of 3 positions shown; findings below may reference images not displayed]

FINDINGS: No evidence of acute fracture. Femoral head is well seated. The
bones are diffusely under mineralized which limits detailed
assessment. Pubic rami are intact. Pubic symphysis and sacroiliac
joints are congruent with degenerative change. Bilateral hip
osteoarthritis.
IMPRESSION: 1. No fracture of the pelvis or left hip.
2. Bilateral hip osteoarthritis.

## 2022-02-23 DIAGNOSIS — E1122 Type 2 diabetes mellitus with diabetic chronic kidney disease: Secondary | ICD-10-CM | POA: Diagnosis not present

## 2022-02-23 DIAGNOSIS — N1832 Chronic kidney disease, stage 3b: Secondary | ICD-10-CM | POA: Diagnosis not present

## 2022-02-23 DIAGNOSIS — I10 Essential (primary) hypertension: Secondary | ICD-10-CM | POA: Diagnosis not present

## 2022-02-23 DIAGNOSIS — E785 Hyperlipidemia, unspecified: Secondary | ICD-10-CM | POA: Diagnosis not present

## 2022-02-23 DIAGNOSIS — E1169 Type 2 diabetes mellitus with other specified complication: Secondary | ICD-10-CM | POA: Diagnosis not present

## 2022-03-02 DIAGNOSIS — N1832 Chronic kidney disease, stage 3b: Secondary | ICD-10-CM | POA: Diagnosis not present

## 2022-03-02 DIAGNOSIS — M79671 Pain in right foot: Secondary | ICD-10-CM | POA: Diagnosis not present

## 2022-03-02 DIAGNOSIS — E1169 Type 2 diabetes mellitus with other specified complication: Secondary | ICD-10-CM | POA: Diagnosis not present

## 2022-03-02 DIAGNOSIS — I10 Essential (primary) hypertension: Secondary | ICD-10-CM | POA: Diagnosis not present

## 2022-03-02 DIAGNOSIS — Z0000 Encounter for general adult medical examination without abnormal findings: Secondary | ICD-10-CM | POA: Diagnosis not present

## 2022-03-02 DIAGNOSIS — E785 Hyperlipidemia, unspecified: Secondary | ICD-10-CM | POA: Diagnosis not present

## 2022-03-02 DIAGNOSIS — E1122 Type 2 diabetes mellitus with diabetic chronic kidney disease: Secondary | ICD-10-CM | POA: Diagnosis not present

## 2022-03-20 DIAGNOSIS — S92511D Displaced fracture of proximal phalanx of right lesser toe(s), subsequent encounter for fracture with routine healing: Secondary | ICD-10-CM | POA: Diagnosis not present

## 2022-03-20 DIAGNOSIS — E119 Type 2 diabetes mellitus without complications: Secondary | ICD-10-CM | POA: Diagnosis not present

## 2022-03-20 DIAGNOSIS — L600 Ingrowing nail: Secondary | ICD-10-CM | POA: Diagnosis not present

## 2022-03-20 DIAGNOSIS — B351 Tinea unguium: Secondary | ICD-10-CM | POA: Diagnosis not present

## 2022-03-20 DIAGNOSIS — M19071 Primary osteoarthritis, right ankle and foot: Secondary | ICD-10-CM | POA: Diagnosis not present

## 2022-03-20 DIAGNOSIS — M79671 Pain in right foot: Secondary | ICD-10-CM | POA: Diagnosis not present

## 2022-06-02 ENCOUNTER — Ambulatory Visit: Payer: PPO | Admitting: Dermatology

## 2022-06-02 DIAGNOSIS — Z1283 Encounter for screening for malignant neoplasm of skin: Secondary | ICD-10-CM

## 2022-06-02 DIAGNOSIS — L814 Other melanin hyperpigmentation: Secondary | ICD-10-CM

## 2022-06-02 DIAGNOSIS — L578 Other skin changes due to chronic exposure to nonionizing radiation: Secondary | ICD-10-CM

## 2022-06-02 DIAGNOSIS — D229 Melanocytic nevi, unspecified: Secondary | ICD-10-CM

## 2022-06-02 DIAGNOSIS — L820 Inflamed seborrheic keratosis: Secondary | ICD-10-CM | POA: Diagnosis not present

## 2022-06-02 DIAGNOSIS — L821 Other seborrheic keratosis: Secondary | ICD-10-CM

## 2022-06-02 DIAGNOSIS — D1801 Hemangioma of skin and subcutaneous tissue: Secondary | ICD-10-CM | POA: Diagnosis not present

## 2022-06-02 DIAGNOSIS — L300 Nummular dermatitis: Secondary | ICD-10-CM

## 2022-06-02 DIAGNOSIS — Z85828 Personal history of other malignant neoplasm of skin: Secondary | ICD-10-CM | POA: Diagnosis not present

## 2022-06-02 MED ORDER — TRIAMCINOLONE ACETONIDE 0.1 % EX CREA: TOPICAL_CREAM | CUTANEOUS | 2 refills | Status: DC

## 2022-06-02 NOTE — Progress Notes (Signed)
Follow-Up Visit   Subjective  Tracy Spears is a 88 y.o. female who presents for the following: Annual Exam (Tbse. Hx of nummular dermatitis , rough spot at right neck. ). Gets irritated.  The patient presents for Total-Body Skin Exam (TBSE) for skin cancer screening and mole check.  The patient has spots, moles and lesions to be evaluated, some may be new or changing and the patient has concerns that these could be cancer.   The following portions of the chart were reviewed this encounter and updated as appropriate:      Review of Systems: No other skin or systemic complaints except as noted in HPI or Assessment and Plan.   Objective  Well appearing patient in no apparent distress; mood and affect are within normal limits.  A full examination was performed including scalp, head, eyes, ears, nose, lips, neck, chest, axillae, abdomen, back, buttocks, bilateral upper extremities, bilateral lower extremities, hands, feet, fingers, toes, fingernails, and toenails. All findings within normal limits unless otherwise noted below.  b/l posterior shoulders scaly pink excoriations at upper posterior shoulder   right neck x 1, left malar cheek x 1 (2) Erythematous stuck-on, waxy papule or plaque   Assessment & Plan  Nummular dermatitis b/l posterior shoulders  Chronic and persistent condition with duration or expected duration over one year. Condition is bothersome/symptomatic for patient. Mild flare   Recommend mild soap and moisturizing cream 1-2 times daily.  Gentle skin care handout provided.    Continue TMC 0.1% Cream Apply qd/bid until itchy rash improved and prn flares. Avoid face, groin, axilla.   Topical steroids (such as triamcinolone, fluocinolone, fluocinonide, mometasone, clobetasol, halobetasol, betamethasone, hydrocortisone) can cause thinning and lightening of the skin if they are used for too long in the same area. Your physician has selected the right strength  medicine for your problem and area affected on the body. Please use your medication only as directed by your physician to prevent side effects.    triamcinolone cream (KENALOG) 0.1 % - b/l posterior shoulders Apply to itchy rash on body 1-2 times daily until improved. Avoid face, groin, underarms.  Inflamed seborrheic keratosis (2) right neck x 1, left malar cheek x 1  Symptomatic, irritating, patient would like treated.  Patient deferred treatment at right lower pretibia will just observe today.   Destruction of lesion - right neck x 1, left malar cheek x 1  Destruction method: cryotherapy   Informed consent: discussed and consent obtained   Lesion destroyed using liquid nitrogen: Yes   Region frozen until ice ball extended beyond lesion: Yes   Outcome: patient tolerated procedure well with no complications   Post-procedure details: wound care instructions given   Additional details:  Prior to procedure, discussed risks of blister formation, small wound, skin dyspigmentation, or rare scar following cryotherapy. Recommend Vaseline ointment to treated areas while healing.   Lentigines - Scattered tan macules - Due to sun exposure - Benign-appearing, observe - Recommend daily broad spectrum sunscreen SPF 30+ to sun-exposed areas, reapply every 2 hours as needed. - Call for any changes  Seborrheic Keratoses - Stuck-on, waxy, tan-brown papules and/or plaques  - Benign-appearing - Discussed benign etiology and prognosis. - Observe - Call for any changes  Melanocytic Nevi - Tan-brown and/or pink-flesh-colored symmetric macules and papules - Benign appearing on exam today - Observation - Call clinic for new or changing moles - Recommend daily use of broad spectrum spf 30+ sunscreen to sun-exposed areas.   Hemangiomas -  Red papules - Discussed benign nature - Observe - Call for any changes  Actinic Damage - Chronic condition, secondary to cumulative UV/sun exposure -  diffuse scaly erythematous macules with underlying dyspigmentation - Recommend daily broad spectrum sunscreen SPF 30+ to sun-exposed areas, reapply every 2 hours as needed.  - Staying in the shade or wearing long sleeves, sun glasses (UVA+UVB protection) and wide brim hats (4-inch brim around the entire circumference of the hat) are also recommended for sun protection.  - Call for new or changing lesions.  History of Basal Cell Carcinoma of the Skin at right paranasal  2011 - No evidence of recurrence today - Recommend regular full body skin exams - Recommend daily broad spectrum sunscreen SPF 30+ to sun-exposed areas, reapply every 2 hours as needed.  - Call if any new or changing lesions are noted between office visits  Skin cancer screening performed today. Return in about 1 year (around 06/03/2023) for TBSE. I, Ruthell Rummage, CMA, am acting as scribe for Brendolyn Patty, MD.  Documentation: I have reviewed the above documentation for accuracy and completeness, and I agree with the above.  Brendolyn Patty MD

## 2022-06-02 NOTE — Patient Instructions (Addendum)
Seborrheic Keratosis  What causes seborrheic keratoses? Seborrheic keratoses are harmless, common skin growths that first appear during adult life.  As time goes by, more growths appear.  Some people may develop a large number of them.  Seborrheic keratoses appear on both covered and uncovered body parts.  They are not caused by sunlight.  The tendency to develop seborrheic keratoses can be inherited.  They vary in color from skin-colored to gray, brown, or even black.  They can be either smooth or have a rough, warty surface.   Seborrheic keratoses are superficial and look as if they were stuck on the skin.  Under the microscope this type of keratosis looks like layers upon layers of skin.  That is why at times the top layer may seem to fall off, but the rest of the growth remains and re-grows.    Treatment Seborrheic keratoses do not need to be treated, but can easily be removed in the office.  Seborrheic keratoses often cause symptoms when they rub on clothing or jewelry.  Lesions can be in the way of shaving.  If they become inflamed, they can cause itching, soreness, or burning.  Removal of a seborrheic keratosis can be accomplished by freezing, burning, or surgery. If any spot bleeds, scabs, or grows rapidly, please return to have it checked, as these can be an indication of a skin cancer.  Cryotherapy Aftercare  Wash gently with soap and water everyday.   Apply Vaseline and Band-Aid daily until healed.       Melanoma ABCDEs  Melanoma is the most dangerous type of skin cancer, and is the leading cause of death from skin disease.  You are more likely to develop melanoma if you: Have light-colored skin, light-colored eyes, or red or blond hair Spend a lot of time in the sun Tan regularly, either outdoors or in a tanning bed Have had blistering sunburns, especially during childhood Have a close family member who has had a melanoma Have atypical moles or large birthmarks  Early  detection of melanoma is key since treatment is typically straightforward and cure rates are extremely high if we catch it early.   The first sign of melanoma is often a change in a mole or a new dark spot.  The ABCDE system is a way of remembering the signs of melanoma.  A for asymmetry:  The two halves do not match. B for border:  The edges of the growth are irregular. C for color:  A mixture of colors are present instead of an even brown color. D for diameter:  Melanomas are usually (but not always) greater than 6mm - the size of a pencil eraser. E for evolution:  The spot keeps changing in size, shape, and color.  Please check your skin once per month between visits. You can use a small mirror in front and a large mirror behind you to keep an eye on the back side or your body.   If you see any new or changing lesions before your next follow-up, please call to schedule a visit.  Please continue daily skin protection including broad spectrum sunscreen SPF 30+ to sun-exposed areas, reapplying every 2 hours as needed when you're outdoors.   Staying in the shade or wearing long sleeves, sun glasses (UVA+UVB protection) and wide brim hats (4-inch brim around the entire circumference of the hat) are also recommended for sun protection.     Due to recent changes in healthcare laws, you may see results of   your pathology and/or laboratory studies on MyChart before the doctors have had a chance to review them. We understand that in some cases there may be results that are confusing or concerning to you. Please understand that not all results are received at the same time and often the doctors may need to interpret multiple results in order to provide you with the best plan of care or course of treatment. Therefore, we ask that you please give us 2 business days to thoroughly review all your results before contacting the office for clarification. Should we see a critical lab result, you will be contacted  sooner.   If You Need Anything After Your Visit  If you have any questions or concerns for your doctor, please call our main line at [phone] and press option 4 to reach your doctor's medical assistant. If no one answers, please leave a voicemail as directed and we will return your call as soon as possible. Messages left after 4 pm will be answered the following business day.   You may also send us a message via MyChart. We typically respond to MyChart messages within 1-2 business days.  For prescription refills, please ask your pharmacy to contact our office. Our fax number is [phone].  If you have an urgent issue when the clinic is closed that cannot wait until the next business day, you can page your doctor at the number below.    Please note that while we do our best to be available for urgent issues outside of office hours, we are not available 24/7.   If you have an urgent issue and are unable to reach us, you may choose to seek medical care at your doctor's office, retail clinic, urgent care center, or emergency room.  If you have a medical emergency, please immediately call 911 or go to the emergency department.  Pager Numbers  - Dr. Kowalski: [phone]  - Dr. Moye: [phone]  - Dr. Stewart: [phone]  In the event of inclement weather, please call our main line at [phone] for an update on the status of any delays or closures.  Dermatology Medication Tips: Please keep the boxes that topical medications come in in order to help keep track of the instructions about where and how to use these. Pharmacies typically print the medication instructions only on the boxes and not directly on the medication tubes.   If your medication is too expensive, please contact our office at [phone] option 4 or send us a message through MyChart.   We are unable to tell what your co-pay for medications will be in advance as this is different depending on your insurance  coverage. However, we may be able to find a substitute medication at lower cost or fill out paperwork to get insurance to cover a needed medication.   If a prior authorization is required to get your medication covered by your insurance company, please allow us 1-2 business days to complete this process.  Drug prices often vary depending on where the prescription is filled and some pharmacies may offer cheaper prices.  The website www.goodrx.com contains coupons for medications through different pharmacies. The prices here do not account for what the cost may be with help from insurance (it may be cheaper with your insurance), but the website can give you the price if you did not use any insurance.  - You can print the associated coupon and take it with your prescription to the pharmacy.  - You may also stop   by our office during regular business hours and pick up a GoodRx coupon card.  - If you need your prescription sent electronically to a different pharmacy, notify our office through [place] [facility] MyChart or by phone at [phone] option 4.     Si Usted Necesita Algo Despus de Su Visita  Tambin puede enviarnos un mensaje a travs de MyChart. Por lo general respondemos a los mensajes de MyChart en el transcurso de 1 a 2 das hbiles.  Para renovar recetas, por favor pida a su farmacia que se ponga en contacto con nuestra oficina. Nuestro nmero de fax es el [phone].  Si tiene un asunto urgente cuando la clnica est cerrada y que no puede esperar hasta el siguiente da hbil, puede llamar/localizar a su doctor(a) al nmero que aparece a continuacin.   Por favor, tenga en cuenta que aunque hacemos todo lo posible para estar disponibles para asuntos urgentes fuera del horario de oficina, no estamos disponibles las 24 horas del da, los 7 das de la semana.   Si tiene un problema urgente y no puede comunicarse con nosotros, puede optar por buscar atencin mdica  en el consultorio de  su doctor(a), en una clnica privada, en un centro de atencin urgente o en una sala de emergencias.  Si tiene una emergencia mdica, por favor llame inmediatamente al 911 o vaya a la sala de emergencias.  Nmeros de bper  - Dr. Kowalski: [phone]  - Dra. Moye: [phone]  - Dra. Stewart: [phone]  En caso de inclemencias del tiempo, por favor llame a nuestra lnea principal al [phone] para una actualizacin sobre el estado de cualquier retraso o cierre.  Consejos para la medicacin en dermatologa: Por favor, guarde las cajas en las que vienen los medicamentos de uso tpico para ayudarle a seguir las instrucciones sobre dnde y cmo usarlos. Las farmacias generalmente imprimen las instrucciones del medicamento slo en las cajas y no directamente en los tubos del medicamento.   Si su medicamento es muy caro, por favor, pngase en contacto con nuestra oficina llamando al [phone] y presione la opcin 4 o envenos un mensaje a travs de MyChart.   No podemos decirle cul ser su copago por los medicamentos por adelantado ya que esto es diferente dependiendo de la cobertura de su seguro. Sin embargo, es posible que podamos encontrar un medicamento sustituto a menor costo o llenar un formulario para que el seguro cubra el medicamento que se considera necesario.   Si se requiere una autorizacin previa para que su compaa de seguros cubra su medicamento, por favor permtanos de 1 a 2 das hbiles para completar este proceso.  Los precios de los medicamentos varan con frecuencia dependiendo del lugar de dnde se surte la receta y alguna farmacias pueden ofrecer precios ms baratos.  El sitio web www.goodrx.com tiene cupones para medicamentos de diferentes farmacias. Los precios aqu no tienen en cuenta lo que podra costar con la ayuda del seguro (puede ser ms barato con su seguro), pero el sitio web puede darle el precio si no utiliz ningn seguro.  - Puede imprimir el  cupn correspondiente y llevarlo con su receta a la farmacia.  - Tambin puede pasar por nuestra oficina durante el horario de atencin regular y recoger una tarjeta de cupones de GoodRx.  - Si necesita que su receta se enve electrnicamente a una farmacia diferente, informe a nuestra oficina a travs de MyChart de [place] o por telfono llamando al [phone] y presione la opcin 4.  

## 2022-06-17 DIAGNOSIS — H2513 Age-related nuclear cataract, bilateral: Secondary | ICD-10-CM | POA: Diagnosis not present

## 2022-06-22 DIAGNOSIS — E1122 Type 2 diabetes mellitus with diabetic chronic kidney disease: Secondary | ICD-10-CM | POA: Diagnosis not present

## 2022-06-22 DIAGNOSIS — N1832 Chronic kidney disease, stage 3b: Secondary | ICD-10-CM | POA: Diagnosis not present

## 2022-06-22 DIAGNOSIS — E1169 Type 2 diabetes mellitus with other specified complication: Secondary | ICD-10-CM | POA: Diagnosis not present

## 2022-06-22 DIAGNOSIS — I10 Essential (primary) hypertension: Secondary | ICD-10-CM | POA: Diagnosis not present

## 2022-06-22 DIAGNOSIS — E785 Hyperlipidemia, unspecified: Secondary | ICD-10-CM | POA: Diagnosis not present

## 2022-06-29 DIAGNOSIS — N1832 Chronic kidney disease, stage 3b: Secondary | ICD-10-CM | POA: Diagnosis not present

## 2022-06-29 DIAGNOSIS — E1122 Type 2 diabetes mellitus with diabetic chronic kidney disease: Secondary | ICD-10-CM | POA: Diagnosis not present

## 2022-06-29 DIAGNOSIS — Z23 Encounter for immunization: Secondary | ICD-10-CM | POA: Diagnosis not present

## 2022-06-29 DIAGNOSIS — E1169 Type 2 diabetes mellitus with other specified complication: Secondary | ICD-10-CM | POA: Diagnosis not present

## 2022-06-29 DIAGNOSIS — E785 Hyperlipidemia, unspecified: Secondary | ICD-10-CM | POA: Diagnosis not present

## 2022-06-29 DIAGNOSIS — I10 Essential (primary) hypertension: Secondary | ICD-10-CM | POA: Diagnosis not present

## 2022-07-16 ENCOUNTER — Other Ambulatory Visit: Payer: Self-pay | Admitting: Internal Medicine

## 2022-07-16 DIAGNOSIS — Z1231 Encounter for screening mammogram for malignant neoplasm of breast: Secondary | ICD-10-CM

## 2022-08-24 ENCOUNTER — Ambulatory Visit
Admission: RE | Admit: 2022-08-24 | Discharge: 2022-08-24 | Disposition: A | Discharge status: Home / Self Care | Payer: PPO | Source: Ambulatory Visit | Attending: Internal Medicine | Admitting: Internal Medicine

## 2022-08-24 DIAGNOSIS — Z1231 Encounter for screening mammogram for malignant neoplasm of breast: Secondary | ICD-10-CM | POA: Insufficient documentation

## 2022-10-26 DIAGNOSIS — I10 Essential (primary) hypertension: Secondary | ICD-10-CM | POA: Diagnosis not present

## 2022-10-26 DIAGNOSIS — E785 Hyperlipidemia, unspecified: Secondary | ICD-10-CM | POA: Diagnosis not present

## 2022-10-26 DIAGNOSIS — E1169 Type 2 diabetes mellitus with other specified complication: Secondary | ICD-10-CM | POA: Diagnosis not present

## 2022-11-02 DIAGNOSIS — I10 Essential (primary) hypertension: Secondary | ICD-10-CM | POA: Diagnosis not present

## 2022-11-02 DIAGNOSIS — E1169 Type 2 diabetes mellitus with other specified complication: Secondary | ICD-10-CM | POA: Diagnosis not present

## 2022-11-02 DIAGNOSIS — E785 Hyperlipidemia, unspecified: Secondary | ICD-10-CM | POA: Diagnosis not present

## 2022-11-02 DIAGNOSIS — N1832 Chronic kidney disease, stage 3b: Secondary | ICD-10-CM | POA: Diagnosis not present

## 2022-11-02 DIAGNOSIS — E1122 Type 2 diabetes mellitus with diabetic chronic kidney disease: Secondary | ICD-10-CM | POA: Diagnosis not present

## 2022-12-30 DIAGNOSIS — R062 Wheezing: Secondary | ICD-10-CM | POA: Diagnosis not present

## 2022-12-30 DIAGNOSIS — J019 Acute sinusitis, unspecified: Secondary | ICD-10-CM | POA: Diagnosis not present

## 2023-01-14 ENCOUNTER — Other Ambulatory Visit: Payer: Self-pay | Admitting: Internal Medicine

## 2023-01-14 ENCOUNTER — Ambulatory Visit
Admission: RE | Admit: 2023-01-14 | Discharge: 2023-01-14 | Disposition: A | Discharge status: Home / Self Care | Payer: PPO | Source: Ambulatory Visit | Attending: Internal Medicine | Admitting: Internal Medicine

## 2023-01-14 ENCOUNTER — Other Ambulatory Visit
Admission: RE | Admit: 2023-01-14 | Discharge: 2023-01-14 | Disposition: A | Discharge status: Home / Self Care | Payer: PPO | Source: Ambulatory Visit | Attending: Internal Medicine | Admitting: Internal Medicine

## 2023-01-14 DIAGNOSIS — R0602 Shortness of breath: Secondary | ICD-10-CM

## 2023-01-14 DIAGNOSIS — J189 Pneumonia, unspecified organism: Secondary | ICD-10-CM | POA: Diagnosis not present

## 2023-01-14 DIAGNOSIS — J9811 Atelectasis: Secondary | ICD-10-CM | POA: Diagnosis not present

## 2023-01-14 DIAGNOSIS — R600 Localized edema: Secondary | ICD-10-CM

## 2023-01-14 DIAGNOSIS — R059 Cough, unspecified: Secondary | ICD-10-CM | POA: Diagnosis not present

## 2023-01-14 DIAGNOSIS — R062 Wheezing: Secondary | ICD-10-CM | POA: Diagnosis not present

## 2023-01-14 LAB — POCT I-STAT CREATININE: Creatinine, Ser: 0.9 mg/dL (ref 0.44–1.00)

## 2023-01-14 LAB — D-DIMER, QUANTITATIVE: D-Dimer, Quant: 2.78 ug/mL-FEU — ABNORMAL HIGH (ref 0.00–0.50)

## 2023-01-14 MED ORDER — IOHEXOL 350 MG/ML SOLN
100.0000 mL | Freq: Once | INTRAVENOUS | Status: AC | PRN
  Administered 2023-01-14: 75 mL via INTRAVENOUS

## 2023-01-18 DIAGNOSIS — D649 Anemia, unspecified: Secondary | ICD-10-CM | POA: Diagnosis not present

## 2023-01-18 DIAGNOSIS — D6489 Other specified anemias: Secondary | ICD-10-CM | POA: Diagnosis not present

## 2023-01-18 DIAGNOSIS — I3139 Other pericardial effusion (noninflammatory): Secondary | ICD-10-CM | POA: Diagnosis not present

## 2023-01-20 ENCOUNTER — Inpatient Hospital Stay (HOSPITAL_COMMUNITY)
Admit: 2023-01-20 | Discharge: 2023-01-20 | Disposition: A | Discharge status: Home / Self Care | Payer: PPO | Attending: Internal Medicine | Admitting: Internal Medicine

## 2023-01-20 ENCOUNTER — Emergency Department: Payer: PPO

## 2023-01-20 ENCOUNTER — Inpatient Hospital Stay
Admission: EM | Admit: 2023-01-20 | Discharge: 2023-01-24 | DRG: 641 | Disposition: A | Discharge status: Home / Self Care | Payer: PPO | Attending: Internal Medicine | Admitting: Internal Medicine

## 2023-01-20 ENCOUNTER — Other Ambulatory Visit: Payer: Self-pay

## 2023-01-20 ENCOUNTER — Encounter: Payer: Self-pay | Admitting: Emergency Medicine

## 2023-01-20 DIAGNOSIS — I517 Cardiomegaly: Secondary | ICD-10-CM | POA: Diagnosis not present

## 2023-01-20 DIAGNOSIS — E1140 Type 2 diabetes mellitus with diabetic neuropathy, unspecified: Secondary | ICD-10-CM | POA: Diagnosis not present

## 2023-01-20 DIAGNOSIS — E785 Hyperlipidemia, unspecified: Secondary | ICD-10-CM | POA: Diagnosis present

## 2023-01-20 DIAGNOSIS — D638 Anemia in other chronic diseases classified elsewhere: Secondary | ICD-10-CM | POA: Diagnosis not present

## 2023-01-20 DIAGNOSIS — R531 Weakness: Secondary | ICD-10-CM

## 2023-01-20 DIAGNOSIS — Z85828 Personal history of other malignant neoplasm of skin: Secondary | ICD-10-CM

## 2023-01-20 DIAGNOSIS — I10 Essential (primary) hypertension: Secondary | ICD-10-CM | POA: Insufficient documentation

## 2023-01-20 DIAGNOSIS — I700 Atherosclerosis of aorta: Secondary | ICD-10-CM | POA: Diagnosis not present

## 2023-01-20 DIAGNOSIS — Z7982 Long term (current) use of aspirin: Secondary | ICD-10-CM | POA: Diagnosis not present

## 2023-01-20 DIAGNOSIS — I3139 Other pericardial effusion (noninflammatory): Secondary | ICD-10-CM | POA: Diagnosis not present

## 2023-01-20 DIAGNOSIS — E876 Hypokalemia: Secondary | ICD-10-CM | POA: Diagnosis present

## 2023-01-20 DIAGNOSIS — E871 Hypo-osmolality and hyponatremia: Secondary | ICD-10-CM | POA: Diagnosis not present

## 2023-01-20 DIAGNOSIS — Z803 Family history of malignant neoplasm of breast: Secondary | ICD-10-CM

## 2023-01-20 DIAGNOSIS — Z7984 Long term (current) use of oral hypoglycemic drugs: Secondary | ICD-10-CM

## 2023-01-20 DIAGNOSIS — K449 Diaphragmatic hernia without obstruction or gangrene: Secondary | ICD-10-CM | POA: Diagnosis present

## 2023-01-20 DIAGNOSIS — Z79899 Other long term (current) drug therapy: Secondary | ICD-10-CM

## 2023-01-20 DIAGNOSIS — R54 Age-related physical debility: Secondary | ICD-10-CM | POA: Diagnosis not present

## 2023-01-20 DIAGNOSIS — K219 Gastro-esophageal reflux disease without esophagitis: Secondary | ICD-10-CM | POA: Diagnosis not present

## 2023-01-20 DIAGNOSIS — E860 Dehydration: Secondary | ICD-10-CM | POA: Diagnosis present

## 2023-01-20 DIAGNOSIS — E119 Type 2 diabetes mellitus without complications: Secondary | ICD-10-CM

## 2023-01-20 LAB — COMPREHENSIVE METABOLIC PANEL
ALT: 19 U/L (ref 0–44)
AST: 38 U/L (ref 15–41)
Albumin: 3.5 g/dL (ref 3.5–5.0)
Alkaline Phosphatase: 57 U/L (ref 38–126)
Anion gap: 11 (ref 5–15)
BUN: 6 mg/dL — ABNORMAL LOW (ref 8–23)
CO2: 25 mmol/L (ref 22–32)
Calcium: 8.5 mg/dL — ABNORMAL LOW (ref 8.9–10.3)
Chloride: 83 mmol/L — ABNORMAL LOW (ref 98–111)
Creatinine, Ser: 0.55 mg/dL (ref 0.44–1.00)
GFR, Estimated: >60 mL/min (ref >60)
Glucose, Bld: 143 mg/dL — ABNORMAL HIGH (ref 70–99)
Potassium: 3.6 mmol/L (ref 3.5–5.1)
Sodium: 119 mmol/L — CL (ref 135–145)
Total Bilirubin: 0.8 mg/dL (ref 0.3–1.2)
Total Protein: 6.8 g/dL (ref 6.5–8.1)

## 2023-01-20 LAB — CBC WITH DIFFERENTIAL/PLATELET
Abs Immature Granulocytes: 0.03 10*3/uL (ref 0.00–0.07)
Basophils Absolute: 0 10*3/uL (ref 0.0–0.1)
Basophils Relative: 0 %
Eosinophils Absolute: 0.1 10*3/uL (ref 0.0–0.5)
Eosinophils Relative: 1 %
HCT: 27.2 % — ABNORMAL LOW (ref 36.0–46.0)
Hemoglobin: 9.2 g/dL — ABNORMAL LOW (ref 12.0–15.0)
Immature Granulocytes: 1 %
Lymphocytes Relative: 21 %
Lymphs Abs: 1.4 10*3/uL (ref 0.7–4.0)
MCH: 25.6 pg — ABNORMAL LOW (ref 26.0–34.0)
MCHC: 33.8 g/dL (ref 30.0–36.0)
MCV: 75.8 fL — ABNORMAL LOW (ref 80.0–100.0)
Monocytes Absolute: 0.5 10*3/uL (ref 0.1–1.0)
Monocytes Relative: 7 %
Neutro Abs: 4.6 10*3/uL (ref 1.7–7.7)
Neutrophils Relative %: 70 %
Platelets: 277 10*3/uL (ref 150–400)
RBC: 3.59 MIL/uL — ABNORMAL LOW (ref 3.87–5.11)
RDW: 16.7 % — ABNORMAL HIGH (ref 11.5–15.5)
WBC: 6.6 10*3/uL (ref 4.0–10.5)
nRBC: 0 % (ref 0.0–0.2)

## 2023-01-20 LAB — SODIUM, URINE, RANDOM: Sodium, Ur: 46 mmol/L

## 2023-01-20 LAB — GLUCOSE, CAPILLARY: Glucose-Capillary: 177 mg/dL — ABNORMAL HIGH (ref 70–99)

## 2023-01-20 LAB — OSMOLALITY, URINE: Osmolality, Ur: 130 mOsm/kg — ABNORMAL LOW (ref 300–900)

## 2023-01-20 LAB — ECHOCARDIOGRAM COMPLETE: Height: 60.236 in

## 2023-01-20 LAB — OSMOLALITY: Osmolality: 252 mOsm/kg — ABNORMAL LOW (ref 275–295)

## 2023-01-20 MED ORDER — METFORMIN HCL ER 750 MG PO TB24: 1500.0000 mg | ORAL_TABLET | Freq: Every day | ORAL | Status: DC

## 2023-01-20 MED ORDER — ACETAMINOPHEN 650 MG RE SUPP: 650.0000 mg | Freq: Four times a day (QID) | RECTAL | Status: DC | PRN

## 2023-01-20 MED ORDER — ONDANSETRON HCL 4 MG PO TABS: 4.0000 mg | ORAL_TABLET | Freq: Four times a day (QID) | ORAL | Status: DC | PRN

## 2023-01-20 MED ORDER — MELATONIN 5 MG PO TABS
5.0000 mg | ORAL_TABLET | Freq: Every evening | ORAL | Status: DC | PRN
  Administered 2023-01-21: 5 mg via ORAL
  Filled 2023-01-20: qty 1

## 2023-01-20 MED ORDER — SENNOSIDES-DOCUSATE SODIUM 8.6-50 MG PO TABS: 1.0000 | ORAL_TABLET | Freq: Every evening | ORAL | Status: DC | PRN

## 2023-01-20 MED ORDER — PANTOPRAZOLE SODIUM 40 MG PO TBEC
40.0000 mg | DELAYED_RELEASE_TABLET | Freq: Every day | ORAL | Status: DC
  Administered 2023-01-20 – 2023-01-24 (×5): 40 mg via ORAL
  Filled 2023-01-20 (×5): qty 1

## 2023-01-20 MED ORDER — INSULIN ASPART 100 UNIT/ML IJ SOLN
0.0000 [IU] | Freq: Three times a day (TID) | INTRAMUSCULAR | Status: DC
  Administered 2023-01-21: 3 [IU] via SUBCUTANEOUS
  Administered 2023-01-22 – 2023-01-23 (×3): 2 [IU] via SUBCUTANEOUS
  Administered 2023-01-23: 1 [IU] via SUBCUTANEOUS
  Administered 2023-01-23: 2 [IU] via SUBCUTANEOUS
  Filled 2023-01-20 (×3): qty 1

## 2023-01-20 MED ORDER — HYDRALAZINE HCL 20 MG/ML IJ SOLN: 5.0000 mg | Freq: Three times a day (TID) | INTRAMUSCULAR | Status: DC | PRN

## 2023-01-20 MED ORDER — ENOXAPARIN SODIUM 40 MG/0.4ML IJ SOSY
40.0000 mg | PREFILLED_SYRINGE | INTRAMUSCULAR | Status: DC
  Administered 2023-01-20 – 2023-01-23 (×4): 40 mg via SUBCUTANEOUS
  Filled 2023-01-20 (×4): qty 0.4

## 2023-01-20 MED ORDER — VITAMIN D 25 MCG (1000 UNIT) PO TABS
2000.0000 [IU] | ORAL_TABLET | Freq: Every day | ORAL | Status: DC
  Administered 2023-01-20 – 2023-01-24 (×4): 2000 [IU] via ORAL
  Filled 2023-01-20 (×5): qty 2

## 2023-01-20 MED ORDER — INSULIN ASPART 100 UNIT/ML IJ SOLN: 0.0000 [IU] | Freq: Every day | INTRAMUSCULAR | Status: DC

## 2023-01-20 MED ORDER — ATORVASTATIN CALCIUM 20 MG PO TABS
10.0000 mg | ORAL_TABLET | Freq: Every day | ORAL | Status: DC
  Administered 2023-01-20 – 2023-01-23 (×4): 10 mg via ORAL
  Filled 2023-01-20 (×4): qty 1

## 2023-01-20 MED ORDER — ACETAMINOPHEN 325 MG PO TABS: 650.0000 mg | ORAL_TABLET | Freq: Four times a day (QID) | ORAL | Status: DC | PRN

## 2023-01-20 MED ORDER — CARVEDILOL 25 MG PO TABS
25.0000 mg | ORAL_TABLET | Freq: Two times a day (BID) | ORAL | Status: DC
  Administered 2023-01-20 – 2023-01-24 (×8): 25 mg via ORAL
  Filled 2023-01-20 (×8): qty 1

## 2023-01-20 MED ORDER — ASPIRIN 81 MG PO TBEC
81.0000 mg | DELAYED_RELEASE_TABLET | Freq: Every day | ORAL | Status: DC
  Administered 2023-01-20 – 2023-01-22 (×3): 81 mg via ORAL
  Filled 2023-01-20 (×3): qty 1

## 2023-01-20 MED ORDER — ADULT MULTIVITAMIN W/MINERALS CH
1.0000 | ORAL_TABLET | Freq: Every day | ORAL | Status: DC
  Administered 2023-01-20 – 2023-01-24 (×5): 1 via ORAL
  Filled 2023-01-20 (×5): qty 1

## 2023-01-20 MED ORDER — AMLODIPINE BESYLATE 5 MG PO TABS
5.0000 mg | ORAL_TABLET | Freq: Every day | ORAL | Status: DC
  Administered 2023-01-20 – 2023-01-22 (×3): 5 mg via ORAL
  Filled 2023-01-20 (×3): qty 1

## 2023-01-20 MED ORDER — ONDANSETRON HCL 4 MG/2ML IJ SOLN
4.0000 mg | Freq: Four times a day (QID) | INTRAMUSCULAR | Status: DC | PRN
  Administered 2023-01-21 – 2023-01-22 (×2): 4 mg via INTRAVENOUS
  Filled 2023-01-20 (×2): qty 2

## 2023-01-20 NOTE — Assessment & Plan Note (Signed)
-   PPI resumed °

## 2023-01-20 NOTE — Assessment & Plan Note (Signed)
Resumed home metformin 1500 mg nightly Insulin SSI with agents coverage ordered

## 2023-01-20 NOTE — Assessment & Plan Note (Addendum)
Resumed home Coreg 25 mg p.o. twice daily, Norvasc 5 mg daily Hydralazine 5 mg IV every 8 hours as needed for SBP greater than 175, 4 days ordered

## 2023-01-20 NOTE — ED Triage Notes (Signed)
Patient sent to ED by PCP for abnormal labs- sodium 122. Patient states she has had much energy like normal. NAD noted at this time. AOx4

## 2023-01-20 NOTE — Assessment & Plan Note (Signed)
Atorvastatin 10 mg daily resume

## 2023-01-20 NOTE — H&P (Signed)
History and Physical   AMATULLAH KASPARIAN NGE:[id number] DOB: [birth year] DOA: 01/20/2023  PCP: Lauro Regulus, MD  Patient coming from: Home  I have personally briefly reviewed patient's old medical records in [facility] EMR.  Chief Concern: Abnormal outpatient labs and weakness  HPI: [name] is a 89 year old female with history of hypertension, non-insulin-dependent diabetes mellitus, GERD, neuropathy, who presents emergency department for chief concerns of abnormal labs from PCP clinic.  Vitals in the ED showed temperature of 98.5, respiration rate of 18, heart rate of 63, blood pressure 151/69, SpO2 of 95% on room air.   Serum sodium is 119, potassium 3.6, chloride 83, bicarb 25, BUN of 6, serum creatinine 0.55, EGFR greater than 60, nonfasting blood glucose 143, WBC 6.6, hemoglobin 9.2, platelets of 277.  Portable chest x-ray showed cardiomegaly without overt failure.  Globular cardiac configuration, suspect combination of chamber enlargement and pericardial effusion.  Of note, we reviewed CTA PE done on 01/14/2023: Read as moderate pericardial effusion.  No evidence of PE.  Mild cardiomegaly.  Hiatal hernia.  Aortic atherosclerosis.  ED treatment: None ------------------------- At bedside, patient was able to tell me her name, age, location, current calendar year.  She reports that over the last week she has been having increasing weakness and lack of appetite.  She states she feels so weak she does not take her medications.  She endorses decreased p.o. intake including fluid and food.  She endorses some shortness of breath with exertion.  She denies fever, chills, cough, abdominal pain, dysuria, hematuria, diarrhea.  She endorses left lower extremity swelling.  Social history: Lives at home with her husband and son.  She denies tobacco, EtOH, recreational drug use.  ROS: Constitutional: no weight change, no fever ENT/Mouth: no sore throat, no rhinorrhea Eyes: no  eye pain, no vision changes Cardiovascular: no chest pain, no dyspnea,  no edema, no palpitations Respiratory: no cough, no sputum, no wheezing Gastrointestinal: no nausea, no vomiting, no diarrhea, no constipation Genitourinary: no urinary incontinence, no dysuria, no hematuria Musculoskeletal: no arthralgias, no myalgias Skin: no skin lesions, no pruritus, Neuro: + weakness, no loss of consciousness, no syncope Psych: no anxiety, no depression, + decrease appetite Heme/Lymph: no bruising, no bleeding  ED Course: Discussed with emergency medicine provider, patient requiring hospitalization for chief concerns of hyponatremia.  Assessment/Plan  Principal Problem:   Hyponatremia Active Problems:   Essential hypertension   Diabetes mellitus type 2, noninsulin dependent (HCC)   Hyperlipidemia   GERD (gastroesophageal reflux disease)   Pericardial effusion   Weakness   Assessment and Plan:  * Hyponatremia Check serum osmolality, urine sodium, urine osmolality We will fluid restrict at this time given complete echo has been ordered and I do not have a prior echo to evaluate ejection fraction  Weakness Workup in progress at this time in setting of pericardial effusion, complete echo will be ordered to evaluate for distal arrhythmia, valvular abnormality Fall precaution  Pericardial effusion Complete echo ordered  GERD (gastroesophageal reflux disease) PPI resumed  Hyperlipidemia Atorvastatin 10 mg daily resume  Diabetes mellitus type 2, noninsulin dependent (HCC) Resumed home metformin 1500 mg nightly Insulin SSI with agents coverage ordered  Essential hypertension Resumed home Coreg 25 mg p.o. twice daily, Norvasc 5 mg daily Hydralazine 5 mg IV every 8 hours as needed for SBP greater than 175, 4 days ordered  Chart reviewed.   DVT prophylaxis: Enoxaparin Code Status: Full code Diet: Carb modified diet Family Communication: Updated son at bedside with  patient's  permission Disposition Plan: Pending clinical course, pending complete echo and serum sodium improvement Consults called: None at this time Admission status: Telemetry medical, inpatient  Past Medical History:  Diagnosis Date   Actinic keratosis    Basal cell carcinoma 04/07/2010   Right paranasal.    Diabetes mellitus without complication (HCC)    History reviewed. No pertinent surgical history.  Social History:  reports that she has never smoked. She has never used smokeless tobacco. She reports that she does not drink alcohol and does not use drugs.  Allergies  Allergen Reactions   Lisinopril Other (See Comments)    cough   Family History  Problem Relation Age of Onset   Breast cancer Other    Family history: Family history reviewed and not pertinent.  Prior to Admission medications   Medication Sig Start Date End Date Taking? Authorizing Provider  amLODipine (NORVASC) 5 MG tablet Take by mouth. 02/19/20   [provider]  aspirin 81 MG EC tablet Take by mouth.    [provider]  atorvastatin (LIPITOR) 10 MG tablet Take 1 tablet by mouth daily. 12/22/19   [provider]  carvedilol (COREG) 25 MG tablet Take by mouth. 02/19/20   [provider]  Cholecalciferol 50 MCG (2000 UT) CAPS Take by mouth.    [provider]  colchicine 0.6 MG tablet Take 2 tablets (1.2mg ) by mouth at first sign of gout flare followed by 1 tablet (0.6mg ) after 1 hour. (Max 1.8mg  within 1 hour) 02/19/20   [provider]  diclofenac Sodium (VOLTAREN) 1 % GEL Apply 2 g topically 3 (three) times daily as needed. 12/13/20   Lucy Chris, PA  furosemide (LASIX) 20 MG tablet TAKE ONE TABLET BY MOUTH DAILY AS NEEDED FOR EDEMA 12/04/19   [provider]  gabapentin (NEURONTIN) 300 MG capsule Take 1 capsule by mouth 3 (three) times daily. 02/06/20   [provider]  Lidocaine (HM LIDOCAINE PATCH) 4 % PTCH Apply 1 patch topically daily.  12/13/20   Lucy Chris, PA  losartan-hydrochlorothiazide (HYZAAR) 100-25 MG tablet Take 1 tablet by mouth daily. 12/22/19   [provider]  metFORMIN (GLUCOPHAGE-XR) 500 MG 24 hr tablet TAKE THREE TABLETS BY MOUTH DAILY WITH DINNER 03/22/20   [provider]  Multiple Vitamin (MULTI-VITAMIN) tablet Take 1 tablet by mouth daily.    [provider]  pantoprazole (PROTONIX) 40 MG tablet Take 1 tablet by mouth daily. 04/02/20   [provider]  potassium chloride (KLOR-CON) 10 MEQ tablet Take 1 tablet by mouth daily. 12/22/19   [provider]  triamcinolone cream (KENALOG) 0.1 % Apply topically. 05/17/18   [provider]  triamcinolone cream (KENALOG) 0.1 % Apply to itchy rash on body 1-2 times daily until improved. Avoid face, groin, underarms. 06/02/22   Willeen Niece, MD   Physical Exam: Vitals:   01/20/23 1234 01/20/23 1704  BP: (!) 151/69 (!) 170/66  Pulse: 63   Resp: 18 18  Temp: 98.5 F (36.9 C)   TempSrc: Oral   SpO2: 95%   Weight:  55 kg  Height:  5' 0.24" (1.53 m)   Constitutional: appears age-appropriate, frail, calm Eyes: PERRL, lids and conjunctivae normal ENMT: Mucous membranes are moist. Posterior pharynx clear of any exudate or lesions. Age-appropriate dentition. Hearing appropriate Neck: normal, supple, no masses, no thyromegaly Respiratory: clear to auscultation bilaterally, no wheezing, no crackles. Normal respiratory effort. No accessory muscle use.  Cardiovascular: Regular rate and rhythm,  no murmurs / rubs / gallops. No extremity edema. 2+ pedal pulses. No carotid bruits.  Abdomen: no tenderness, no masses palpated, no hepatosplenomegaly. Bowel sounds positive.  Musculoskeletal: no clubbing / cyanosis. No joint deformity upper and lower extremities. Good ROM, no contractures, no atrophy. Normal muscle tone.  Skin: no rashes, lesions, ulcers. No induration Neurologic: Sensation intact. Strength 5/5 in all 4.   Psychiatric: Normal judgment and insight. Alert and oriented x 3. Normal mood.   EKG: independently reviewed, showing sinus rhythm with rate of 61, QTc 423  Chest x-ray on Admission: I personally reviewed and I agree with radiologist reading as below.  DG Chest Portable 1 View  Result Date: 01/20/2023 CLINICAL DATA:  Assess for edema EXAM: PORTABLE CHEST 1 VIEW COMPARISON:  CT 01/14/2023 FINDINGS: Cardiomegaly with slightly globular cardiac configuration. No overt pulmonary edema or pleural effusion. No focal airspace disease. Advanced arthropathy left shoulder with irregular appearing glenohumeral joint space. IMPRESSION: 1. Cardiomegaly without overt failure. Globular cardiac configuration, suspect combination of chamber enlargement and pericardial effusion. Electronically Signed   By: Jasmine Pang M.D.   On: 01/20/2023 16:48    Labs on Admission: I have personally reviewed following labs  CBC: Recent Labs  Lab 01/20/23 1237  WBC 6.6  NEUTROABS 4.6  HGB 9.2*  HCT 27.2*  MCV 75.8*  PLT 277   Basic Metabolic Panel: Recent Labs  Lab 01/14/23 1729 01/20/23 1237  NA  --  119*  K  --  3.6  CL  --  83*  CO2  --  25  GLUCOSE  --  143*  BUN  --  6*  CREATININE 0.90 0.55  CALCIUM  --  8.5*   GFR: Estimated Creatinine Clearance: 36 mL/min (by C-G formula based on SCr of 0.55 mg/dL).  Liver Function Tests: Recent Labs  Lab 01/20/23 1237  AST 38  ALT 19  ALKPHOS 57  BILITOT 0.8  PROT 6.8  ALBUMIN 3.5   This document was prepared using Dragon Voice Recognition software and may include unintentional dictation errors.  Dr. Sedalia Muta [facility]  If 7PM-7AM, please contact overnight-coverage provider If 7AM-7PM, please contact day attending provider [email or web address]  01/20/2023, 7:03 PM

## 2023-01-20 NOTE — Hospital Course (Addendum)
[name] is a 89 year old female with history of hypertension, non-insulin-dependent diabetes mellitus, GERD, neuropathy, who presents emergency department for chief concerns of abnormal labs from PCP clinic.  Vitals in the ED showed temperature of 98.5, respiration rate of 18, heart rate of 63, blood pressure 151/69, SpO2 of 95% on room air.   Serum sodium is 119, potassium 3.6, chloride 83, bicarb 25, BUN of 6, serum creatinine 0.55, EGFR greater than 60, nonfasting blood glucose 143, WBC 6.6, hemoglobin 9.2, platelets of 277.  Portable chest x-ray showed cardiomegaly without overt failure.  Globular cardiac configuration, suspect combination of chamber enlargement and pericardial effusion.  Of note, we reviewed CTA PE done on 01/14/2023: Read as moderate pericardial effusion.  No evidence of PE.  Mild cardiomegaly.  Hiatal hernia.  Aortic atherosclerosis.  ED treatment: None

## 2023-01-20 NOTE — Assessment & Plan Note (Signed)
-   Complete echo ordered 

## 2023-01-20 NOTE — Assessment & Plan Note (Addendum)
Workup in progress at this time in setting of pericardial effusion, complete echo will be ordered to evaluate for distal arrhythmia, valvular abnormality Fall precaution

## 2023-01-20 NOTE — ED Provider Notes (Signed)
[facility] Provider Note    Event Date/Time   First MD Initiated Contact with Patient 01/20/23 1627     (approximate)   History   Abnormal Labs   HPI  Tracy Spears is a 89 y.o. female with a history of diabetes hypertension on losartan HCTZ presents to the ER for generalized weakness and malaise over the past few days noted to be hyponatremic acutely to 119.  No seizures.  No numbness or tingling.  States that she does restrict her diet with salt.  Saw her PCP yesterday for this blood work was told to hold her antihypertensive medication.  Does have a recent diagnosis of pericardial effusion noted on CTA.  No significant pulmonary edema consolidation or PE noted.     Physical Exam   Triage Vital Signs: ED Triage Vitals  Enc Vitals Group     BP 01/20/23 1234 (!) 151/69     Pulse Rate 01/20/23 1234 63     Resp 01/20/23 1234 18     Temp 01/20/23 1234 98.5 F (36.9 C)     Temp Source 01/20/23 1234 Oral     SpO2 01/20/23 1234 95 %     Weight --      Height --      Head Circumference --      Peak Flow --      Pain Score 01/20/23 1235 0     Pain Loc --      Pain Edu? --      Excl. in GC? --     Most recent vital signs: Vitals:   01/20/23 1234  BP: (!) 151/69  Pulse: 63  Resp: 18  Temp: 98.5 F (36.9 C)  SpO2: 95%     Constitutional: Alert  Eyes: Conjunctivae are normal.  Head: Atraumatic. Nose: No congestion/rhinnorhea. Mouth/Throat: Mucous membranes are moist.   Neck: Painless ROM.  Cardiovascular:   Good peripheral circulation. Respiratory: Normal respiratory effort.  No retractions.  Gastrointestinal: Soft and nontender.  Musculoskeletal:  no deformity Neurologic:  MAE spontaneously. No gross focal neurologic deficits are appreciated.  Skin:  Skin is warm, dry and intact. No rash noted. Psychiatric: Mood and affect are normal. Speech and behavior are normal.    ED Results / Procedures / Treatments   Labs (all labs  ordered are listed, but only abnormal results are displayed) Labs Reviewed  CBC WITH DIFFERENTIAL/PLATELET - Abnormal; Notable for the following components:      Result Value   RBC 3.59 (*)    Hemoglobin 9.2 (*)    HCT 27.2 (*)    MCV 75.8 (*)    MCH 25.6 (*)    RDW 16.7 (*)    All other components within normal limits  COMPREHENSIVE METABOLIC PANEL - Abnormal; Notable for the following components:   Sodium 119 (*)    Chloride 83 (*)    Glucose, Bld 143 (*)    BUN 6 (*)    Calcium 8.5 (*)    All other components within normal limits     EKG    RADIOLOGY Please see ED Course for my review and interpretation.  I personally reviewed all radiographic images ordered to evaluate for the above acute complaints and reviewed radiology reports and findings.  These findings were personally discussed with the patient.  Please see medical record for radiology report.    PROCEDURES:  Critical Care performed: No  Procedures   MEDICATIONS ORDERED IN ED: Medications - No data to  display   IMPRESSION / MDM / ASSESSMENT AND PLAN / ED COURSE  I reviewed the triage vital signs and the nursing notes.                              Differential diagnosis includes, but is not limited to, Dehydration, hyponatremia, sepsis, pna, uti, hypoglycemia, cva, drug effect, withdrawal, encephalitis  Patient presenting to the ER for evaluation of symptoms as described above.  Based on symptoms, risk factors and considered above differential, this presenting complaint could reflect a potentially life-threatening illness therefore the patient will be placed on continuous pulse oximetry and telemetry for monitoring.  Laboratory evaluation will be sent to evaluate for the above complaints.  Chest x-ray my review and interpretation shows cardiomegaly without overt edema.  Based on presentation I do feel patient require admission for echocardiogram, free water restriction and further management of her acute  hyponatremia.  Have consulted hospitalist for admission.       FINAL CLINICAL IMPRESSION(S) / ED DIAGNOSES   Final diagnoses:  Acute hyponatremia  Weakness     Rx / DC Orders   ED Discharge Orders     None        Note:  This document was prepared using Dragon voice recognition software and may include unintentional dictation errors.    Willy Eddy, MD 01/20/23 [phone]

## 2023-01-20 NOTE — Assessment & Plan Note (Signed)
Check serum osmolality, urine sodium, urine osmolality We will fluid restrict at this time given complete echo has been ordered and I do not have a prior echo to evaluate ejection fraction

## 2023-01-21 ENCOUNTER — Inpatient Hospital Stay: Payer: PPO

## 2023-01-21 DIAGNOSIS — E871 Hypo-osmolality and hyponatremia: Secondary | ICD-10-CM | POA: Diagnosis not present

## 2023-01-21 LAB — IRON AND TIBC
Iron: 36 ug/dL (ref 28–170)
Saturation Ratios: 11 % (ref 10.4–31.8)
TIBC: 342 ug/dL (ref 250–450)
UIBC: 306 ug/dL

## 2023-01-21 LAB — CBC
HCT: 26.5 % — ABNORMAL LOW (ref 36.0–46.0)
Hemoglobin: 8.7 g/dL — ABNORMAL LOW (ref 12.0–15.0)
MCH: 24.8 pg — ABNORMAL LOW (ref 26.0–34.0)
MCHC: 32.8 g/dL (ref 30.0–36.0)
MCV: 75.5 fL — ABNORMAL LOW (ref 80.0–100.0)
Platelets: 267 10*3/uL (ref 150–400)
RBC: 3.51 MIL/uL — ABNORMAL LOW (ref 3.87–5.11)
RDW: 16.7 % — ABNORMAL HIGH (ref 11.5–15.5)
WBC: 6.1 10*3/uL (ref 4.0–10.5)
nRBC: 0 % (ref 0.0–0.2)

## 2023-01-21 LAB — BASIC METABOLIC PANEL
Anion gap: 8 (ref 5–15)
Anion gap: 11 (ref 5–15)
Anion gap: 9 (ref 5–15)
BUN: 7 mg/dL — ABNORMAL LOW (ref 8–23)
BUN: 9 mg/dL (ref 8–23)
BUN: 11 mg/dL (ref 8–23)
CO2: 26 mmol/L (ref 22–32)
CO2: 23 mmol/L (ref 22–32)
CO2: 25 mmol/L (ref 22–32)
Calcium: 8.7 mg/dL — ABNORMAL LOW (ref 8.9–10.3)
Calcium: 9.1 mg/dL (ref 8.9–10.3)
Calcium: 8.5 mg/dL — ABNORMAL LOW (ref 8.9–10.3)
Chloride: 86 mmol/L — ABNORMAL LOW (ref 98–111)
Chloride: 85 mmol/L — ABNORMAL LOW (ref 98–111)
Chloride: 87 mmol/L — ABNORMAL LOW (ref 98–111)
Creatinine, Ser: 0.59 mg/dL (ref 0.44–1.00)
Creatinine, Ser: 0.72 mg/dL (ref 0.44–1.00)
Creatinine, Ser: 0.72 mg/dL (ref 0.44–1.00)
GFR, Estimated: >60 mL/min (ref >60)
GFR, Estimated: >60 mL/min (ref >60)
GFR, Estimated: >60 mL/min (ref >60)
Glucose, Bld: 129 mg/dL — ABNORMAL HIGH (ref 70–99)
Glucose, Bld: 145 mg/dL — ABNORMAL HIGH (ref 70–99)
Glucose, Bld: 124 mg/dL — ABNORMAL HIGH (ref 70–99)
Potassium: 3.2 mmol/L — ABNORMAL LOW (ref 3.5–5.1)
Potassium: 4.5 mmol/L (ref 3.5–5.1)
Potassium: 4.5 mmol/L (ref 3.5–5.1)
Sodium: 120 mmol/L — ABNORMAL LOW (ref 135–145)
Sodium: 119 mmol/L — CL (ref 135–145)
Sodium: 121 mmol/L — ABNORMAL LOW (ref 135–145)

## 2023-01-21 LAB — HEMOGLOBIN A1C
Hgb A1c MFr Bld: 7.1 % — ABNORMAL HIGH (ref 4.8–5.6)
Mean Plasma Glucose: 157.07 mg/dL

## 2023-01-21 LAB — GLUCOSE, CAPILLARY
Glucose-Capillary: 120 mg/dL — ABNORMAL HIGH (ref 70–99)
Glucose-Capillary: 122 mg/dL — ABNORMAL HIGH (ref 70–99)
Glucose-Capillary: 124 mg/dL — ABNORMAL HIGH (ref 70–99)
Glucose-Capillary: 146 mg/dL — ABNORMAL HIGH (ref 70–99)
Glucose-Capillary: 159 mg/dL — ABNORMAL HIGH (ref 70–99)
Glucose-Capillary: 233 mg/dL — ABNORMAL HIGH (ref 70–99)

## 2023-01-21 LAB — PHOSPHORUS: Phosphorus: 2 mg/dL — ABNORMAL LOW (ref 2.5–4.6)

## 2023-01-21 LAB — ECHOCARDIOGRAM COMPLETE
Area-P 1/2: 3.42 cm2
S' Lateral: 2.5 cm
Weight: 1940.05 oz

## 2023-01-21 LAB — VITAMIN B12: Vitamin B-12: 512 pg/mL (ref 180–914)

## 2023-01-21 LAB — MAGNESIUM: Magnesium: 0.9 mg/dL — CL (ref 1.7–2.4)

## 2023-01-21 LAB — URIC ACID: Uric Acid, Serum: 3.5 mg/dL (ref 2.5–7.1)

## 2023-01-21 LAB — TSH: TSH: 2.745 u[IU]/mL (ref 0.350–4.500)

## 2023-01-21 LAB — FOLATE: Folate: 28 ng/mL (ref >5.9)

## 2023-01-21 MED ORDER — SODIUM CHLORIDE 1 G PO TABS
2.0000 g | ORAL_TABLET | Freq: Three times a day (TID) | ORAL | Status: AC
  Administered 2023-01-21 (×3): 2 g via ORAL
  Filled 2023-01-21 (×3): qty 2

## 2023-01-21 MED ORDER — POTASSIUM PHOSPHATES 15 MMOLE/5ML IV SOLN
30.0000 mmol | Freq: Once | INTRAVENOUS | Status: AC
  Administered 2023-01-21: 30 mmol via INTRAVENOUS
  Filled 2023-01-21: qty 10

## 2023-01-21 MED ORDER — POTASSIUM CHLORIDE CRYS ER 20 MEQ PO TBCR
40.0000 meq | EXTENDED_RELEASE_TABLET | ORAL | Status: DC
  Administered 2023-01-21: 40 meq via ORAL
  Filled 2023-01-21: qty 2

## 2023-01-21 MED ORDER — POTASSIUM CHLORIDE CRYS ER 20 MEQ PO TBCR: 40.0000 meq | EXTENDED_RELEASE_TABLET | Freq: Once | ORAL | Status: DC

## 2023-01-21 MED ORDER — MAGNESIUM SULFATE 4 GM/100ML IV SOLN
4.0000 g | Freq: Once | INTRAVENOUS | Status: AC
  Administered 2023-01-21: 4 g via INTRAVENOUS
  Filled 2023-01-21: qty 100

## 2023-01-21 MED ORDER — SODIUM CHLORIDE 1 G PO TABS
2.0000 g | ORAL_TABLET | Freq: Three times a day (TID) | ORAL | Status: AC
  Administered 2023-01-22 – 2023-01-23 (×6): 2 g via ORAL
  Filled 2023-01-21 (×6): qty 2

## 2023-01-21 NOTE — Plan of Care (Signed)
Lab called with critical Na of 119 - same as last night on admittance.  Notified on call NP who notified Dr. Lucianne Muss.  Dr. Lucianne Muss ordered 2 mg Na tabs w/meals starting tomorrow.

## 2023-01-21 NOTE — Progress Notes (Signed)
Spoke with lab technician regarding ordered BMP for noon today that hadn't been completed. Aware of order and will draw. MD made aware.

## 2023-01-21 NOTE — Evaluation (Signed)
Occupational Therapy Evaluation Patient Details Name: Tracy Spears MRN: [id number] DOB: February 11, 1933 Today's Date: 01/21/2023   History of Present Illness Tracy Spears is a 89 y.o. female with a history of diabetes hypertension on losartan HCTZ presents to the ER for generalized weakness and malaise over the past few days noted to be hyponatremic acutely to 119.   Clinical Impression   Patient received for OT evaluation. See flowsheet below for details of function. Generally, patient requiring (I) for bed mobility, MOD (I) with RW for functional mobility, and MOD (I) for ADLs. Patient with no further need for OT in acute care; discharge OT services.       Recommendations for follow up therapy are one component of a multi-disciplinary discharge planning process, led by the attending physician.  Recommendations may be updated based on patient status, additional functional criteria and insurance authorization.   Assistance Recommended at Discharge PRN  Patient can return home with the following  (Intermittent assist as needed)    Functional Status Assessment  Patient has not had a recent decline in their functional status  Equipment Recommendations  None recommended by OT    Recommendations for Other Services       Precautions / Restrictions Precautions Precautions: Fall Restrictions Weight Bearing Restrictions: No      Mobility Bed Mobility Overal bed mobility: Modified Independent             General bed mobility comments: sit to supine    Transfers Overall transfer level: Modified independent Equipment used: Rolling walker (2 wheels) Transfers: Sit to/from Stand (from regular commode to standing) Sit to Stand: Modified independent (Device/Increase time) (with use of grab bar and extra time)                  Balance Overall balance assessment: Mild deficits observed, not formally tested                                         ADL  either performed or assessed with clinical judgement   ADL Overall ADL's : At baseline;Modified independent                                       General ADL Comments: Pt demonstrated MOD (I) toileting, hygiene, clothing management, hand hygiene, functional mobility with RW back to EOB. Pt demonstrated ability to make figure four position indicating LB dressing ability. Pt states she feels like she has more energy now than she has in the recent past; states she feels almost back to baseline level of energy and function.     Vision         Perception     Praxis      Pertinent Vitals/Pain Pain Assessment Pain Assessment: No/denies pain     Hand Dominance Right   Extremity/Trunk Assessment Upper Extremity Assessment Upper Extremity Assessment: Overall WFL for tasks assessed (LUE with slight decrease in shoulder ROM; pt states this is baseline)   Lower Extremity Assessment Lower Extremity Assessment: Defer to PT evaluation   Cervical / Trunk Assessment Cervical / Trunk Assessment: Kyphotic   Communication Communication Communication: No difficulties   Cognition Arousal/Alertness: Awake/alert Behavior During Therapy: WFL for tasks assessed/performed Overall Cognitive Status: Within Functional Limits for tasks assessed  General Comments: very pleasant and cooperative     General Comments  Pt received on toilet. Walked from toilet back into room with RW.    Exercises     Shoulder Instructions      Home Living Family/patient expects to be discharged to:: Private residence Living Arrangements: Spouse/significant other;Children (adult son; he works during the day) Available Help at Discharge: Family;Available 24 hours/day Type of Home: House Home Access: Level entry     Home Layout: One level     Bathroom Shower/Tub: Producer, television/film/video: Handicapped height Bathroom Accessibility: No    Home Equipment: Rollator (4 wheels);Rolling Walker (2 wheels);Hand held shower head;Grab bars - tub/shower;Shower seat          Prior Functioning/Environment Prior Level of Function : Independent/Modified Independent             Mobility Comments: uses 3 wheeled walker at baseline; sometimes uses rollator if she wants to have the option to sit down. ADLs Comments: Son recently assisting with transportation due to acute illness, but otherwise pt (I) with ADLs and many IADLs. Son helps intermittently with household tasks; pt cooks.        OT Problem List:        OT Treatment/Interventions:      OT Goals(Current goals can be found in the care plan section) Acute Rehab OT Goals Patient Stated Goal: Go home. OT Goal Formulation: All assessment and education complete, DC therapy  OT Frequency:      Co-evaluation              AM-PAC OT "6 Clicks" Daily Activity     Outcome Measure                 End of Session    Activity Tolerance:   Patient left:                     Time: 1359-1416 OT Time Calculation (min): 17 min Charges:  OT General Charges $OT Visit: 1 Visit OT Evaluation $OT Eval Moderate Complexity: 1 Mod  Tracy Spears Tracy Panning, MS, OTR/L  Tracy Spears 01/21/2023, 2:33 PM

## 2023-01-21 NOTE — Progress Notes (Signed)
Pt Ultrasound in stable condition via bed with transport

## 2023-01-21 NOTE — Evaluation (Signed)
Physical Therapy Evaluation Patient Details Name: Tracy Spears MRN: [id number] DOB: 1933/01/26 Today's Date: 01/21/2023  History of Present Illness  Tracy Spears is a 89 y.o. female with a history of diabetes hypertension on losartan HCTZ presents to the ER for generalized weakness and malaise over the past few days noted to be hyponatremic acutely to 119.   Clinical Impression  Pt admitted with above diagnosis.  Pt currently with functional limitations due to the deficits listed below (see PT Problem List). Pt received upright in bed with family present agreeable to PT eval. Pt reports at baseline she is mod-I with her RW (unclear of which type as pt has varying RW's) and ADL's/IADL's.   To date, pt able to exit L side of bed mod-I with bed features and stand to RW with bouts of momentum and ambulate the NSG unit at supervision level returning to EOB in care of RN. Pt reports mild weakness from her baseline but is not far off. Pt and author in agreement no f/u PT recommendations needed with no acute PT needs identified. PT to sign off.        Recommendations for follow up therapy are one component of a multi-disciplinary discharge planning process, led by the attending physician.  Recommendations may be updated based on patient status, additional functional criteria and insurance authorization.     Assistance Recommended at Discharge PRN  Patient can return home with the following  Assistance with cooking/housework;Assist for transportation    Equipment Recommendations None recommended by PT  Recommendations for Other Services       Functional Status Assessment Patient has had a recent decline in their functional status and demonstrates the ability to make significant improvements in function in a reasonable and predictable amount of time.     Precautions / Restrictions Precautions Precautions: Fall Restrictions Weight Bearing Restrictions: No      Mobility  Bed  Mobility Overal bed mobility: Modified Independent             General bed mobility comments: using bed features Patient Response: Cooperative  Transfers Overall transfer level: Needs assistance Equipment used: Rolling walker (2 wheels) Transfers: Sit to/from Stand Sit to Stand: Supervision           General transfer comment: bouts of momentum,VC's for anterior weight shift    Ambulation/Gait Ambulation/Gait assistance: Supervision Gait Distance (Feet): 180 Feet Assistive device: Rolling walker (2 wheels) Gait Pattern/deviations: Step-through pattern, Trunk flexed       General Gait Details: consistent step through pattern  Stairs            Wheelchair Mobility    Modified Rankin (Stroke Patients Only)       Balance Overall balance assessment: Needs assistance Sitting-balance support: Bilateral upper extremity supported, Feet supported Sitting balance-Leahy Scale: Fair     Standing balance support: Bilateral upper extremity supported, During functional activity Standing balance-Leahy Scale: Fair Standing balance comment: using RW                             Pertinent Vitals/Pain Pain Assessment Pain Assessment: Faces Faces Pain Scale: No hurt    Home Living Family/patient expects to be discharged to:: Private residence Living Arrangements: Spouse/significant other;Children Available Help at Discharge: Family;Available 24 hours/day Type of Home: House Home Access: Level entry       Home Layout: One level Home Equipment: Rollator (4 wheels);Rolling Walker (2 wheels);Hand held shower head;Grab bars -  tub/shower;Shower seat      Prior Function Prior Level of Function : Independent/Modified Independent             Mobility Comments: uses RW at baseline ADLs Comments: Son recently assisting with transportation due to acute illness     Hand Dominance        Extremity/Trunk Assessment   Upper Extremity Assessment Upper  Extremity Assessment: Defer to OT evaluation    Lower Extremity Assessment Lower Extremity Assessment: Generalized weakness    Cervical / Trunk Assessment Cervical / Trunk Assessment: Kyphotic  Communication   Communication: No difficulties  Cognition Arousal/Alertness: Awake/alert Behavior During Therapy: WFL for tasks assessed/performed Overall Cognitive Status: Within Functional Limits for tasks assessed                                 General Comments: very pleasant and cooperative        General Comments      Exercises     Assessment/Plan    PT Assessment Patient does not need any further PT services  PT Problem List         PT Treatment Interventions      PT Goals (Current goals can be found in the Care Plan section)  Acute Rehab PT Goals PT Goal Formulation: All assessment and education complete, DC therapy    Frequency       Co-evaluation               AM-PAC PT "6 Clicks" Mobility  Outcome Measure Help needed turning from your back to your side while in a flat bed without using bedrails?: A Little Help needed moving from lying on your back to sitting on the side of a flat bed without using bedrails?: A Little Help needed moving to and from a bed to a chair (including a wheelchair)?: A Little Help needed standing up from a chair using your arms (e.g., wheelchair or bedside chair)?: A Little Help needed to walk in hospital room?: A Little Help needed climbing 3-5 steps with a railing? : A Little 6 Click Score: 18    End of Session Equipment Utilized During Treatment: Gait belt Activity Tolerance: Patient tolerated treatment well Patient left: in bed;with nursing/sitter in room;with family/visitor present (seated EOB) Nurse Communication: Mobility status PT Visit Diagnosis: Muscle weakness (generalized) (M62.81)    Time: 1610-9604 PT Time Calculation (min) (ACUTE ONLY): 14 min   Charges:   PT Evaluation $PT Eval Low  Complexity: 1 Low         Thurma Priego M. Fairly IV, PT, DPT Physical Therapist- [place]  [facility]  01/21/2023, 12:55 PM

## 2023-01-21 NOTE — Progress Notes (Signed)
[facility] Progress Note  Patient: Tracy Spears    YQM:[id number]  DOA: 01/20/2023     Date of Service: the patient was seen and examined on 01/21/2023  Chief Complaint  Patient presents with   Abnormal Labs   Brief hospital course: Tracy Spears is a 89 year old female with history of hypertension, non-insulin-dependent diabetes mellitus, GERD, neuropathy, who presents emergency department for chief concerns of abnormal labs from PCP clinic.   Vitals in the ED showed temperature of 98.5, respiration rate of 18, heart rate of 63, blood pressure 151/69, SpO2 of 95% on room air.    Serum sodium is 119, potassium 3.6, chloride 83, bicarb 25, BUN of 6, serum creatinine 0.55, EGFR greater than 60, nonfasting blood glucose 143, WBC 6.6, hemoglobin 9.2, platelets of 277.   Portable chest x-ray showed cardiomegaly without overt failure.  Globular cardiac configuration, suspect combination of chamber enlargement and pericardial effusion.   Of note, we reviewed CTA PE done on 01/14/2023: Read as moderate pericardial effusion.  No evidence of PE.  Mild cardiomegaly.  Hiatal hernia.  Aortic atherosclerosis.   Assessment and Plan:  Hypotonic Hyponatremia  Serum osmole at 252, very low.  Patient did mention that she is drinking a lot of liquids, Gatorade and water. Urine osmolarity 130, urine sodium 46 Started fluid restriction 1.5 L/day Sodium chloride 2 g p.o. 3 times daily x 3 doses Na 119--120 Check sodium level every 8 hourly   # Hypokalemia, potassium repleted. # Hypomagnesemia, mag repleted. # Hypophosphatemia, Phos repleted. Check electrolytes and replete as needed.   HTN, HLD TTE LVEF 60-65%, no pericardial effusion Resumed amlodipine, Coreg, aspirin, statin home dose Monitor BP and titrate medications accordingly.  NIDDM T2, A1C 7.1 well controlled Hold home meds Started NovoLog sliding scale  Anemia of ch. Disease Iron, folate and B12 wnl Monitor  H&H  GERD on PPI  Body mass index is 23.85 kg/m.  Interventions:      Diet: Carb modified diet DVT Prophylaxis: Subcutaneous Lovenox   Advance goals of care discussion: Full code  Family Communication: family was present at bedside, at the time of interview.  The pt provided permission to discuss medical plan with the family. Opportunity was given to ask question and all questions were answered satisfactorily.   Disposition:  Pt is from Home, admitted with Hyponatremia, still has Low Na, which precludes a safe discharge. Discharge to Home, when clinically stable, may need few days to improve..  Subjective: No significant events overnight, patient denies any specific complaints, no chest pain or palpitation no shortness of breath, resting comfortably in the bed.  Fluid striction was advised.  Physical Exam: General: NAD, lying comfortably Appear in no distress, affect appropriate Eyes: PERRLA ENT: Oral Mucosa Clear, moist  Neck: no JVD,  Cardiovascular: S1 and S2 Present, no Murmur,  Respiratory: good respiratory effort, Bilateral Air entry equal and Decreased, no Crackles, no wheezes Abdomen: Bowel Sound present, Soft and no tenderness,  Skin: no rashes Extremities: no Pedal edema, no calf tenderness Neurologic: without any new focal findings Gait not checked due to patient safety concerns  Vitals:   01/20/23 2344 01/21/23 0502 01/21/23 0800 01/21/23 1122  BP: 128/63 (!) 154/57 (!) 152/60 106/66  Pulse: (!) 59 (!) 55 62 65  Resp: 17 18 18 17   Temp: 98.4 F (36.9 C) 98.3 F (36.8 C) 98.4 F (36.9 C) 98 F (36.7 C)  TempSrc:   Oral   SpO2: 98% 97% 98% 99%  Weight:  Height:        Intake/Output Summary (Last 24 hours) at 01/21/2023 1406 Last data filed at 01/21/2023 0930 Gross per 24 hour  Intake 240 ml  Output 400 ml  Net -160 ml   Filed Weights   01/20/23 1704 01/20/23 2058  Weight: 55 kg 55.4 kg    Data Reviewed: I have personally reviewed and  interpreted daily labs, tele strips, imagings as discussed above. I reviewed all nursing notes, pharmacy notes, vitals, pertinent old records I have discussed plan of care as described above with RN and patient/family.  CBC: Recent Labs  Lab 01/20/23 1237 01/21/23 0544  WBC 6.6 6.1  NEUTROABS 4.6  --   HGB 9.2* 8.7*  HCT 27.2* 26.5*  MCV 75.8* 75.5*  PLT 277 267   Basic Metabolic Panel: Recent Labs  Lab 01/14/23 1729 01/20/23 1237 01/21/23 0544  NA  --  119* 120*  K  --  3.6 3.2*  CL  --  83* 86*  CO2  --  25 26  GLUCOSE  --  143* 129*  BUN  --  6* 7*  CREATININE 0.90 0.55 0.59  CALCIUM  --  8.5* 8.7*  MG  --   --  0.9*  PHOS  --   --  2.0*    Studies: ECHOCARDIOGRAM COMPLETE  Result Date: 01/21/2023    ECHOCARDIOGRAM REPORT   Patient Name:   Tracy Spears Date of Exam: 01/20/2023 Medical Rec #:  [id number]         Height:       60.2 in Accession #:    [id number]        Weight:       121.3 lb Date of Birth:  09/01/1933          BSA:          1.513 m Patient Age:    89 years          BP:           151/69 mmHg Patient Gender: F                 HR:           52 bpm. Exam Location:  [facility] Procedure: 2D Echo, Cardiac Doppler and Color Doppler Indications:     I31.3 Pericardial effusion  History:         Patient has no prior history of Echocardiogram examinations.                  Risk Factors:Hypertension and Diabetes.  Sonographer:     Daphine Deutscher RDCS Referring Phys:  [id number] AMY N COX Diagnosing Phys: Debbe Odea MD IMPRESSIONS  1. Left ventricular ejection fraction, by estimation, is 60 to 65%. The left ventricle has normal function. The left ventricle has no regional wall motion abnormalities. Left ventricular diastolic parameters are consistent with Grade II diastolic dysfunction (pseudonormalization).  2. Right ventricular systolic function is normal. The right ventricular size is normal.  3. Left atrial size was severely dilated.  4. The mitral valve is  grossly normal. Moderate mitral valve regurgitation.  5. The aortic valve is tricuspid. Aortic valve regurgitation is not visualized. Aortic valve sclerosis/calcification is present, without any evidence of aortic stenosis.  6. Aortic dilatation noted. There is borderline dilatation of the ascending aorta, measuring 38 mm.  7. The inferior vena cava is normal in size with greater than 50% respiratory variability, suggesting right atrial pressure of 3 mmHg. FINDINGS  Left Ventricle: Left ventricular ejection fraction, by estimation, is 60 to 65%. The left ventricle has normal function. The left ventricle has no regional wall motion abnormalities. The left ventricular internal cavity size was normal in size. There is  no left ventricular hypertrophy. Left ventricular diastolic parameters are consistent with Grade II diastolic dysfunction (pseudonormalization). Right Ventricle: The right ventricular size is normal. No increase in right ventricular wall thickness. Right ventricular systolic function is normal. Left Atrium: Left atrial size was severely dilated. Right Atrium: Right atrial size was normal in size. Pericardium: There is no evidence of pericardial effusion. Mitral Valve: The mitral valve is grossly normal. Moderate mitral valve regurgitation. Tricuspid Valve: The tricuspid valve is normal in structure. Tricuspid valve regurgitation is mild. Aortic Valve: The aortic valve is tricuspid. Aortic valve regurgitation is not visualized. Aortic valve sclerosis/calcification is present, without any evidence of aortic stenosis. Pulmonic Valve: The pulmonic valve was normal in structure. Pulmonic valve regurgitation is trivial. Aorta: Aortic dilatation noted. There is borderline dilatation of the ascending aorta, measuring 38 mm. Venous: The inferior vena cava is normal in size with greater than 50% respiratory variability, suggesting right atrial pressure of 3 mmHg. IAS/Shunts: No atrial level shunt detected by color  flow Doppler.  LEFT VENTRICLE PLAX 2D LVIDd:         4.20 cm   Diastology LVIDs:         2.50 cm   LV e' medial:    5.33 cm/s LV PW:         0.80 cm   LV E/e' medial:  20.5 LV IVS:        1.00 cm   LV e' lateral:   6.85 cm/s LVOT diam:     1.70 cm   LV E/e' lateral: 15.9 LV SV:         51 LV SV Index:   34 LVOT Area:     2.27 cm  RIGHT VENTRICLE             IVC RV Basal diam:  3.00 cm     IVC diam: 1.10 cm RV S prime:     13.10 cm/s TAPSE (M-mode): 2.3 cm LEFT ATRIUM             Index        RIGHT ATRIUM          Index LA diam:        5.20 cm 3.44 cm/m   RA Area:     8.48 cm LA Vol (A2C):   47.6 ml 31.45 ml/m  RA Volume:   17.20 ml 11.37 ml/m LA Vol (A4C):   79.9 ml 52.80 ml/m LA Biplane Vol: 62.3 ml 41.17 ml/m  AORTIC VALVE LVOT Vmax:   100.75 cm/s LVOT Vmean:  64.500 cm/s LVOT VTI:    0.225 m  AORTA Ao Root diam: 3.40 cm Ao Asc diam:  3.80 cm MITRAL VALVE                TRICUSPID VALVE MV Area (PHT): 3.42 cm     TR Peak grad:   28.1 mmHg MV Decel Time: 222 msec     TR Vmax:        265.00 cm/s MV E velocity: 109.00 cm/s MV A velocity: 96.60 cm/s   SHUNTS MV E/A ratio:  1.13         Systemic VTI:  0.22 m  Systemic Diam: 1.70 cm Debbe Odea MD Electronically signed by Debbe Odea MD Signature Date/Time: 01/21/2023/7:57:49 AM    Final    DG Chest Portable 1 View  Result Date: 01/20/2023 CLINICAL DATA:  Assess for edema EXAM: PORTABLE CHEST 1 VIEW COMPARISON:  CT 01/14/2023 FINDINGS: Cardiomegaly with slightly globular cardiac configuration. No overt pulmonary edema or pleural effusion. No focal airspace disease. Advanced arthropathy left shoulder with irregular appearing glenohumeral joint space. IMPRESSION: 1. Cardiomegaly without overt failure. Globular cardiac configuration, suspect combination of chamber enlargement and pericardial effusion. Electronically Signed   By: Jasmine Pang M.D.   On: 01/20/2023 16:48    Scheduled Meds:  amLODipine  5 mg Oral Daily    aspirin EC  81 mg Oral Daily   atorvastatin  10 mg Oral QHS   carvedilol  25 mg Oral BID   cholecalciferol  2,000 Units Oral Daily   enoxaparin (LOVENOX) injection  40 mg Subcutaneous Q24H   insulin aspart  0-5 Units Subcutaneous QHS   insulin aspart  0-9 Units Subcutaneous TID WC   multivitamin with minerals  1 tablet Oral Daily   pantoprazole  40 mg Oral Daily   sodium chloride  2 g Oral TID WC   Continuous Infusions:  potassium PHOSPHATE IVPB (in mmol) 30 mmol (01/21/23 1110)   PRN Meds: acetaminophen **OR** acetaminophen, hydrALAZINE, melatonin, ondansetron **OR** ondansetron (ZOFRAN) IV, senna-docusate  Time spent: 50 minutes  Author: Gillis Santa. MD [facility] 01/21/2023 2:06 PM  To reach On-call, see care teams to locate the attending and reach out to them via [email or web address]. If 7PM-7AM, please contact night-coverage If you still have difficulty reaching the attending provider, please page the [facility] (Director on Call) for [facility] on amion for assistance.

## 2023-01-22 DIAGNOSIS — E871 Hypo-osmolality and hyponatremia: Secondary | ICD-10-CM | POA: Diagnosis not present

## 2023-01-22 LAB — BASIC METABOLIC PANEL
Anion gap: 8 (ref 5–15)
Anion gap: 8 (ref 5–15)
Anion gap: 10 (ref 5–15)
BUN: 9 mg/dL (ref 8–23)
BUN: 12 mg/dL (ref 8–23)
BUN: 15 mg/dL (ref 8–23)
CO2: 26 mmol/L (ref 22–32)
CO2: 26 mmol/L (ref 22–32)
CO2: 23 mmol/L (ref 22–32)
Calcium: 8.7 mg/dL — ABNORMAL LOW (ref 8.9–10.3)
Calcium: 8.9 mg/dL (ref 8.9–10.3)
Calcium: 9.1 mg/dL (ref 8.9–10.3)
Chloride: 92 mmol/L — ABNORMAL LOW (ref 98–111)
Chloride: 91 mmol/L — ABNORMAL LOW (ref 98–111)
Chloride: 95 mmol/L — ABNORMAL LOW (ref 98–111)
Creatinine, Ser: 0.76 mg/dL (ref 0.44–1.00)
Creatinine, Ser: 0.78 mg/dL (ref 0.44–1.00)
Creatinine, Ser: 0.78 mg/dL (ref 0.44–1.00)
GFR, Estimated: >60 mL/min (ref >60)
GFR, Estimated: >60 mL/min (ref >60)
GFR, Estimated: >60 mL/min (ref >60)
Glucose, Bld: 113 mg/dL — ABNORMAL HIGH (ref 70–99)
Glucose, Bld: 152 mg/dL — ABNORMAL HIGH (ref 70–99)
Glucose, Bld: 179 mg/dL — ABNORMAL HIGH (ref 70–99)
Potassium: 3.9 mmol/L (ref 3.5–5.1)
Potassium: 3.9 mmol/L (ref 3.5–5.1)
Potassium: 4.3 mmol/L (ref 3.5–5.1)
Sodium: 126 mmol/L — ABNORMAL LOW (ref 135–145)
Sodium: 125 mmol/L — ABNORMAL LOW (ref 135–145)
Sodium: 128 mmol/L — ABNORMAL LOW (ref 135–145)

## 2023-01-22 LAB — CBC
HCT: 25.3 % — ABNORMAL LOW (ref 36.0–46.0)
Hemoglobin: 8.4 g/dL — ABNORMAL LOW (ref 12.0–15.0)
MCH: 25.5 pg — ABNORMAL LOW (ref 26.0–34.0)
MCHC: 33.2 g/dL (ref 30.0–36.0)
MCV: 76.7 fL — ABNORMAL LOW (ref 80.0–100.0)
Platelets: 250 10*3/uL (ref 150–400)
RBC: 3.3 MIL/uL — ABNORMAL LOW (ref 3.87–5.11)
RDW: 17 % — ABNORMAL HIGH (ref 11.5–15.5)
WBC: 5.5 10*3/uL (ref 4.0–10.5)
nRBC: 0 % (ref 0.0–0.2)

## 2023-01-22 LAB — PHOSPHORUS: Phosphorus: 3.3 mg/dL (ref 2.5–4.6)

## 2023-01-22 LAB — GLUCOSE, CAPILLARY
Glucose-Capillary: 112 mg/dL — ABNORMAL HIGH (ref 70–99)
Glucose-Capillary: 161 mg/dL — ABNORMAL HIGH (ref 70–99)
Glucose-Capillary: 195 mg/dL — ABNORMAL HIGH (ref 70–99)
Glucose-Capillary: 176 mg/dL — ABNORMAL HIGH (ref 70–99)

## 2023-01-22 LAB — MAGNESIUM: Magnesium: 1.7 mg/dL (ref 1.7–2.4)

## 2023-01-22 MED ORDER — GLUCERNA SHAKE PO LIQD
237.0000 mL | Freq: Three times a day (TID) | ORAL | Status: DC
  Administered 2023-01-22 – 2023-01-24 (×4): 237 mL via ORAL

## 2023-01-22 MED ORDER — AMLODIPINE BESYLATE 10 MG PO TABS
10.0000 mg | ORAL_TABLET | Freq: Every day | ORAL | Status: DC
  Administered 2023-01-23 – 2023-01-24 (×2): 10 mg via ORAL
  Filled 2023-01-22 (×2): qty 1

## 2023-01-22 NOTE — Progress Notes (Signed)
Initial Nutrition Assessment  DOCUMENTATION CODES:   Not applicable  INTERVENTION:  - Add Glucerna Shake po TID, each supplement provides 220 kcal and 10 grams of protein  - Liberalize to Regular diet, 1500 mL fluid restriction.   NUTRITION DIAGNOSIS:   Inadequate oral intake related to poor appetite as evidenced by per patient/family report.  GOAL:   Patient will meet greater than or equal to 90% of their needs  MONITOR:   PO intake  REASON FOR ASSESSMENT:   Malnutrition Screening Tool    ASSESSMENT:   89 y.o. female admits related to abnormal outpatient labs and weakness. PMH includes: actinic keratosis, basal cell carcinoma, DM. Pt is currently receiving medical management related to hyponatremia.  Meds reviewed: lipitor, Vit D3, sliding scale insulin, MVI, sodium chloride tablet. Labs reviewed: Na low.   The pt reports that she has a fair appetite but started to notice a decline in her appetite when she became sick. She reports that she would be open to trying Glucerna shakes. RD will add supplements. RD will also liberalize pt to a regular diet while keeping the fluid restriction. RD will continue to monitor PO intakes.   NUTRITION - FOCUSED PHYSICAL EXAM:  WDL - appropriate for age.  Diet Order:   Diet Order             Diet regular Room service appropriate? Yes with Assist; Fluid consistency: Thin; Fluid restriction: 1500 mL Fluid  Diet effective now                   EDUCATION NEEDS:   Not appropriate for education at this time  Skin:  Skin Assessment: Reviewed RN Assessment  Last BM:  5/15  Height:   Ht Readings from Last 1 Encounters:  01/20/23 5' (1.524 m)    Weight:   Wt Readings from Last 1 Encounters:  01/20/23 55.4 kg    Ideal Body Weight:     BMI:  Body mass index is 23.85 kg/m.  Estimated Nutritional Needs:   Kcal:  1610-9604 kcals  Protein:  70-85 gm  Fluid:  >/= 1.3 L  Bethann Humble, RD, LDN, CNSC.

## 2023-01-22 NOTE — Care Management Important Message (Signed)
Important Message  Patient Details  Name: Tracy Spears MRN: [id number] Date of Birth: 10/10/1932   Medicare Important Message Given:  N/A - LOS <3 / Initial given by admissions     [name] A Quanisha Drewry 01/22/2023, 8:52 AM

## 2023-01-22 NOTE — Progress Notes (Addendum)
Progress Note   Patient: Tracy Spears:[id number] DOB: [birth year] DOA: 01/20/2023     2 DOS: the patient was seen and examined on 01/22/2023    Subjective:  Patient seen and examined at bedside this morning in the presence of the husband Hydrochlorothiazide and aspirin have been discontinued after discussion with pharmacist Sodium levels improving She admits to improvement in her weakness Denies nausea vomiting chest pain diarrhea or abdominal pain    Brief hospital course: [name] is a 89 year old female with history of hypertension, non-insulin-dependent diabetes mellitus, GERD, neuropathy, who presents emergency department for chief concerns of abnormal labs from PCP clinic.   Vitals in the ED showed temperature of 98.5, respiration rate of 18, heart rate of 63, blood pressure 151/69, SpO2 of 95% on room air.    Serum sodium is 119, potassium 3.6, chloride 83, bicarb 25, BUN of 6, serum creatinine 0.55, EGFR greater than 60, nonfasting blood glucose 143, WBC 6.6, hemoglobin 9.2, platelets of 277.   Portable chest x-ray showed cardiomegaly without overt failure.  Globular cardiac configuration, suspect combination of chamber enlargement and pericardial effusion.   Of note, we reviewed CTA PE done on 01/14/2023: Read as moderate pericardial effusion.  No evidence of PE.  Mild cardiomegaly.  Hiatal hernia.  Aortic atherosclerosis.     Assessment and Plan:   Hypotonic Hyponatremia-improving Serum osmole at 252, very low.  Patient did mention that she is drinking a lot of liquids, Gatorade and water. Urine osmolarity 130, urine sodium 46 consistent with hyponatremia due to [facility] for liquid intake Started fluid restriction 1.5 L/day Continue sodium chloride 2 g p.o. 3 times daily x 3 doses Monitor sodium level closely Reviewed: Hydrochlorothiazide at this time on account of hyponatremia   # Hypokalemia, potassium repleted. # Hypomagnesemia, mag repleted. #  Hypophosphatemia, Phos repleted. Check electrolytes and replete as needed.     HTN, HLD TTE LVEF 60-65%, no pericardial effusion Resumed amlodipine, Coreg, statin home dose Monitor BP and titrate medications accordingly.   NIDDM T2, A1C 7.1 well controlled Hold home meds Started NovoLog sliding scale   Anemia of ch. Disease Iron, folate and B12 wnl Monitor H&H   GERD on PPI      Diet: Carb modified diet DVT Prophylaxis: Subcutaneous Lovenox    Advance goals of care discussion: Full code   Family Communication: Discussed with husband present at bedside  Disposition:  Discharge to Home, when clinically stable, may need few days to improve.Marland Kitchen    Physical Exam: General: NAD, lying comfortably Appear in no distress, affect appropriate Eyes: PERRLA ENT: Oral Mucosa Clear, moist  Neck: no JVD,  Cardiovascular: S1 and S2 Present, no Murmur,  Respiratory: good respiratory effort,  Abdomen: Bowel Sound present, Soft and no tenderness,  Skin: no rashes Extremities: no Pedal edema, no calf tenderness Neurologic: without any new focal findings Gait not checked due to patient safety concerns  Physical Exam: Vitals:   01/22/23 0755 01/22/23 1145 01/22/23 1148 01/22/23 1626  BP: (!) 142/83 132/66  128/61  Pulse: (!) 54 (!) 58    Resp: 16 20 (!) 23 16  Temp: 98.9 F (37.2 C) 98 F (36.7 C)  98.6 F (37 C)  TempSrc:      SpO2: 95% 93%  96%  Weight:      Height:        Data Reviewed: I have personally reviewed patient's laboratory results showing improvement in sodium from 1 1 9-1 26, potassium 3.9, creatinine 0.78  Time spent: 45 minutes  Author: Loyce Dys, MD 01/22/2023 5:36 PM  For on call review [email or web address].

## 2023-01-23 DIAGNOSIS — E871 Hypo-osmolality and hyponatremia: Secondary | ICD-10-CM | POA: Diagnosis not present

## 2023-01-23 LAB — BASIC METABOLIC PANEL
Anion gap: 5 (ref 5–15)
BUN: 18 mg/dL (ref 8–23)
CO2: 27 mmol/L (ref 22–32)
Calcium: 9.1 mg/dL (ref 8.9–10.3)
Chloride: 97 mmol/L — ABNORMAL LOW (ref 98–111)
Creatinine, Ser: 0.76 mg/dL (ref 0.44–1.00)
GFR, Estimated: >60 mL/min (ref >60)
Glucose, Bld: 135 mg/dL — ABNORMAL HIGH (ref 70–99)
Potassium: 4.2 mmol/L (ref 3.5–5.1)
Sodium: 129 mmol/L — ABNORMAL LOW (ref 135–145)

## 2023-01-23 LAB — CBC WITH DIFFERENTIAL/PLATELET
Abs Immature Granulocytes: 0.01 10*3/uL (ref 0.00–0.07)
Basophils Absolute: 0 10*3/uL (ref 0.0–0.1)
Basophils Relative: 0 %
Eosinophils Absolute: 0.1 10*3/uL (ref 0.0–0.5)
Eosinophils Relative: 1 %
HCT: 25.8 % — ABNORMAL LOW (ref 36.0–46.0)
Hemoglobin: 8.4 g/dL — ABNORMAL LOW (ref 12.0–15.0)
Immature Granulocytes: 0 %
Lymphocytes Relative: 28 %
Lymphs Abs: 1.6 10*3/uL (ref 0.7–4.0)
MCH: 25.6 pg — ABNORMAL LOW (ref 26.0–34.0)
MCHC: 32.6 g/dL (ref 30.0–36.0)
MCV: 78.7 fL — ABNORMAL LOW (ref 80.0–100.0)
Monocytes Absolute: 0.5 10*3/uL (ref 0.1–1.0)
Monocytes Relative: 9 %
Neutro Abs: 3.4 10*3/uL (ref 1.7–7.7)
Neutrophils Relative %: 62 %
Platelets: 279 10*3/uL (ref 150–400)
RBC: 3.28 MIL/uL — ABNORMAL LOW (ref 3.87–5.11)
RDW: 17.3 % — ABNORMAL HIGH (ref 11.5–15.5)
WBC: 5.6 10*3/uL (ref 4.0–10.5)
nRBC: 0 % (ref 0.0–0.2)

## 2023-01-23 LAB — GLUCOSE, CAPILLARY
Glucose-Capillary: 149 mg/dL — ABNORMAL HIGH (ref 70–99)
Glucose-Capillary: 152 mg/dL — ABNORMAL HIGH (ref 70–99)
Glucose-Capillary: 151 mg/dL — ABNORMAL HIGH (ref 70–99)
Glucose-Capillary: 158 mg/dL — ABNORMAL HIGH (ref 70–99)

## 2023-01-23 NOTE — Progress Notes (Signed)
Progress Note   Patient: Tracy Spears:[id number] DOB: [birth year] DOA: 01/20/2023     3 DOS: the patient was seen and examined on 01/23/2023      Subjective:  Patient seen and examined at bedside this morning in the presence of the husband Sodium levels improving She admits to improvement in her weakness Denies nausea vomiting chest pain diarrhea or abdominal pain       Brief hospital course: [name] is a 89 year old female with history of hypertension, non-insulin-dependent diabetes mellitus, GERD, neuropathy, who presents emergency department for chief concerns of abnormal labs from PCP clinic.   Vitals in the ED showed temperature of 98.5, respiration rate of 18, heart rate of 63, blood pressure 151/69, SpO2 of 95% on room air.    Serum sodium is 119, potassium 3.6, chloride 83, bicarb 25, BUN of 6, serum creatinine 0.55, EGFR greater than 60, nonfasting blood glucose 143, WBC 6.6, hemoglobin 9.2, platelets of 277.   Portable chest x-ray showed cardiomegaly without overt failure.  Globular cardiac configuration, suspect combination of chamber enlargement and pericardial effusion.   Of note, we reviewed CTA PE done on 01/14/2023: Read as moderate pericardial effusion.  No evidence of PE.  Mild cardiomegaly.  Hiatal hernia.  Aortic atherosclerosis.     Assessment and Plan:   Hypotonic Hyponatremia-improving Serum osmole at 252, very low.  Patient did mention that she is drinking a lot of liquids, Gatorade and water. Urine osmolarity 130, urine sodium 46 consistent with hyponatremia concerning for compulsive water drinking  Continue fluid restriction 1.5 L/day Continue sodium chloride 2 g p.o. 3 times daily x 2 days Monitor sodium level closely Holding hydrochlorothiazide at this time on account of hyponatremia   # Hypokalemia, potassium repleted. # Hypomagnesemia, mag repleted. # Hypophosphatemia, Phos repleted. Check electrolytes and replete as needed.      HTN, HLD TTE LVEF 60-65%, no pericardial effusion Resumed amlodipine, Coreg, statin home dose Monitor BP and titrate medications accordingly.   NIDDM T2, A1C 7.1 well controlled Hold home meds Started NovoLog sliding scale   Anemia of ch. Disease Iron, folate and B12 wnl Monitor H&H   GERD on PPI      Diet: Carb modified diet DVT Prophylaxis: Subcutaneous Lovenox    Advance goals of care discussion: Full code   Family Communication: Discussed with husband present at bedside   Disposition:  Discharge to Home, when clinically stable, may need few days to improve.Marland Kitchen     Physical Exam: General: NAD, lying comfortably Appear in no distress, affect appropriate Eyes: PERRLA ENT: Oral Mucosa Clear, moist  Neck: no JVD,  Cardiovascular: S1 and S2 Present, no Murmur,  Respiratory: good respiratory effort,  Abdomen: Bowel Sound present, Soft and no tenderness,  Skin: no rashes Extremities: no Pedal edema, no calf tenderness Neurologic: without any new focal findings Gait not checked due to patient safety concerns    Data Reviewed: I have personally reviewed patient's laboratory results showing improvement in sodium   Time spent: 40 minutes  Vitals:   01/22/23 2014 01/22/23 2321 01/23/23 0421 01/23/23 0810  BP: (!) 140/76 (!) 121/53 139/66 (!) 160/66  Pulse: 62 62 61 62  Resp: 20 20 20 18   Temp: 98.1 F (36.7 C) 98.3 F (36.8 C) 98.2 F (36.8 C) 98 F (36.7 C)  TempSrc: Oral Oral Oral Oral  SpO2: 96% 97% 95% 98%  Weight:      Height:         Author: Criss Alvine T  Meriam Sprague, MD 01/23/2023 11:38 AM  For on call review [email or web address].

## 2023-01-24 DIAGNOSIS — E871 Hypo-osmolality and hyponatremia: Secondary | ICD-10-CM | POA: Diagnosis not present

## 2023-01-24 LAB — CBC WITH DIFFERENTIAL/PLATELET
Abs Immature Granulocytes: 0.02 10*3/uL (ref 0.00–0.07)
Basophils Absolute: 0 10*3/uL (ref 0.0–0.1)
Basophils Relative: 1 %
Eosinophils Absolute: 0.1 10*3/uL (ref 0.0–0.5)
Eosinophils Relative: 2 %
HCT: 25.6 % — ABNORMAL LOW (ref 36.0–46.0)
Hemoglobin: 8.3 g/dL — ABNORMAL LOW (ref 12.0–15.0)
Immature Granulocytes: 0 %
Lymphocytes Relative: 32 %
Lymphs Abs: 1.8 10*3/uL (ref 0.7–4.0)
MCH: 25.7 pg — ABNORMAL LOW (ref 26.0–34.0)
MCHC: 32.4 g/dL (ref 30.0–36.0)
MCV: 79.3 fL — ABNORMAL LOW (ref 80.0–100.0)
Monocytes Absolute: 0.5 10*3/uL (ref 0.1–1.0)
Monocytes Relative: 9 %
Neutro Abs: 3.1 10*3/uL (ref 1.7–7.7)
Neutrophils Relative %: 56 %
Platelets: 274 10*3/uL (ref 150–400)
RBC: 3.23 MIL/uL — ABNORMAL LOW (ref 3.87–5.11)
RDW: 18.1 % — ABNORMAL HIGH (ref 11.5–15.5)
WBC: 5.6 10*3/uL (ref 4.0–10.5)
nRBC: 0 % (ref 0.0–0.2)

## 2023-01-24 LAB — BASIC METABOLIC PANEL
Anion gap: 6 (ref 5–15)
BUN: 19 mg/dL (ref 8–23)
CO2: 27 mmol/L (ref 22–32)
Calcium: 9.4 mg/dL (ref 8.9–10.3)
Chloride: 100 mmol/L (ref 98–111)
Creatinine, Ser: 0.74 mg/dL (ref 0.44–1.00)
GFR, Estimated: >60 mL/min (ref >60)
Glucose, Bld: 132 mg/dL — ABNORMAL HIGH (ref 70–99)
Potassium: 3.9 mmol/L (ref 3.5–5.1)
Sodium: 133 mmol/L — ABNORMAL LOW (ref 135–145)

## 2023-01-24 MED ORDER — LORATADINE 10 MG PO TABS
10.0000 mg | ORAL_TABLET | Freq: Every day | ORAL | Status: DC
  Administered 2023-01-24: 10 mg via ORAL
  Filled 2023-01-24: qty 1

## 2023-01-24 MED ORDER — LOSARTAN POTASSIUM 100 MG PO TABS: 100.0000 mg | ORAL_TABLET | Freq: Every day | ORAL | 0 refills | Status: DC

## 2023-01-24 MED ORDER — POLYVINYL ALCOHOL 1.4 % OP SOLN
1.0000 [drp] | OPHTHALMIC | Status: DC | PRN
  Filled 2023-01-24: qty 15

## 2023-01-24 MED ORDER — POLYETHYLENE GLYCOL 3350 17 G PO PACK
17.0000 g | PACK | Freq: Two times a day (BID) | ORAL | Status: DC
  Administered 2023-01-24: 17 g via ORAL
  Filled 2023-01-24: qty 1

## 2023-01-24 NOTE — Progress Notes (Signed)
Discharge instructions provided to patient, patient's husband and patient's son. Verbalized understanding of all instructions, including medication change.

## 2023-01-24 NOTE — Progress Notes (Signed)
Patient's son and husband called to let them know she will be discharging. They are on their way to get her now.

## 2023-01-24 NOTE — TOC Transition Note (Signed)
Transition of Care [facility]) - CM/SW Discharge Note   Patient Details  Name: Tracy Spears MRN: [id number] Date of Birth: 28-Oct-1932  Transition of Care [facility] CM/SW Contact:  Bing Quarry, RN Phone Number: 01/24/2023, 11:37 AM   Clinical Narrative: 01/24/23: Referred from PCP to ED for abnormal lab work and weakness. DC orders in for today to home/self care. No TOC needs identified in discharge orders.  Mobility specialist saw patient this am. No PT or OT specific discharge recommendations; PRN.  RD consulted patient for poor appetite per family report. TOC will sign off but please call if situational needs change. Gabriel Cirri RN CM [phone] Jacquelyne Balint).     Final next level of care: Home/Self Care     Patient Goals and CMS Choice   Choice offered to / list presented to : NA  Discharge Placement                         Discharge Plan and Services Additional resources added to the After Visit Summary for                  DME Arranged: N/A DME Agency: NA       HH Arranged: NA HH Agency: NA        Social Determinants of Health (SDOH) Interventions SDOH Screenings   Food Insecurity: No Food Insecurity (01/21/2023)  Housing: Low Risk  (01/21/2023)  Transportation Needs: No Transportation Needs (01/21/2023)  Utilities: Not At Risk (01/21/2023)  Tobacco Use: Low Risk  (01/20/2023)     Readmission Risk Interventions     No data to display

## 2023-01-24 NOTE — Discharge Summary (Addendum)
Physician Discharge Summary  Tracy Spears:[id number] DOB: 03/13/1933 DOA: 01/20/2023  PCP: Lauro Regulus, MD  Admit date: 01/20/2023 Discharge date: 01/24/2023  Admitted From: Home Disposition: Home  Recommendations for Outpatient Follow-up:  Follow up with PCP in 1-2 weeks Please obtain BMP/CBC in 2-3 days Discontinue HCTZ.  Continue losartan   Discharge Condition: Stable CODE STATUS: Full code Diet recommendation: Diabetic  Brief/Interim Summary:  89 year old female with history of hypertension, non-insulin-dependent diabetes mellitus, GERD, neuropathy, who presents emergency department for chief concerns of abnormal labs from PCP clinic.  Upon admission patient had clinical evidence of dehydration and was noted to be hyponatremic.  During the hospitalization she received salt tablets and was placed on fluid restriction as patient did have increase in intake of free water at home.  Sodium levels slowly reviewed.  On the day of discharge she was doing very well and did not have any complaints.  PT/OT did not have any further follow-up recommendation.  Medically stable for discharge   [facility], no answer. Voicemail box full.   Hypotonic Hyponatremia-improving Urine osmolarity 130, urine sodium 46 consistent with hyponatremia concerning for compulsive water drinking  During the hospitalization she was placed on free water fluid restriction and placed on salt tabs.  Her sodium levels improved.  HCTZ was discontinued.  Continuing losartan upon discharge.   # Hypokalemia, potassium repleted. # Hypomagnesemia, mag repleted. # Hypophosphatemia, Phos repleted. Check electrolytes and replete as needed.     HTN, HLD TTE LVEF 60-65%, no pericardial effusion Resumed amlodipine, Coreg, statin home dose Continue losartan.  Discontinue HCTZ   NIDDM T2, A1C 7.1 well controlled Resume home meds   Anemia of ch. Disease Iron, folate and B12 wnl Monitor H&H   GERD on  PPI        Discharge Diagnoses:  Principal Problem:   Hyponatremia Active Problems:   Essential hypertension   Diabetes mellitus type 2, noninsulin dependent (HCC)   Hyperlipidemia   GERD (gastroesophageal reflux disease)   Pericardial effusion   Weakness      Consultations: None  Subjective: Feeling well no complaints.  Wishes to go home  Discharge Exam: Vitals:   01/24/23 0421 01/24/23 0735  BP: 138/65 (!) 146/59  Pulse: 62 64  Resp: 18 18  Temp: 98.2 F (36.8 C) 98 F (36.7 C)  SpO2: 96% 99%   Vitals:   01/23/23 1613 01/23/23 2156 01/24/23 0421 01/24/23 0735  BP: (!) 129/53 (!) 150/62 138/65 (!) 146/59  Pulse: 63 67 62 64  Resp: 16 18 18 18   Temp: 98.2 F (36.8 C) 98.2 F (36.8 C) 98.2 F (36.8 C) 98 F (36.7 C)  TempSrc: Oral Oral  Oral  SpO2: 96% 100% 96% 99%  Weight:      Height:        General: Pt is alert, awake, not in acute distress Cardiovascular: RRR, S1/S2 +, no rubs, no gallops Respiratory: CTA bilaterally, no wheezing, no rhonchi Abdominal: Soft, NT, ND, bowel sounds + Extremities: no edema, no cyanosis  Discharge Instructions   Allergies as of 01/24/2023       Reactions   Lisinopril Other (See Comments)   cough        Medication List     STOP taking these medications    lidocaine 4 % Commonly known as: HM Lidocaine Patch   losartan-hydrochlorothiazide 100-25 MG tablet Commonly known as: HYZAAR   potassium chloride 10 MEQ tablet Commonly known as: Jerene Dilling  TAKE these medications    amLODipine 5 MG tablet Commonly known as: NORVASC Take 5 mg by mouth daily.   aspirin EC 81 MG tablet Take 81 mg by mouth daily.   atorvastatin 10 MG tablet Commonly known as: LIPITOR Take 1 tablet by mouth daily.   azelastine 0.1 % nasal spray Commonly known as: ASTELIN Place 2 sprays into both nostrils daily as needed for allergies or rhinitis.   carvedilol 25 MG tablet Commonly known as: COREG Take 25 mg by  mouth 2 (two) times daily.   Cholecalciferol 50 MCG (2000 UT) Caps Take 1 capsule by mouth daily.   colchicine 0.6 MG tablet Take 0.6 mg by mouth daily as needed.   diclofenac Sodium 1 % Gel Commonly known as: Voltaren Apply 2 g topically 3 (three) times daily as needed.   gabapentin 300 MG capsule Commonly known as: NEURONTIN Take 1 capsule by mouth 3 (three) times daily.   losartan 100 MG tablet Commonly known as: COZAAR Take 1 tablet (100 mg total) by mouth daily.   metFORMIN 500 MG 24 hr tablet Commonly known as: GLUCOPHAGE-XR Take 1,500 mg by mouth at bedtime.   Multi-Vitamin tablet Take 1 tablet by mouth daily.   pantoprazole 40 MG tablet Commonly known as: PROTONIX Take 1 tablet by mouth daily.   triamcinolone cream 0.1 % Commonly known as: KENALOG Apply to itchy rash on body 1-2 times daily until improved. Avoid face, groin, underarms. What changed:  how much to take how to take this when to take this reasons to take this        Follow-up Information     Lauro Regulus, MD Follow up in 1 week(s).   Specialty: Internal Medicine Why: Patient to make own appointment Contact information: [address] [facility] [place] [address] [phone]                Allergies  Allergen Reactions   Lisinopril Other (See Comments)    cough    You were cared for by a hospitalist during your hospital stay. If you have any questions about your discharge medications or the care you received while you were in the hospital after you are discharged, you can call the unit and asked to speak with the hospitalist on call if the hospitalist that took care of you is not available. Once you are discharged, your primary care physician will handle any further medical issues. Please note that no refills for any discharge medications will be authorized once you are discharged, as it is imperative that you return to your primary care physician  (or establish a relationship with a primary care physician if you do not have one) for your aftercare needs so that they can reassess your need for medications and monitor your lab values.  You were cared for by a hospitalist during your hospital stay. If you have any questions about your discharge medications or the care you received while you were in the hospital after you are discharged, you can call the unit and asked to speak with the hospitalist on call if the hospitalist that took care of you is not available. Once you are discharged, your primary care physician will handle any further medical issues. Please note that NO REFILLS for any discharge medications will be authorized once you are discharged, as it is imperative that you return to your primary care physician (or establish a relationship with a primary care physician if you do  not have one) for your aftercare needs so that they can reassess your need for medications and monitor your lab values.  Please request your Prim.MD to go over all Hospital Tests and Procedure/Radiological results at the follow up, please get all Hospital records sent to your Prim MD by signing hospital release before you go home.  Get CBC, CMP, 2 view Chest X ray checked  by Primary MD during your next visit or SNF MD in 5-7 days ( we routinely change or add medications that can affect your baseline labs and fluid status, therefore we recommend that you get the mentioned basic workup next visit with your PCP, your PCP may decide not to get them or add new tests based on their clinical decision)  On your next visit with your primary care physician please Get Medicines reviewed and adjusted.  If you experience worsening of your admission symptoms, develop shortness of breath, life threatening emergency, suicidal or homicidal thoughts you must seek medical attention immediately by calling 911 or calling your MD immediately  if symptoms less severe.  You Must read  complete instructions/literature along with all the possible adverse reactions/side effects for all the Medicines you take and that have been prescribed to you. Take any new Medicines after you have completely understood and accpet all the possible adverse reactions/side effects.   Do not drive, operate heavy machinery, perform activities at heights, swimming or participation in water activities or provide baby sitting services if your were admitted for syncope or siezures until you have seen by Primary MD or a Neurologist and advised to do so again.  Do not drive when taking Pain medications.   Procedures/Studies: ECHOCARDIOGRAM COMPLETE  Result Date: 01/21/2023    ECHOCARDIOGRAM REPORT   Patient Name:   DEYCI RABADI Date of Exam: 01/20/2023 Medical Rec #:  [id number]         Height:       60.2 in Accession #:    [id number]        Weight:       121.3 lb Date of Birth:  1933-02-06          BSA:          1.513 m Patient Age:    89 years          BP:           151/69 mmHg Patient Gender: F                 HR:           52 bpm. Exam Location:  [facility] Procedure: 2D Echo, Cardiac Doppler and Color Doppler Indications:     I31.3 Pericardial effusion  History:         Patient has no prior history of Echocardiogram examinations.                  Risk Factors:Hypertension and Diabetes.  Sonographer:     Daphine Deutscher RDCS Referring Phys:  [id number] AMY N COX Diagnosing Phys: Debbe Odea MD IMPRESSIONS  1. Left ventricular ejection fraction, by estimation, is 60 to 65%. The left ventricle has normal function. The left ventricle has no regional wall motion abnormalities. Left ventricular diastolic parameters are consistent with Grade II diastolic dysfunction (pseudonormalization).  2. Right ventricular systolic function is normal. The right ventricular size is normal.  3. Left atrial size was severely dilated.  4. The mitral valve is grossly normal. Moderate mitral valve regurgitation.  5. The aortic  valve is tricuspid. Aortic valve regurgitation is not visualized. Aortic valve sclerosis/calcification is present, without any evidence of aortic stenosis.  6. Aortic dilatation noted. There is borderline dilatation of the ascending aorta, measuring 38 mm.  7. The inferior vena cava is normal in size with greater than 50% respiratory variability, suggesting right atrial pressure of 3 mmHg. FINDINGS  Left Ventricle: Left ventricular ejection fraction, by estimation, is 60 to 65%. The left ventricle has normal function. The left ventricle has no regional wall motion abnormalities. The left ventricular internal cavity size was normal in size. There is  no left ventricular hypertrophy. Left ventricular diastolic parameters are consistent with Grade II diastolic dysfunction (pseudonormalization). Right Ventricle: The right ventricular size is normal. No increase in right ventricular wall thickness. Right ventricular systolic function is normal. Left Atrium: Left atrial size was severely dilated. Right Atrium: Right atrial size was normal in size. Pericardium: There is no evidence of pericardial effusion. Mitral Valve: The mitral valve is grossly normal. Moderate mitral valve regurgitation. Tricuspid Valve: The tricuspid valve is normal in structure. Tricuspid valve regurgitation is mild. Aortic Valve: The aortic valve is tricuspid. Aortic valve regurgitation is not visualized. Aortic valve sclerosis/calcification is present, without any evidence of aortic stenosis. Pulmonic Valve: The pulmonic valve was normal in structure. Pulmonic valve regurgitation is trivial. Aorta: Aortic dilatation noted. There is borderline dilatation of the ascending aorta, measuring 38 mm. Venous: The inferior vena cava is normal in size with greater than 50% respiratory variability, suggesting right atrial pressure of 3 mmHg. IAS/Shunts: No atrial level shunt detected by color flow Doppler.  LEFT VENTRICLE PLAX 2D LVIDd:         4.20 cm    Diastology LVIDs:         2.50 cm   LV e' medial:    5.33 cm/s LV PW:         0.80 cm   LV E/e' medial:  20.5 LV IVS:        1.00 cm   LV e' lateral:   6.85 cm/s LVOT diam:     1.70 cm   LV E/e' lateral: 15.9 LV SV:         51 LV SV Index:   34 LVOT Area:     2.27 cm  RIGHT VENTRICLE             IVC RV Basal diam:  3.00 cm     IVC diam: 1.10 cm RV S prime:     13.10 cm/s TAPSE (M-mode): 2.3 cm LEFT ATRIUM             Index        RIGHT ATRIUM          Index LA diam:        5.20 cm 3.44 cm/m   RA Area:     8.48 cm LA Vol (A2C):   47.6 ml 31.45 ml/m  RA Volume:   17.20 ml 11.37 ml/m LA Vol (A4C):   79.9 ml 52.80 ml/m LA Biplane Vol: 62.3 ml 41.17 ml/m  AORTIC VALVE LVOT Vmax:   100.75 cm/s LVOT Vmean:  64.500 cm/s LVOT VTI:    0.225 m  AORTA Ao Root diam: 3.40 cm Ao Asc diam:  3.80 cm MITRAL VALVE                TRICUSPID VALVE MV Area (PHT): 3.42 cm     TR Peak grad:   28.1 mmHg MV Decel Time: 222  msec     TR Vmax:        265.00 cm/s MV E velocity: 109.00 cm/s MV A velocity: 96.60 cm/s   SHUNTS MV E/A ratio:  1.13         Systemic VTI:  0.22 m                             Systemic Diam: 1.70 cm Debbe Odea MD Electronically signed by Debbe Odea MD Signature Date/Time: 01/21/2023/7:57:49 AM    Final    DG Chest Portable 1 View  Result Date: 01/20/2023 CLINICAL DATA:  Assess for edema EXAM: PORTABLE CHEST 1 VIEW COMPARISON:  CT 01/14/2023 FINDINGS: Cardiomegaly with slightly globular cardiac configuration. No overt pulmonary edema or pleural effusion. No focal airspace disease. Advanced arthropathy left shoulder with irregular appearing glenohumeral joint space. IMPRESSION: 1. Cardiomegaly without overt failure. Globular cardiac configuration, suspect combination of chamber enlargement and pericardial effusion. Electronically Signed   By: Jasmine Pang M.D.   On: 01/20/2023 16:48   CT Angio Chest Pulmonary Embolism (PE) W or WO Contrast  Result Date: 01/14/2023 CLINICAL DATA:  Nasal  congestion, cough and wheezing, bronchitis, malaise, chills EXAM: CT ANGIOGRAPHY CHEST WITH CONTRAST TECHNIQUE: Multidetector CT imaging of the chest was performed using the standard protocol during bolus administration of intravenous contrast. Multiplanar CT image reconstructions and MIPs were obtained to evaluate the vascular anatomy. RADIATION DOSE REDUCTION: This exam was performed according to the departmental dose-optimization program which includes automated exposure control, adjustment of the mA and/or kV according to patient size and/or use of iterative reconstruction technique. CONTRAST:  75mL OMNIPAQUE IOHEXOL 350 MG/ML SOLN COMPARISON:  None Available. FINDINGS: Cardiovascular: This is a technically adequate evaluation of the pulmonary vasculature. No filling defects or pulmonary emboli. Mild cardiomegaly with prominent left atrial dilatation. Dense calcification of the mitral annulus. Moderate pericardial effusion measuring up to 2.2 cm in greatest thickness. No evidence of thoracic aortic aneurysm or dissection. Atherosclerosis of the aorta and coronary vasculature. Mediastinum/Nodes: No pathologic adenopathy. Thyroid, trachea, and esophagus appear grossly unremarkable. There is a small hiatal hernia. Lungs/Pleura: Patchy areas of dependent consolidation within the lower lobes likely reflect atelectasis. No significant pleural effusion. No pneumothorax. Central airways are patent. Upper Abdomen: No acute abnormality. Musculoskeletal: No acute or destructive bony lesions. Severe bilateral shoulder osteoarthritis, left greater than right. Chronic compression deformities of the T8 and T11 vertebral bodies. Reconstructed images demonstrate no additional findings. Review of the MIP images confirms the above findings. IMPRESSION: 1. No evidence of pulmonary embolus. 2. Moderate pericardial effusion. 3. Mild cardiomegaly with prominent left atrial dilatation. Dense calcification of the mitral annulus. 4.  Patchy areas of consolidation at the lung bases, favoring atelectasis over acute pneumonia. 5. Hiatal hernia. 6.  Aortic Atherosclerosis (ICD10-I70.0). Electronically Signed   By: Sharlet Salina M.D.   On: 01/14/2023 17:58   US Venous Img Lower Unilateral Left (DVT)  Result Date: 01/14/2023 CLINICAL DATA:  Edema, shortness of breath. EXAM: Left LOWER EXTREMITY VENOUS DOPPLER ULTRASOUND TECHNIQUE: Gray-scale sonography with compression, as well as color and duplex ultrasound, were performed to evaluate the deep venous system(s) from the level of the common femoral vein through the popliteal and proximal calf veins. COMPARISON:  None Available. FINDINGS: VENOUS Normal compressibility of the common femoral, superficial femoral, and popliteal veins, as well as the visualized calf veins. Visualized portions of profunda femoral vein and great saphenous vein unremarkable. No filling defects to suggest  DVT on grayscale or color Doppler imaging. Doppler waveforms show normal direction of venous flow, normal respiratory plasticity and response to augmentation. Limited views of the contralateral common femoral vein are unremarkable. OTHER Anechoic mass with increased through transmission in the popliteal fossa measures 7.4 x 3.3 x 5.4 cm. Limitations: None. IMPRESSION: 1. Negative for deep venous thrombosis in the left lower extremity. 2. Baker cyst. Electronically Signed   By: Leanna Battles M.D.   On: 01/14/2023 13:43     The results of significant diagnostics from this hospitalization (including imaging, microbiology, ancillary and laboratory) are listed below for reference.     Microbiology: No results found for this or any previous visit (from the past 240 hour(s)).   Labs: BNP (last 3 results) No results for input(s): "BNP" in the last 8760 hours. Basic Metabolic Panel: Recent Labs  Lab 01/21/23 0544 01/21/23 1733 01/22/23 0545 01/22/23 1228 01/22/23 1939 01/23/23 0414 01/24/23 0514  NA 120*   <  > 126* 125* 128* 129* 133*  K 3.2*   < > 3.9 3.9 4.3 4.2 3.9  CL 86*   < > 92* 91* 95* 97* 100  CO2 26   < > 26 26 23 27 27   GLUCOSE 129*   < > 113* 152* 179* 135* 132*  BUN 7*   < > 9 12 15 18 19   CREATININE 0.59   < > 0.76 0.78 0.78 0.76 0.74  CALCIUM 8.7*   < > 8.7* 8.9 9.1 9.1 9.4  MG 0.9*  --  1.7  --   --   --   --   PHOS 2.0*  --  3.3  --   --   --   --    < > = values in this interval not displayed.   Liver Function Tests: Recent Labs  Lab 01/20/23 1237  AST 38  ALT 19  ALKPHOS 57  BILITOT 0.8  PROT 6.8  ALBUMIN 3.5   No results for input(s): "LIPASE", "AMYLASE" in the last 168 hours. No results for input(s): "AMMONIA" in the last 168 hours. CBC: Recent Labs  Lab 01/20/23 1237 01/21/23 0544 01/22/23 0545 01/23/23 0414 01/24/23 0514  WBC 6.6 6.1 5.5 5.6 5.6  NEUTROABS 4.6  --   --  3.4 3.1  HGB 9.2* 8.7* 8.4* 8.4* 8.3*  HCT 27.2* 26.5* 25.3* 25.8* 25.6*  MCV 75.8* 75.5* 76.7* 78.7* 79.3*  PLT 277 267 250 279 274   Cardiac Enzymes: No results for input(s): "CKTOTAL", "CKMB", "CKMBINDEX", "TROPONINI" in the last 168 hours. BNP: Invalid input(s): "POCBNP" CBG: Recent Labs  Lab 01/22/23 2020 01/23/23 0807 01/23/23 1223 01/23/23 1658 01/23/23 2156  GLUCAP 176* 149* 152* 151* 158*   D-Dimer No results for input(s): "DDIMER" in the last 72 hours. Hgb A1c No results for input(s): "HGBA1C" in the last 72 hours. Lipid Profile No results for input(s): "CHOL", "HDL", "LDLCALC", "TRIG", "CHOLHDL", "LDLDIRECT" in the last 72 hours. Thyroid function studies No results for input(s): "TSH", "T4TOTAL", "T3FREE", "THYROIDAB" in the last 72 hours.  Invalid input(s): "FREET3" Anemia work up No results for input(s): "VITAMINB12", "FOLATE", "FERRITIN", "TIBC", "IRON", "RETICCTPCT" in the last 72 hours. Urinalysis No results found for: "COLORURINE", "APPEARANCEUR", "LABSPEC", "PHURINE", "GLUCOSEU", "HGBUR", "BILIRUBINUR", "KETONESUR", "PROTEINUR", "UROBILINOGEN",  "NITRITE", "LEUKOCYTESUR" Sepsis Labs Recent Labs  Lab 01/21/23 0544 01/22/23 0545 01/23/23 0414 01/24/23 0514  WBC 6.1 5.5 5.6 5.6   Microbiology No results found for this or any previous visit (from the past 240 hour(s)).  Time coordinating discharge:  I have spent 35 minutes face to face with the patient and on the ward discussing the patients care, assessment, plan and disposition with other care givers. >50% of the time was devoted counseling the patient about the risks and benefits of treatment/Discharge disposition and coordinating care.   SIGNED:   Dimple Nanas, MD  [facility] 01/24/2023, 12:54 PM   If 7PM-7AM, please contact night-coverage

## 2023-01-24 NOTE — Progress Notes (Signed)
Mobility Specialist - Progress Note   01/24/23 0925  Mobility  Activity Ambulated independently in hallway;Stood at bedside;Dangled on edge of bed  Level of Assistance Independent  Assistive Device Front wheel walker  Distance Ambulated (ft) 160 ft  Activity Response Tolerated well  Mobility Referral Yes  $Mobility charge 1 Mobility  Mobility Specialist Start Time (ACUTE ONLY) [id number]  Mobility Specialist Stop Time (ACUTE ONLY) 0913  Mobility Specialist Time Calculation (min) (ACUTE ONLY) 10 min   Pt sitting EOB on RA upon arrival. Pt STS and ambulates in hallway indep. Pt returns to recliner with needs in reach.   Tracy Spears  Mobility Specialist  01/24/23 9:26 AM

## 2023-01-24 NOTE — Plan of Care (Signed)
  Problem: Education: Goal: Ability to describe self-care measures that may prevent or decrease complications (Diabetes Survival Skills Education) will improve Outcome: Progressing   Problem: Skin Integrity: Goal: Risk for impaired skin integrity will decrease Outcome: Progressing   

## 2023-01-28 DIAGNOSIS — D649 Anemia, unspecified: Secondary | ICD-10-CM | POA: Diagnosis not present

## 2023-01-28 DIAGNOSIS — N1832 Chronic kidney disease, stage 3b: Secondary | ICD-10-CM | POA: Diagnosis not present

## 2023-01-28 DIAGNOSIS — E1169 Type 2 diabetes mellitus with other specified complication: Secondary | ICD-10-CM | POA: Diagnosis not present

## 2023-01-28 DIAGNOSIS — R634 Abnormal weight loss: Secondary | ICD-10-CM | POA: Diagnosis not present

## 2023-01-28 DIAGNOSIS — E785 Hyperlipidemia, unspecified: Secondary | ICD-10-CM | POA: Diagnosis not present

## 2023-01-28 DIAGNOSIS — I10 Essential (primary) hypertension: Secondary | ICD-10-CM | POA: Diagnosis not present

## 2023-01-28 DIAGNOSIS — E1122 Type 2 diabetes mellitus with diabetic chronic kidney disease: Secondary | ICD-10-CM | POA: Diagnosis not present

## 2023-01-28 LAB — VITAMIN D 25 HYDROXY (VIT D DEFICIENCY, FRACTURES)

## 2023-02-02 ENCOUNTER — Other Ambulatory Visit: Payer: Self-pay | Admitting: Internal Medicine

## 2023-02-02 DIAGNOSIS — R634 Abnormal weight loss: Secondary | ICD-10-CM

## 2023-02-02 DIAGNOSIS — D649 Anemia, unspecified: Secondary | ICD-10-CM

## 2023-02-04 DIAGNOSIS — E871 Hypo-osmolality and hyponatremia: Secondary | ICD-10-CM | POA: Diagnosis not present

## 2023-02-04 DIAGNOSIS — D649 Anemia, unspecified: Secondary | ICD-10-CM | POA: Diagnosis not present

## 2023-02-09 DIAGNOSIS — E871 Hypo-osmolality and hyponatremia: Secondary | ICD-10-CM | POA: Diagnosis not present

## 2023-02-09 DIAGNOSIS — D500 Iron deficiency anemia secondary to blood loss (chronic): Secondary | ICD-10-CM | POA: Diagnosis not present

## 2023-02-09 DIAGNOSIS — K5900 Constipation, unspecified: Secondary | ICD-10-CM | POA: Diagnosis not present

## 2023-02-09 DIAGNOSIS — R634 Abnormal weight loss: Secondary | ICD-10-CM | POA: Diagnosis not present

## 2023-02-10 ENCOUNTER — Ambulatory Visit
Admission: RE | Admit: 2023-02-10 | Discharge: 2023-02-10 | Disposition: A | Discharge status: Home / Self Care | Payer: PPO | Source: Ambulatory Visit | Attending: Internal Medicine | Admitting: Internal Medicine

## 2023-02-10 DIAGNOSIS — N189 Chronic kidney disease, unspecified: Secondary | ICD-10-CM | POA: Diagnosis not present

## 2023-02-10 DIAGNOSIS — D649 Anemia, unspecified: Secondary | ICD-10-CM

## 2023-02-10 DIAGNOSIS — R634 Abnormal weight loss: Secondary | ICD-10-CM | POA: Insufficient documentation

## 2023-02-10 DIAGNOSIS — E1122 Type 2 diabetes mellitus with diabetic chronic kidney disease: Secondary | ICD-10-CM | POA: Diagnosis not present

## 2023-02-10 MED ORDER — IOHEXOL 300 MG/ML  SOLN
100.0000 mL | Freq: Once | INTRAMUSCULAR | Status: AC | PRN
  Administered 2023-02-10: 100 mL via INTRAVENOUS

## 2023-02-15 DIAGNOSIS — D500 Iron deficiency anemia secondary to blood loss (chronic): Secondary | ICD-10-CM | POA: Diagnosis not present

## 2023-02-25 ENCOUNTER — Other Ambulatory Visit: Payer: Self-pay | Admitting: Gastroenterology

## 2023-02-25 DIAGNOSIS — D500 Iron deficiency anemia secondary to blood loss (chronic): Secondary | ICD-10-CM

## 2023-03-01 DIAGNOSIS — N1832 Chronic kidney disease, stage 3b: Secondary | ICD-10-CM | POA: Diagnosis not present

## 2023-03-01 DIAGNOSIS — E1169 Type 2 diabetes mellitus with other specified complication: Secondary | ICD-10-CM | POA: Diagnosis not present

## 2023-03-01 DIAGNOSIS — E785 Hyperlipidemia, unspecified: Secondary | ICD-10-CM | POA: Diagnosis not present

## 2023-03-01 DIAGNOSIS — E1122 Type 2 diabetes mellitus with diabetic chronic kidney disease: Secondary | ICD-10-CM | POA: Diagnosis not present

## 2023-03-01 DIAGNOSIS — I10 Essential (primary) hypertension: Secondary | ICD-10-CM | POA: Diagnosis not present

## 2023-03-03 ENCOUNTER — Ambulatory Visit
Admission: RE | Admit: 2023-03-03 | Discharge: 2023-03-03 | Disposition: A | Discharge status: Home / Self Care | Payer: PPO | Source: Ambulatory Visit | Attending: Gastroenterology | Admitting: Gastroenterology

## 2023-03-03 DIAGNOSIS — K449 Diaphragmatic hernia without obstruction or gangrene: Secondary | ICD-10-CM | POA: Diagnosis not present

## 2023-03-03 DIAGNOSIS — D500 Iron deficiency anemia secondary to blood loss (chronic): Secondary | ICD-10-CM | POA: Diagnosis not present

## 2023-03-03 DIAGNOSIS — R1310 Dysphagia, unspecified: Secondary | ICD-10-CM | POA: Diagnosis not present

## 2023-03-03 DIAGNOSIS — K219 Gastro-esophageal reflux disease without esophagitis: Secondary | ICD-10-CM | POA: Diagnosis not present

## 2023-03-08 DIAGNOSIS — E1122 Type 2 diabetes mellitus with diabetic chronic kidney disease: Secondary | ICD-10-CM | POA: Diagnosis not present

## 2023-03-08 DIAGNOSIS — Z0000 Encounter for general adult medical examination without abnormal findings: Secondary | ICD-10-CM | POA: Diagnosis not present

## 2023-03-08 DIAGNOSIS — I10 Essential (primary) hypertension: Secondary | ICD-10-CM | POA: Diagnosis not present

## 2023-03-08 DIAGNOSIS — E1169 Type 2 diabetes mellitus with other specified complication: Secondary | ICD-10-CM | POA: Diagnosis not present

## 2023-03-08 DIAGNOSIS — E785 Hyperlipidemia, unspecified: Secondary | ICD-10-CM | POA: Diagnosis not present

## 2023-03-08 DIAGNOSIS — N1832 Chronic kidney disease, stage 3b: Secondary | ICD-10-CM | POA: Diagnosis not present

## 2023-03-23 DIAGNOSIS — R600 Localized edema: Secondary | ICD-10-CM | POA: Diagnosis not present

## 2023-04-05 DIAGNOSIS — R600 Localized edema: Secondary | ICD-10-CM | POA: Diagnosis not present

## 2023-04-06 DIAGNOSIS — R634 Abnormal weight loss: Secondary | ICD-10-CM | POA: Diagnosis not present

## 2023-04-06 DIAGNOSIS — R1319 Other dysphagia: Secondary | ICD-10-CM | POA: Diagnosis not present

## 2023-04-06 DIAGNOSIS — E871 Hypo-osmolality and hyponatremia: Secondary | ICD-10-CM | POA: Diagnosis not present

## 2023-04-06 DIAGNOSIS — K5900 Constipation, unspecified: Secondary | ICD-10-CM | POA: Diagnosis not present

## 2023-04-06 DIAGNOSIS — D500 Iron deficiency anemia secondary to blood loss (chronic): Secondary | ICD-10-CM | POA: Diagnosis not present

## 2023-04-12 DIAGNOSIS — R634 Abnormal weight loss: Secondary | ICD-10-CM | POA: Diagnosis not present

## 2023-04-12 DIAGNOSIS — D500 Iron deficiency anemia secondary to blood loss (chronic): Secondary | ICD-10-CM | POA: Diagnosis not present

## 2023-04-26 DIAGNOSIS — E1169 Type 2 diabetes mellitus with other specified complication: Secondary | ICD-10-CM | POA: Diagnosis not present

## 2023-04-26 DIAGNOSIS — E785 Hyperlipidemia, unspecified: Secondary | ICD-10-CM | POA: Diagnosis not present

## 2023-04-26 DIAGNOSIS — I10 Essential (primary) hypertension: Secondary | ICD-10-CM | POA: Diagnosis not present

## 2023-04-26 DIAGNOSIS — N1832 Chronic kidney disease, stage 3b: Secondary | ICD-10-CM | POA: Diagnosis not present

## 2023-04-26 DIAGNOSIS — E1122 Type 2 diabetes mellitus with diabetic chronic kidney disease: Secondary | ICD-10-CM | POA: Diagnosis not present

## 2023-05-31 DIAGNOSIS — E1122 Type 2 diabetes mellitus with diabetic chronic kidney disease: Secondary | ICD-10-CM | POA: Diagnosis not present

## 2023-05-31 DIAGNOSIS — I10 Essential (primary) hypertension: Secondary | ICD-10-CM | POA: Diagnosis not present

## 2023-05-31 DIAGNOSIS — N1832 Chronic kidney disease, stage 3b: Secondary | ICD-10-CM | POA: Diagnosis not present

## 2023-05-31 DIAGNOSIS — E785 Hyperlipidemia, unspecified: Secondary | ICD-10-CM | POA: Diagnosis not present

## 2023-05-31 DIAGNOSIS — E1169 Type 2 diabetes mellitus with other specified complication: Secondary | ICD-10-CM | POA: Diagnosis not present

## 2023-06-15 ENCOUNTER — Ambulatory Visit (INDEPENDENT_AMBULATORY_CARE_PROVIDER_SITE_OTHER): Payer: PPO | Admitting: Dermatology

## 2023-06-15 VITALS — BP 139/73 | HR 55

## 2023-06-15 DIAGNOSIS — D1801 Hemangioma of skin and subcutaneous tissue: Secondary | ICD-10-CM | POA: Diagnosis not present

## 2023-06-15 DIAGNOSIS — I87303 Chronic venous hypertension (idiopathic) without complications of bilateral lower extremity: Secondary | ICD-10-CM | POA: Diagnosis not present

## 2023-06-15 DIAGNOSIS — L821 Other seborrheic keratosis: Secondary | ICD-10-CM | POA: Diagnosis not present

## 2023-06-15 DIAGNOSIS — D229 Melanocytic nevi, unspecified: Secondary | ICD-10-CM

## 2023-06-15 DIAGNOSIS — Z85828 Personal history of other malignant neoplasm of skin: Secondary | ICD-10-CM

## 2023-06-15 DIAGNOSIS — Z1283 Encounter for screening for malignant neoplasm of skin: Secondary | ICD-10-CM | POA: Diagnosis not present

## 2023-06-15 DIAGNOSIS — L578 Other skin changes due to chronic exposure to nonionizing radiation: Secondary | ICD-10-CM

## 2023-06-15 DIAGNOSIS — L814 Other melanin hyperpigmentation: Secondary | ICD-10-CM

## 2023-06-15 DIAGNOSIS — W908XXA Exposure to other nonionizing radiation, initial encounter: Secondary | ICD-10-CM | POA: Diagnosis not present

## 2023-06-15 DIAGNOSIS — L820 Inflamed seborrheic keratosis: Secondary | ICD-10-CM

## 2023-06-15 NOTE — Progress Notes (Signed)
Follow-Up Visit   Subjective  Tracy Spears is a 89 y.o. female who presents for the following: Skin Cancer Screening and Full Body Skin Exam  The patient presents for Total-Body Skin Exam (TBSE) for skin cancer screening and mole check. The patient has spots, moles and lesions to be evaluated, some may be new or changing. She has a spot on her right upper arm, scaly. She has a history of nummular dermatitis and uses TMC 0.1% cream prn. History of BCC of the right paranasal, 2011.    The following portions of the chart were reviewed this encounter and updated as appropriate: medications, allergies, medical history  Review of Systems:  No other skin or systemic complaints except as noted in HPI or Assessment and Plan.  Objective  Well appearing patient in no apparent distress; mood and affect are within normal limits.  A full examination was performed including scalp, head, eyes, ears, nose, lips, neck, chest, axillae, abdomen, back, buttocks, bilateral upper extremities, bilateral lower extremities, hands, feet, fingers, toes, fingernails, and toenails. All findings within normal limits unless otherwise noted below.   Relevant physical exam findings are noted in the Assessment and Plan.  R upper arm x 1 Erythematous stuck-on, waxy papule or plaque    Assessment & Plan   SKIN CANCER SCREENING PERFORMED TODAY.  ACTINIC DAMAGE - Chronic condition, secondary to cumulative UV/sun exposure - diffuse scaly erythematous macules with underlying dyspigmentation - Recommend daily broad spectrum sunscreen SPF 30+ to sun-exposed areas, reapply every 2 hours as needed.  - Staying in the shade or wearing long sleeves, sun glasses (UVA+UVB protection) and wide brim hats (4-inch brim around the entire circumference of the hat) are also recommended for sun protection.  - Call for new or changing lesions.  LENTIGINES, SEBORRHEIC KERATOSES, HEMANGIOMAS - Benign normal skin lesions -  Benign-appearing - Call for any changes  MELANOCYTIC NEVI - Tan-brown and/or pink-flesh-colored symmetric macules and papules - Benign appearing on exam today - Observation - Call clinic for new or changing moles - Recommend daily use of broad spectrum spf 30+ sunscreen to sun-exposed areas.   HISTORY OF BASAL CELL CARCINOMA OF THE SKIN Right paranasal 2011  - No evidence of recurrence today - Recommend regular full body skin exams - Recommend daily broad spectrum sunscreen SPF 30+ to sun-exposed areas, reapply every 2 hours as needed.  - Call if any new or changing lesions are noted between office visits  STASIS EDEMA Exam: Pitting edema of the lower legs.  Chronic and persistent condition with duration or expected duration over one year. Condition is symptomatic/ bothersome to patient. Not currently at goal.   Stasis in the legs causes chronic leg swelling, which may result in itchy or painful rashes, skin discoloration, skin texture changes, and sometimes ulceration.  Recommend daily graduated compression hose/stockings- easiest to put on first thing in morning, remove at bedtime.  Elevate legs as much as possible. Avoid salt/sodium rich foods.  Treatment Plan:  Start daily compression hose   Inflamed seborrheic keratosis R upper arm x 1  Symptomatic, irritating, patient would like treated.  Destruction of lesion - R upper arm x 1  Destruction method: cryotherapy   Informed consent: discussed and consent obtained   Lesion destroyed using liquid nitrogen: Yes   Region frozen until ice ball extended beyond lesion: Yes   Outcome: patient tolerated procedure well with no complications   Post-procedure details: wound care instructions given   Additional details:  Prior to procedure,  discussed risks of blister formation, small wound, skin dyspigmentation, or rare scar following cryotherapy. Recommend Vaseline ointment to treated areas while healing.    Return in about 1  year (around 06/14/2024) for TBSE, Hx BCC.  ICherlyn Labella, CMA, am acting as scribe for Willeen Niece, MD .   Documentation: I have reviewed the above documentation for accuracy and completeness, and I agree with the above.  Willeen Niece, MD

## 2023-06-15 NOTE — Patient Instructions (Addendum)
Cryotherapy Aftercare  Wash gently with soap and water everyday.   Apply Vaseline and Band-Aid daily until healed.    Stasis in the legs causes chronic leg swelling, which may result in itchy or painful rashes, skin discoloration, skin texture changes, and sometimes ulceration.  Recommend daily graduated compression hose/stockings- easiest to put on first thing in morning, remove at bedtime.  Elevate legs as much as possible. Avoid salt/sodium rich foods.   Due to recent changes in healthcare laws, you may see results of your pathology and/or laboratory studies on MyChart before the doctors have had a chance to review them. We understand that in some cases there may be results that are confusing or concerning to you. Please understand that not all results are received at the same time and often the doctors may need to interpret multiple results in order to provide you with the best plan of care or course of treatment. Therefore, we ask that you please give [place] 2 business days to thoroughly review all your results before contacting the office for clarification. Should we see a critical lab result, you will be contacted sooner.   If You Need Anything After Your Visit  If you have any questions or concerns for your doctor, please call our main line at [phone] and press option 4 to reach your doctor's medical assistant. If no one answers, please leave a voicemail as directed and we will return your call as soon as possible. Messages left after 4 pm will be answered the following business day.   You may also send [place] a message via MyChart. We typically respond to MyChart messages within 1-2 business days.  For prescription refills, please ask your pharmacy to contact our office. Our fax number is [phone].  If you have an urgent issue when the clinic is closed that cannot wait until the next business day, you can page your doctor at the number below.    Please note that while we do our best  to be available for urgent issues outside of office hours, we are not available 24/7.   If you have an urgent issue and are unable to reach [place], you may choose to seek medical care at your doctor's office, retail clinic, urgent care center, or emergency room.  If you have a medical emergency, please immediately call 911 or go to the emergency department.  Pager Numbers  - Dr. Gwen Pounds: [phone]  - Dr. Roseanne Reno: [phone]  - Dr. Katrinka Blazing: [phone]   In the event of inclement weather, please call our main line at [phone] for an update on the status of any delays or closures.  Dermatology Medication Tips: Please keep the boxes that topical medications come in in order to help keep track of the instructions about where and how to use these. Pharmacies typically print the medication instructions only on the boxes and not directly on the medication tubes.   If your medication is too expensive, please contact our office at [phone] option 4 or send [place] a message through MyChart.   We are unable to tell what your co-pay for medications will be in advance as this is different depending on your insurance coverage. However, we may be able to find a substitute medication at lower cost or fill out paperwork to get insurance to cover a needed medication.   If a prior authorization is required to get your medication covered by your insurance company, please allow [place] 1-2 business days to complete this process.  Drug prices  often vary depending on where the prescription is filled and some pharmacies may offer cheaper prices.  The website www.goodrx.com contains coupons for medications through different pharmacies. The prices here do not account for what the cost may be with help from insurance (it may be cheaper with your insurance), but the website can give you the price if you did not use any insurance.  - You can print the associated coupon and take it with your prescription to the  pharmacy.  - You may also stop by our office during regular business hours and pick up a GoodRx coupon card.  - If you need your prescription sent electronically to a different pharmacy, notify our office through [facility] or by phone at [phone] option 4.     Si Usted Necesita Algo Despus de Su Visita  Tambin puede enviarnos un mensaje a travs de Clinical cytogeneticist. Por lo general respondemos a los mensajes de MyChart en el transcurso de 1 a 2 das hbiles.  Para renovar recetas, por favor pida a su farmacia que se ponga en contacto con nuestra oficina. Annie Sable de fax es [place] [phone].  Si tiene un asunto urgente cuando la clnica est cerrada y que no puede esperar hasta el siguiente da hbil, puede llamar/localizar a su doctor(a) al nmero que aparece a continuacin.   Por favor, tenga en cuenta que aunque hacemos todo lo posible para estar disponibles para asuntos urgentes fuera del horario de [place], no estamos disponibles las 24 horas del da, los 7 [address]  [address] de la [place].   Si tiene un problema urgente y no puede comunicarse con nosotros, puede optar por buscar atencin mdica  en el consultorio de su doctor(a), en una clnica privada, en un centro de atencin urgente o en una sala de emergencias.  Si tiene Engineer, drilling, por favor llame inmediatamente al 911 o vaya a la sala de emergencias.  Nmeros de bper  - Dr. Gwen Pounds: [phone]  - Dra. Roseanne Reno: [phone]  - Dr. Katrinka Blazing: [phone]   En caso de inclemencias del tiempo, por favor llame a Lacy Duverney principal al [phone] para una actualizacin sobre el [place] de cualquier retraso o cierre.  Consejos para la medicacin en dermatologa: Por favor, guarde las cajas en las que vienen los medicamentos de uso tpico para ayudarle a seguir las instrucciones sobre dnde y cmo usarlos. Las farmacias generalmente imprimen las instrucciones del medicamento slo en las cajas y no directamente en los  tubos del [place].   Si su medicamento es muy caro, por favor, pngase en contacto con Rolm Gala llamando al [phone] y presione la opcin 4 o envenos un mensaje a travs de Clinical cytogeneticist.   No podemos decirle cul ser su copago por los medicamentos por adelantado ya que esto es diferente dependiendo de la cobertura de su seguro. Sin embargo, es posible que podamos encontrar un medicamento sustituto a Audiological scientist un formulario para que el seguro cubra el medicamento que se considera necesario.   Si se requiere una autorizacin previa para que su compaa de seguros [place] su medicamento, por favor permtanos de 1 a 2 das hbiles para completar [address]  Los precios de los medicamentos varan con frecuencia dependiendo del Environmental consultant de dnde se surte la receta y alguna farmacias pueden ofrecer precios ms baratos.  El sitio web www.goodrx.com tiene cupones para medicamentos de Health and safety inspector. Los precios aqu no tienen en cuenta lo que podra costar con la ayuda del seguro (puede ser ms barato  con su seguro), pero el sitio web puede darle el precio si no Visual merchandiser.  - Puede imprimir el cupn correspondiente y llevarlo con su receta a la farmacia.  - Tambin puede pasar por nuestra oficina durante el horario de atencin regular y Education officer, museum una tarjeta de cupones de GoodRx.  - Si necesita que su receta se enve electrnicamente a una farmacia diferente, informe a nuestra oficina a travs de MyChart de [place] o por telfono llamando al [phone] y presione la opcin 4.

## 2023-06-30 DIAGNOSIS — H2513 Age-related nuclear cataract, bilateral: Secondary | ICD-10-CM | POA: Diagnosis not present

## 2023-06-30 DIAGNOSIS — H02831 Dermatochalasis of right upper eyelid: Secondary | ICD-10-CM | POA: Diagnosis not present

## 2023-06-30 DIAGNOSIS — H43813 Vitreous degeneration, bilateral: Secondary | ICD-10-CM | POA: Diagnosis not present

## 2023-06-30 DIAGNOSIS — E119 Type 2 diabetes mellitus without complications: Secondary | ICD-10-CM | POA: Diagnosis not present

## 2023-07-18 ENCOUNTER — Encounter: Payer: Self-pay | Admitting: Emergency Medicine

## 2023-07-18 ENCOUNTER — Inpatient Hospital Stay
Admission: EM | Admit: 2023-07-18 | Discharge: 2023-07-26 | DRG: 481 | Disposition: A | Discharge status: Skilled Nursing Facility | Payer: PPO | Attending: Obstetrics and Gynecology | Admitting: Obstetrics and Gynecology

## 2023-07-18 ENCOUNTER — Other Ambulatory Visit: Payer: Self-pay

## 2023-07-18 ENCOUNTER — Emergency Department: Payer: PPO

## 2023-07-18 DIAGNOSIS — E669 Obesity, unspecified: Secondary | ICD-10-CM | POA: Diagnosis present

## 2023-07-18 DIAGNOSIS — M109 Gout, unspecified: Secondary | ICD-10-CM | POA: Diagnosis present

## 2023-07-18 DIAGNOSIS — R54 Age-related physical debility: Secondary | ICD-10-CM | POA: Diagnosis present

## 2023-07-18 DIAGNOSIS — D62 Acute posthemorrhagic anemia: Secondary | ICD-10-CM | POA: Diagnosis not present

## 2023-07-18 DIAGNOSIS — S72001A Fracture of unspecified part of neck of right femur, initial encounter for closed fracture: Secondary | ICD-10-CM | POA: Diagnosis present

## 2023-07-18 DIAGNOSIS — Z7984 Long term (current) use of oral hypoglycemic drugs: Secondary | ICD-10-CM

## 2023-07-18 DIAGNOSIS — S728X1A Other fracture of right femur, initial encounter for closed fracture: Secondary | ICD-10-CM | POA: Diagnosis not present

## 2023-07-18 DIAGNOSIS — S7221XA Displaced subtrochanteric fracture of right femur, initial encounter for closed fracture: Secondary | ICD-10-CM | POA: Diagnosis not present

## 2023-07-18 DIAGNOSIS — R001 Bradycardia, unspecified: Secondary | ICD-10-CM | POA: Diagnosis present

## 2023-07-18 DIAGNOSIS — W19XXXA Unspecified fall, initial encounter: Secondary | ICD-10-CM

## 2023-07-18 DIAGNOSIS — I10 Essential (primary) hypertension: Secondary | ICD-10-CM | POA: Diagnosis not present

## 2023-07-18 DIAGNOSIS — K219 Gastro-esophageal reflux disease without esophagitis: Secondary | ICD-10-CM | POA: Diagnosis present

## 2023-07-18 DIAGNOSIS — Z85828 Personal history of other malignant neoplasm of skin: Secondary | ICD-10-CM

## 2023-07-18 DIAGNOSIS — R739 Hyperglycemia, unspecified: Secondary | ICD-10-CM | POA: Diagnosis not present

## 2023-07-18 DIAGNOSIS — Y93E2 Activity, laundry: Secondary | ICD-10-CM

## 2023-07-18 DIAGNOSIS — M810 Age-related osteoporosis without current pathological fracture: Secondary | ICD-10-CM | POA: Diagnosis present

## 2023-07-18 DIAGNOSIS — N179 Acute kidney failure, unspecified: Secondary | ICD-10-CM | POA: Diagnosis present

## 2023-07-18 DIAGNOSIS — D696 Thrombocytopenia, unspecified: Secondary | ICD-10-CM | POA: Diagnosis present

## 2023-07-18 DIAGNOSIS — W010XXA Fall on same level from slipping, tripping and stumbling without subsequent striking against object, initial encounter: Secondary | ICD-10-CM | POA: Diagnosis not present

## 2023-07-18 DIAGNOSIS — Z803 Family history of malignant neoplasm of breast: Secondary | ICD-10-CM

## 2023-07-18 DIAGNOSIS — E785 Hyperlipidemia, unspecified: Secondary | ICD-10-CM | POA: Diagnosis present

## 2023-07-18 DIAGNOSIS — Y92008 Other place in unspecified non-institutional (private) residence as the place of occurrence of the external cause: Secondary | ICD-10-CM

## 2023-07-18 DIAGNOSIS — R0689 Other abnormalities of breathing: Secondary | ICD-10-CM | POA: Diagnosis not present

## 2023-07-18 DIAGNOSIS — E1165 Type 2 diabetes mellitus with hyperglycemia: Secondary | ICD-10-CM | POA: Diagnosis present

## 2023-07-18 DIAGNOSIS — R9431 Abnormal electrocardiogram [ECG] [EKG]: Secondary | ICD-10-CM | POA: Diagnosis not present

## 2023-07-18 DIAGNOSIS — Z7982 Long term (current) use of aspirin: Secondary | ICD-10-CM

## 2023-07-18 DIAGNOSIS — I110 Hypertensive heart disease with heart failure: Secondary | ICD-10-CM | POA: Diagnosis present

## 2023-07-18 DIAGNOSIS — S72031A Displaced midcervical fracture of right femur, initial encounter for closed fracture: Secondary | ICD-10-CM | POA: Diagnosis not present

## 2023-07-18 DIAGNOSIS — I5032 Chronic diastolic (congestive) heart failure: Secondary | ICD-10-CM | POA: Diagnosis present

## 2023-07-18 DIAGNOSIS — Z79899 Other long term (current) drug therapy: Secondary | ICD-10-CM

## 2023-07-18 NOTE — ED Triage Notes (Signed)
Pt with mechanical fall and rt hip pn. States she had been cleaning closets out all week and tripped over a box tonight. Denies hitting head or any LOC. RLE noted to be slightly abducted and shortened. Pt denies any other injuries.

## 2023-07-19 ENCOUNTER — Other Ambulatory Visit: Payer: Self-pay

## 2023-07-19 ENCOUNTER — Inpatient Hospital Stay: Payer: PPO | Admitting: Certified Registered"

## 2023-07-19 ENCOUNTER — Inpatient Hospital Stay: Payer: PPO

## 2023-07-19 ENCOUNTER — Encounter: Payer: Self-pay | Admitting: Internal Medicine

## 2023-07-19 ENCOUNTER — Encounter
Admission: EM | Disposition: A | Discharge status: Skilled Nursing Facility | Payer: Self-pay | Source: Home / Self Care | Attending: Osteopathic Medicine

## 2023-07-19 DIAGNOSIS — S72001A Fracture of unspecified part of neck of right femur, initial encounter for closed fracture: Secondary | ICD-10-CM | POA: Diagnosis present

## 2023-07-19 DIAGNOSIS — M6259 Muscle wasting and atrophy, not elsewhere classified, multiple sites: Secondary | ICD-10-CM | POA: Diagnosis not present

## 2023-07-19 DIAGNOSIS — R279 Unspecified lack of coordination: Secondary | ICD-10-CM | POA: Diagnosis not present

## 2023-07-19 DIAGNOSIS — Z741 Need for assistance with personal care: Secondary | ICD-10-CM | POA: Diagnosis not present

## 2023-07-19 DIAGNOSIS — N179 Acute kidney failure, unspecified: Secondary | ICD-10-CM

## 2023-07-19 DIAGNOSIS — Y92008 Other place in unspecified non-institutional (private) residence as the place of occurrence of the external cause: Secondary | ICD-10-CM | POA: Diagnosis not present

## 2023-07-19 DIAGNOSIS — R2681 Unsteadiness on feet: Secondary | ICD-10-CM | POA: Diagnosis not present

## 2023-07-19 DIAGNOSIS — M109 Gout, unspecified: Secondary | ICD-10-CM | POA: Diagnosis not present

## 2023-07-19 DIAGNOSIS — Z7982 Long term (current) use of aspirin: Secondary | ICD-10-CM | POA: Diagnosis not present

## 2023-07-19 DIAGNOSIS — M167 Other unilateral secondary osteoarthritis of hip: Secondary | ICD-10-CM | POA: Diagnosis not present

## 2023-07-19 DIAGNOSIS — S72009A Fracture of unspecified part of neck of unspecified femur, initial encounter for closed fracture: Secondary | ICD-10-CM | POA: Diagnosis not present

## 2023-07-19 DIAGNOSIS — M47816 Spondylosis without myelopathy or radiculopathy, lumbar region: Secondary | ICD-10-CM | POA: Diagnosis not present

## 2023-07-19 DIAGNOSIS — E785 Hyperlipidemia, unspecified: Secondary | ICD-10-CM | POA: Diagnosis not present

## 2023-07-19 DIAGNOSIS — K219 Gastro-esophageal reflux disease without esophagitis: Secondary | ICD-10-CM | POA: Diagnosis not present

## 2023-07-19 DIAGNOSIS — R54 Age-related physical debility: Secondary | ICD-10-CM | POA: Diagnosis not present

## 2023-07-19 DIAGNOSIS — E119 Type 2 diabetes mellitus without complications: Secondary | ICD-10-CM | POA: Diagnosis not present

## 2023-07-19 DIAGNOSIS — Z743 Need for continuous supervision: Secondary | ICD-10-CM | POA: Diagnosis not present

## 2023-07-19 DIAGNOSIS — S7224XD Nondisplaced subtrochanteric fracture of right femur, subsequent encounter for closed fracture with routine healing: Secondary | ICD-10-CM | POA: Diagnosis not present

## 2023-07-19 DIAGNOSIS — I517 Cardiomegaly: Secondary | ICD-10-CM | POA: Diagnosis not present

## 2023-07-19 DIAGNOSIS — E1165 Type 2 diabetes mellitus with hyperglycemia: Secondary | ICD-10-CM

## 2023-07-19 DIAGNOSIS — R001 Bradycardia, unspecified: Secondary | ICD-10-CM | POA: Diagnosis not present

## 2023-07-19 DIAGNOSIS — D62 Acute posthemorrhagic anemia: Secondary | ICD-10-CM | POA: Diagnosis not present

## 2023-07-19 DIAGNOSIS — S72031A Displaced midcervical fracture of right femur, initial encounter for closed fracture: Secondary | ICD-10-CM

## 2023-07-19 DIAGNOSIS — Z418 Encounter for other procedures for purposes other than remedying health state: Secondary | ICD-10-CM | POA: Diagnosis not present

## 2023-07-19 DIAGNOSIS — R0989 Other specified symptoms and signs involving the circulatory and respiratory systems: Secondary | ICD-10-CM | POA: Diagnosis not present

## 2023-07-19 DIAGNOSIS — D696 Thrombocytopenia, unspecified: Secondary | ICD-10-CM | POA: Diagnosis not present

## 2023-07-19 DIAGNOSIS — R52 Pain, unspecified: Secondary | ICD-10-CM | POA: Diagnosis not present

## 2023-07-19 DIAGNOSIS — S7221XA Displaced subtrochanteric fracture of right femur, initial encounter for closed fracture: Secondary | ICD-10-CM | POA: Diagnosis not present

## 2023-07-19 DIAGNOSIS — S728X1A Other fracture of right femur, initial encounter for closed fracture: Secondary | ICD-10-CM | POA: Diagnosis present

## 2023-07-19 DIAGNOSIS — E669 Obesity, unspecified: Secondary | ICD-10-CM | POA: Diagnosis not present

## 2023-07-19 DIAGNOSIS — G4700 Insomnia, unspecified: Secondary | ICD-10-CM | POA: Diagnosis not present

## 2023-07-19 DIAGNOSIS — Z85828 Personal history of other malignant neoplasm of skin: Secondary | ICD-10-CM | POA: Diagnosis not present

## 2023-07-19 DIAGNOSIS — Y93E2 Activity, laundry: Secondary | ICD-10-CM | POA: Diagnosis not present

## 2023-07-19 DIAGNOSIS — W010XXA Fall on same level from slipping, tripping and stumbling without subsequent striking against object, initial encounter: Secondary | ICD-10-CM

## 2023-07-19 DIAGNOSIS — E559 Vitamin D deficiency, unspecified: Secondary | ICD-10-CM | POA: Diagnosis not present

## 2023-07-19 DIAGNOSIS — K5900 Constipation, unspecified: Secondary | ICD-10-CM | POA: Diagnosis not present

## 2023-07-19 DIAGNOSIS — I110 Hypertensive heart disease with heart failure: Secondary | ICD-10-CM | POA: Diagnosis not present

## 2023-07-19 DIAGNOSIS — Z7984 Long term (current) use of oral hypoglycemic drugs: Secondary | ICD-10-CM | POA: Diagnosis not present

## 2023-07-19 DIAGNOSIS — Z803 Family history of malignant neoplasm of breast: Secondary | ICD-10-CM | POA: Diagnosis not present

## 2023-07-19 DIAGNOSIS — I10 Essential (primary) hypertension: Secondary | ICD-10-CM | POA: Diagnosis not present

## 2023-07-19 DIAGNOSIS — M810 Age-related osteoporosis without current pathological fracture: Secondary | ICD-10-CM | POA: Diagnosis not present

## 2023-07-19 DIAGNOSIS — Z79899 Other long term (current) drug therapy: Secondary | ICD-10-CM | POA: Diagnosis not present

## 2023-07-19 DIAGNOSIS — E569 Vitamin deficiency, unspecified: Secondary | ICD-10-CM | POA: Diagnosis not present

## 2023-07-19 DIAGNOSIS — Z01818 Encounter for other preprocedural examination: Secondary | ICD-10-CM | POA: Diagnosis not present

## 2023-07-19 DIAGNOSIS — J309 Allergic rhinitis, unspecified: Secondary | ICD-10-CM | POA: Diagnosis not present

## 2023-07-19 DIAGNOSIS — I5032 Chronic diastolic (congestive) heart failure: Secondary | ICD-10-CM | POA: Diagnosis not present

## 2023-07-19 HISTORY — PX: INTRAMEDULLARY (IM) NAIL INTERTROCHANTERIC: SHX5875

## 2023-07-19 LAB — GLUCOSE, CAPILLARY
Glucose-Capillary: 171 mg/dL — ABNORMAL HIGH (ref 70–99)
Glucose-Capillary: 153 mg/dL — ABNORMAL HIGH (ref 70–99)
Glucose-Capillary: 150 mg/dL — ABNORMAL HIGH (ref 70–99)
Glucose-Capillary: 142 mg/dL — ABNORMAL HIGH (ref 70–99)

## 2023-07-19 LAB — BASIC METABOLIC PANEL
Anion gap: 7 (ref 5–15)
BUN: 29 mg/dL — ABNORMAL HIGH (ref 8–23)
CO2: 27 mmol/L (ref 22–32)
Calcium: 9.1 mg/dL (ref 8.9–10.3)
Chloride: 99 mmol/L (ref 98–111)
Creatinine, Ser: 1.11 mg/dL — ABNORMAL HIGH (ref 0.44–1.00)
GFR, Estimated: 48 mL/min — ABNORMAL LOW (ref >60)
Glucose, Bld: 220 mg/dL — ABNORMAL HIGH (ref 70–99)
Potassium: 3.9 mmol/L (ref 3.5–5.1)
Sodium: 133 mmol/L — ABNORMAL LOW (ref 135–145)

## 2023-07-19 LAB — CBC WITH DIFFERENTIAL/PLATELET
Abs Immature Granulocytes: 0.03 10*3/uL (ref 0.00–0.07)
Basophils Absolute: 0 10*3/uL (ref 0.0–0.1)
Basophils Relative: 0 %
Eosinophils Absolute: 0.1 10*3/uL (ref 0.0–0.5)
Eosinophils Relative: 2 %
HCT: 30.9 % — ABNORMAL LOW (ref 36.0–46.0)
Hemoglobin: 10.1 g/dL — ABNORMAL LOW (ref 12.0–15.0)
Immature Granulocytes: 0 %
Lymphocytes Relative: 34 %
Lymphs Abs: 2.5 10*3/uL (ref 0.7–4.0)
MCH: 28.8 pg (ref 26.0–34.0)
MCHC: 32.7 g/dL (ref 30.0–36.0)
MCV: 88 fL (ref 80.0–100.0)
Monocytes Absolute: 0.6 10*3/uL (ref 0.1–1.0)
Monocytes Relative: 8 %
Neutro Abs: 4.1 10*3/uL (ref 1.7–7.7)
Neutrophils Relative %: 56 %
Platelets: 195 10*3/uL (ref 150–400)
RBC: 3.51 MIL/uL — ABNORMAL LOW (ref 3.87–5.11)
RDW: 15.9 % — ABNORMAL HIGH (ref 11.5–15.5)
WBC: 7.2 10*3/uL (ref 4.0–10.5)
nRBC: 0 % (ref 0.0–0.2)

## 2023-07-19 LAB — TYPE AND SCREEN
ABO/RH(D): O NEG
Antibody Screen: NEGATIVE

## 2023-07-19 SURGERY — FIXATION, FRACTURE, INTERTROCHANTERIC, WITH INTRAMEDULLARY ROD
Anesthesia: Spinal | Site: Hip | Laterality: Right

## 2023-07-19 MED ORDER — MORPHINE SULFATE (PF) 2 MG/ML IV SOLN
2.0000 mg | Freq: Once | INTRAVENOUS | Status: AC
  Administered 2023-07-19: 2 mg via INTRAVENOUS
  Filled 2023-07-19: qty 1

## 2023-07-19 MED ORDER — INSULIN ASPART 100 UNIT/ML IJ SOLN
0.0000 [IU] | INTRAMUSCULAR | Status: DC
  Administered 2023-07-19: 2 [IU] via SUBCUTANEOUS
  Administered 2023-07-19: 1 [IU] via SUBCUTANEOUS
  Administered 2023-07-20 (×2): 2 [IU] via SUBCUTANEOUS
  Administered 2023-07-20: 1 [IU] via SUBCUTANEOUS
  Administered 2023-07-20: 2 [IU] via SUBCUTANEOUS
  Filled 2023-07-19 (×6): qty 1

## 2023-07-19 MED ORDER — HYDROCODONE-ACETAMINOPHEN 5-325 MG PO TABS: 1.0000 | ORAL_TABLET | Freq: Four times a day (QID) | ORAL | Status: DC | PRN

## 2023-07-19 MED ORDER — TRANEXAMIC ACID-NACL 1000-0.7 MG/100ML-% IV SOLN
INTRAVENOUS | Status: AC
  Filled 2023-07-19: qty 100

## 2023-07-19 MED ORDER — TRANEXAMIC ACID-NACL 1000-0.7 MG/100ML-% IV SOLN
INTRAVENOUS | Status: DC | PRN
  Administered 2023-07-19: 1000 mg via INTRAVENOUS

## 2023-07-19 MED ORDER — METHOCARBAMOL 1000 MG/10ML IJ SOLN
500.0000 mg | Freq: Four times a day (QID) | INTRAMUSCULAR | Status: DC | PRN
  Administered 2023-07-19: 500 mg via INTRAVENOUS
  Filled 2023-07-19 (×2): qty 5

## 2023-07-19 MED ORDER — HYDROMORPHONE HCL 1 MG/ML IJ SOLN
0.5000 mg | INTRAMUSCULAR | Status: DC | PRN
  Administered 2023-07-19: 0.5 mg via INTRAVENOUS
  Filled 2023-07-19: qty 1

## 2023-07-19 MED ORDER — MORPHINE SULFATE (PF) 2 MG/ML IV SOLN
1.0000 mg | Freq: Once | INTRAVENOUS | Status: AC
  Administered 2023-07-19: 1 mg via INTRAVENOUS

## 2023-07-19 MED ORDER — DOCUSATE SODIUM 100 MG PO CAPS
100.0000 mg | ORAL_CAPSULE | Freq: Two times a day (BID) | ORAL | Status: DC
Start: 2023-07-19 — End: 2023-07-20
  Administered 2023-07-19 – 2023-07-20 (×2): 100 mg via ORAL
  Filled 2023-07-19 (×2): qty 1

## 2023-07-19 MED ORDER — KETAMINE HCL 50 MG/5ML IJ SOSY
PREFILLED_SYRINGE | INTRAMUSCULAR | Status: AC
  Filled 2023-07-19: qty 5

## 2023-07-19 MED ORDER — ENOXAPARIN SODIUM 40 MG/0.4ML IJ SOSY
40.0000 mg | PREFILLED_SYRINGE | INTRAMUSCULAR | Status: DC
  Administered 2023-07-20 – 2023-07-21 (×2): 40 mg via SUBCUTANEOUS
  Filled 2023-07-19 (×2): qty 0.4

## 2023-07-19 MED ORDER — MIDAZOLAM HCL 2 MG/2ML IJ SOLN
INTRAMUSCULAR | Status: AC
Start: 2023-07-19 — End: ?
  Filled 2023-07-19: qty 2

## 2023-07-19 MED ORDER — PROPOFOL 10 MG/ML IV BOLUS
INTRAVENOUS | Status: DC | PRN
  Administered 2023-07-19: 20 mg via INTRAVENOUS

## 2023-07-19 MED ORDER — STERILE WATER FOR IRRIGATION IR SOLN
Status: DC | PRN
  Administered 2023-07-19: 1000 mL

## 2023-07-19 MED ORDER — CEFAZOLIN SODIUM-DEXTROSE 2-4 GM/100ML-% IV SOLN
2.0000 g | INTRAVENOUS | Status: AC
  Administered 2023-07-19: 2 g via INTRAVENOUS
  Filled 2023-07-19: qty 100

## 2023-07-19 MED ORDER — ADULT MULTIVITAMIN W/MINERALS CH
1.0000 | ORAL_TABLET | Freq: Every day | ORAL | Status: DC
  Administered 2023-07-20 – 2023-07-26 (×7): 1 via ORAL
  Filled 2023-07-19 (×7): qty 1

## 2023-07-19 MED ORDER — SODIUM CHLORIDE 0.9 % IV SOLN: INTRAVENOUS | Status: DC | PRN

## 2023-07-19 MED ORDER — MIDAZOLAM HCL 5 MG/5ML IJ SOLN
INTRAMUSCULAR | Status: DC | PRN
  Administered 2023-07-19 (×2): 1 mg via INTRAVENOUS

## 2023-07-19 MED ORDER — ACETAMINOPHEN 10 MG/ML IV SOLN
INTRAVENOUS | Status: DC | PRN
  Administered 2023-07-19: 1000 mg via INTRAVENOUS

## 2023-07-19 MED ORDER — PROPOFOL 10 MG/ML IV BOLUS
INTRAVENOUS | Status: AC
  Filled 2023-07-19: qty 20

## 2023-07-19 MED ORDER — ONDANSETRON HCL 4 MG/2ML IJ SOLN
4.0000 mg | Freq: Four times a day (QID) | INTRAMUSCULAR | Status: DC | PRN
  Administered 2023-07-20 – 2023-07-23 (×2): 4 mg via INTRAVENOUS
  Filled 2023-07-19 (×2): qty 2

## 2023-07-19 MED ORDER — 0.9 % SODIUM CHLORIDE (POUR BTL) OPTIME
TOPICAL | Status: DC | PRN
  Administered 2023-07-19: 500 mL

## 2023-07-19 MED ORDER — CEFAZOLIN SODIUM-DEXTROSE 2-4 GM/100ML-% IV SOLN
INTRAVENOUS | Status: AC
  Filled 2023-07-19: qty 100

## 2023-07-19 MED ORDER — ONDANSETRON HCL 4 MG PO TABS: 4.0000 mg | ORAL_TABLET | Freq: Four times a day (QID) | ORAL | Status: DC | PRN

## 2023-07-19 MED ORDER — PHENYLEPHRINE 80 MCG/ML (10ML) SYRINGE FOR IV PUSH (FOR BLOOD PRESSURE SUPPORT)
PREFILLED_SYRINGE | INTRAVENOUS | Status: DC | PRN
  Administered 2023-07-19 (×7): 80 ug via INTRAVENOUS

## 2023-07-19 MED ORDER — GLUCERNA SHAKE PO LIQD: 237.0000 mL | Freq: Three times a day (TID) | ORAL | Status: DC

## 2023-07-19 MED ORDER — METOCLOPRAMIDE HCL 5 MG/ML IJ SOLN: 5.0000 mg | Freq: Three times a day (TID) | INTRAMUSCULAR | Status: DC | PRN

## 2023-07-19 MED ORDER — CEFAZOLIN (ANCEF) 1 G IV SOLR: 2.0000 g | INTRAVENOUS | Status: DC

## 2023-07-19 MED ORDER — EPHEDRINE 5 MG/ML INJ
INTRAVENOUS | Status: AC
  Filled 2023-07-19: qty 5

## 2023-07-19 MED ORDER — TRAMADOL HCL 50 MG PO TABS
100.0000 mg | ORAL_TABLET | Freq: Two times a day (BID) | ORAL | Status: DC
  Administered 2023-07-19 – 2023-07-26 (×13): 100 mg via ORAL
  Filled 2023-07-19 (×14): qty 2

## 2023-07-19 MED ORDER — ACETAMINOPHEN 10 MG/ML IV SOLN
INTRAVENOUS | Status: AC
  Filled 2023-07-19: qty 100

## 2023-07-19 MED ORDER — LIDOCAINE HCL (PF) 2 % IJ SOLN
INTRAMUSCULAR | Status: DC | PRN
  Administered 2023-07-19: 10 mg

## 2023-07-19 MED ORDER — CEFAZOLIN SODIUM-DEXTROSE 2-4 GM/100ML-% IV SOLN
2.0000 g | Freq: Once | INTRAVENOUS | Status: AC
  Administered 2023-07-19: 2 g via INTRAVENOUS

## 2023-07-19 MED ORDER — EPINEPHRINE PF 1 MG/ML IJ SOLN
INTRAMUSCULAR | Status: AC
  Filled 2023-07-19: qty 1

## 2023-07-19 MED ORDER — MENTHOL 3 MG MT LOZG
1.0000 | LOZENGE | OROMUCOSAL | Status: DC | PRN
Start: 2023-07-19 — End: 2023-07-26

## 2023-07-19 MED ORDER — METHOCARBAMOL 500 MG PO TABS
500.0000 mg | ORAL_TABLET | Freq: Four times a day (QID) | ORAL | Status: DC | PRN
  Administered 2023-07-23 – 2023-07-25 (×3): 500 mg via ORAL
  Filled 2023-07-19 (×3): qty 1

## 2023-07-19 MED ORDER — METOCLOPRAMIDE HCL 5 MG PO TABS: 5.0000 mg | ORAL_TABLET | Freq: Three times a day (TID) | ORAL | Status: DC | PRN

## 2023-07-19 MED ORDER — PROPOFOL 500 MG/50ML IV EMUL
INTRAVENOUS | Status: DC | PRN
  Administered 2023-07-19: 25 ug/kg/min via INTRAVENOUS

## 2023-07-19 MED ORDER — BUPIVACAINE HCL (PF) 0.5 % IJ SOLN
INTRAMUSCULAR | Status: DC | PRN
  Administered 2023-07-19: 2.4 mL via INTRATHECAL

## 2023-07-19 MED ORDER — BUPIVACAINE-EPINEPHRINE (PF) 0.5% -1:200000 IJ SOLN
INTRAMUSCULAR | Status: DC | PRN
  Administered 2023-07-19: 30 mL via PERINEURAL

## 2023-07-19 MED ORDER — HYDROCODONE-ACETAMINOPHEN 5-325 MG PO TABS
1.0000 | ORAL_TABLET | ORAL | Status: DC | PRN
  Administered 2023-07-20 – 2023-07-21 (×6): 1 via ORAL
  Filled 2023-07-19 (×6): qty 1

## 2023-07-19 MED ORDER — KETAMINE HCL 10 MG/ML IJ SOLN: INTRAMUSCULAR | Status: DC | PRN

## 2023-07-19 MED ORDER — KETAMINE HCL 50 MG/5ML IJ SOSY
PREFILLED_SYRINGE | INTRAMUSCULAR | Status: DC | PRN
  Administered 2023-07-19: 10 mg via INTRAVENOUS
  Administered 2023-07-19: 15 mg via INTRAVENOUS
  Administered 2023-07-19: 25 mg via INTRAVENOUS

## 2023-07-19 MED ORDER — PHENOL 1.4 % MT LIQD: 1.0000 | OROMUCOSAL | Status: DC | PRN

## 2023-07-19 MED ORDER — EPHEDRINE SULFATE-NACL 50-0.9 MG/10ML-% IV SOSY
PREFILLED_SYRINGE | INTRAVENOUS | Status: DC | PRN
  Administered 2023-07-19: 10 mg via INTRAVENOUS
  Administered 2023-07-19 (×3): 5 mg via INTRAVENOUS

## 2023-07-19 MED ORDER — ACETAMINOPHEN 325 MG PO TABS: 325.0000 mg | ORAL_TABLET | Freq: Four times a day (QID) | ORAL | Status: DC | PRN

## 2023-07-19 MED ORDER — MORPHINE SULFATE (PF) 2 MG/ML IV SOLN
0.5000 mg | INTRAVENOUS | Status: DC | PRN
  Administered 2023-07-19: 0.5 mg via INTRAVENOUS
  Filled 2023-07-19 (×2): qty 1

## 2023-07-19 MED ORDER — ADULT MULTIVITAMIN W/MINERALS CH: 1.0000 | ORAL_TABLET | Freq: Every day | ORAL | Status: DC

## 2023-07-19 MED ORDER — BUPIVACAINE HCL (PF) 0.5 % IJ SOLN
INTRAMUSCULAR | Status: AC
  Filled 2023-07-19: qty 10

## 2023-07-19 MED ORDER — BUPIVACAINE HCL (PF) 0.5 % IJ SOLN
INTRAMUSCULAR | Status: AC
  Filled 2023-07-19: qty 30

## 2023-07-19 MED ORDER — GLUCERNA SHAKE PO LIQD
237.0000 mL | Freq: Three times a day (TID) | ORAL | Status: DC
  Administered 2023-07-20 – 2023-07-26 (×12): 237 mL via ORAL

## 2023-07-19 SURGICAL SUPPLY — 39 items
APL PRP STRL LF DISP 70% ISPRP (MISCELLANEOUS) ×2
BIT DRILL CALIBRATED 4.2 (BIT) IMPLANT
BIT DRILL CANN 16 HIP (BIT) IMPLANT
BIT DRILL CANN STP 6/9 HIP (BIT) IMPLANT
BIT DRILL TAPERED 10 (BIT) IMPLANT
BNDG CMPR 5X4 CHSV STRCH STRL (GAUZE/BANDAGES/DRESSINGS) ×3
BNDG COHESIVE 4X5 TAN STRL LF (GAUZE/BANDAGES/DRESSINGS) IMPLANT
CHLORAPREP W/TINT 26 (MISCELLANEOUS) IMPLANT
DRAPE C-ARM XRAY 36X54 (DRAPES) IMPLANT
DRAPE SHEET LG 3/4 BI-LAMINATE (DRAPES) IMPLANT
DRAPE U-SHAPE 47X51 STRL (DRAPES) IMPLANT
DRILL BIT CALIBRATED 4.2 (BIT) ×1
ELECT REM PT RETURN 9FT ADLT (ELECTROSURGICAL) ×1
ELECTRODE REM PT RTRN 9FT ADLT (ELECTROSURGICAL) IMPLANT
GAUZE SPONGE 4X4 12PLY STRL (GAUZE/BANDAGES/DRESSINGS) IMPLANT
GAUZE XEROFORM 5X9 LF (GAUZE/BANDAGES/DRESSINGS) IMPLANT
GLOVE SURG SYN 7.5 E (GLOVE) ×5 IMPLANT
GLOVE SURG SYN 7.5 PF PI (GLOVE) IMPLANT
GOWN STRL REUS W/ TWL LRG LVL3 (GOWN DISPOSABLE) IMPLANT
GOWN STRL REUS W/TWL LRG LVL3 (GOWN DISPOSABLE) ×2
GUIDEWIRE 3.2X400 (WIRE) IMPLANT
KIT TURNOVER KIT A (KITS) IMPLANT
MANIFOLD NEPTUNE II (INSTRUMENTS) IMPLANT
MAT ABSORB FLUID 56X50 GRAY (MISCELLANEOUS) IMPLANT
NAIL TROCH FIX LNG 11X380RT (Nail) IMPLANT
NDL HYPO 21X1.5 SAFETY (NEEDLE) IMPLANT
NEEDLE HYPO 21X1.5 SAFETY (NEEDLE) ×1 IMPLANT
PACK HIP COMPR (MISCELLANEOUS) IMPLANT
PAD ABD DERMACEA PRESS 5X9 (GAUZE/BANDAGES/DRESSINGS) IMPLANT
PADDING CAST BLEND 4X4 NS (MISCELLANEOUS) IMPLANT
REAMER ROD DEEP FLUTE 2.5X950 (INSTRUMENTS) IMPLANT
SCREW LAG TFNA 90 HIP (Screw) IMPLANT
SCREW LOCK STAR 5X48 (Screw) IMPLANT
SCRUB CHG 4% DYNA-HEX 4OZ (MISCELLANEOUS) IMPLANT
STAPLER SKIN PROX 35W (STAPLE) IMPLANT
SUCTION TUBE FRAZIER 10FR DISP (SUCTIONS) IMPLANT
SUT VIC AB 0 CT1 36 (SUTURE) IMPLANT
SUT VIC AB 2-0 CT1 (SUTURE) IMPLANT
TRAP FLUID SMOKE EVACUATOR (MISCELLANEOUS) IMPLANT

## 2023-07-19 NOTE — Transfer of Care (Signed)
Immediate Anesthesia Transfer of Care Note  Patient: Tracy Spears  Procedure(s) Performed: INTRAMEDULLARY (IM) NAIL INTERTROCHANTERIC (Right: Hip)  Patient Location: PACU  Anesthesia Type:Spinal  Level of Consciousness: drowsy  Airway & Oxygen Therapy: Patient Spontanous Breathing  Post-op Assessment: Report given to RN and Post -op Vital signs reviewed and stable  Post vital signs: Reviewed  Last Vitals:  Vitals Value Taken Time  BP 106/62   Temp    Pulse 57 07/19/23 1720  Resp 12 07/19/23 1720  SpO2 98 % 07/19/23 1720  Vitals shown include unfiled device data.  Last Pain:      Patients Stated Pain Goal: 0 (07/19/23 1310)  Complications: No notable events documented.

## 2023-07-19 NOTE — Discharge Instructions (Signed)
ORTHOPEDIC DISCHARGE INSTRUCTIONS  Follow Up Appointment:  Follow-up in the office in 10-14 days.  Please contact the [facility] at [phone]  to schedule your follow-up appointment.  Dressing and cast care instructions:  After 48 hours, remove the large dressing and clean the incision with hydrogen peroxide.  Begin daily light, dry dressing changes.  Weight-bearing status:  You are partial weightbearing on the right lower extremity with your walker.   Diet:   You may resume your regular diet as tolerated.  Begin with clear liquids and slowly advance to your normal diet.  Medications:   Take your medicines as prescribed. You may have written prescriptions or your prescriptions may have been E-prescribed to your pharmacy. If you were given prescriptions for any blood thinners, it is a very important that you take these medications as prescribed to prevent blood clots.  General instructions:  Elevate the affected extremity to control pain and swelling. Apply ice packs to the affected area to control pain and swelling.  Surgery and pain medications may lead to constipation. It is easier to prevent this than it is to treat it. We recommend MiraLAX or Senna/Senakot for laxatives and Colace as a stool softener as needed after surgery. Drink plenty of fluids to stay well-hydrated. Consult your local pharmacist for other treatment recommendations.   Please perform deep breathing exercises every hour to increase the airflow in your lungs which could predispose you to running a low-grade fever and increase your risk of pneumonia.

## 2023-07-19 NOTE — ED Notes (Signed)
Pt denies need for pn medication at this time. Encouraged to notify this Clinical research associate prior to pn worsening. Pt verbalizes understanding.

## 2023-07-19 NOTE — Progress Notes (Signed)
[facility] PROGRESS NOTE  Tracy Spears (DOB: 20-Oct-1932) ZOX:[id number] PCP: Lauro Regulus, MD  Brief Narrative: Tracy Spears is an active 89 y.o. female with a history of T2DM, HTN, hyponatremia, gout, nephrolithiasis who presented to the ED on 07/18/2023 after tripping on laundry at home, falling on right hip with significant pain. Confirmed to have subtrochanteric right femur fracture. Orthopedics is planning operative fixation 11/11.   Subjective: Feels fine, lots of pain in the right hip with transfer to bed this morning. Denies current or recent chest pain, orthopnea, PND, DOE, leg swelling, palpitations. No bleeding or other complaints.   Objective: BP (!) 152/60 (BP Location: Right Arm)   Pulse (!) 58   Temp 98.1 F (36.7 C)   Resp 17   Ht 5' (1.524 m)   Wt 50.8 kg   SpO2 90%   BMI 21.87 kg/m   Gen: Elderly pale, pleasant female in no distress Pulm: Clear, nonlabored  CV: RRR with premature beat, no MRG or JVD GI: Soft, NT, ND, +BS Neuro: Alert and oriented. No new focal deficits. Ext: Warm, dry. RLE shortened, tender to palpation proximally. Foot is warm with palpable pulses, normal sensation and motor function. Skin: No new rashes, lesions or ulcers on visualized skin   Assessment & Plan: Closed right subtrochanteric femur fracture from mechanical fall at home:  - Her premorbid functional status is good and there is no current exacerbation of any of her chronic medical conditions. Her primary risk factor for perioperative morbidity/mortality is her advanced age. I don't believe any further work up is needed to proceed to surgery. D/w Dr. Rosann Auerbach, who plans on surgery today. Keeping pt NPO.  - Morphine inadequate, will change to dilaudid though still low dose. CrCl 82ml/min.  - Postoperative weight bearing, PT/OT and VTE ppx orders will be based on orthopedics recommendations.   HTN, chronic HFpEF: Appears euvolemic. CXR with cardiomegaly (AP film),  pulmonary vascular congestion though she has no dyspnea, hypoxia, or crackles on exam. She has no respiratory symptoms and is without dyspnea completely supine. Last echo May 2024 showed LVEF 60-65%, no RWMA, grade 2 diastolic dysfunction. Normal RV and no hemodynamically significant valvulopathy. Severe LAE. - Continue home losartan, carvedilol. Hold torsemide with mild AKI and NPO status.  - If respiratory status worsens, may need to diurese.  AKI: Cr 1.11 from baseline of 0.7. - Hold torsemide, given some IVF, will plan to avoid nephrotoxins and recheck in AM.  - Hold gabapentin and metformin for right now  T2DM: HbA1c 7.1% indicating adequate control for this patient.  - Continue SSI  HLD:  - Restart statin at discharge  Gout: Quiescent.  Tyrone Nine, MD [facility] [email or web address] 07/19/2023, 10:24 AM

## 2023-07-19 NOTE — H&P (Signed)
History and Physical    Patient: Tracy Spears NWG:[id number] DOB: March 04, 1933 DOA: 07/18/2023 DOS: the patient was seen and examined on 07/19/2023 PCP: Lauro Regulus, MD  Patient coming from: Home  Chief Complaint:  Chief Complaint  Patient presents with   Hip Pain    Pt to ER via EMS with c/o rt hip pain s/p mechanical fall tripping over box. Denies any other injuries or any LOC. Takes only low dose ASA for anticoag.    HPI: Tracy Spears is a 89 y.o. female with medical history significant for Diabetes, hypertension hyponatremia, gout, nephrolithiasis, independent in ADLs, being admitted with a right proximal femur fracture from an accidental fall when she tripped over something on the floor while doing her laundry.  She was previously in her usual state of health and denied preceding lightheadedness, blurred vision, headache, chest pain, shortness of breath or palpitations.  She denied hitting her head or losing consciousness. ED course and data review: Mild bradycardia to 58 with otherwise normal vitals Labs: CBC and BMP notable for baseline anemia of 10.1, creatinine of 1.11 above baseline of 0.7.  Blood glucose 220 and sodium 133. EKG, personally reviewed and interpreted showing sinus rhythm at 58 with no acute ST-T wave changes. Chest x-ray showed cardiomegaly with pulmonary vascular congestion similar to prior from May 2024 Hip x-ray showed Acute displaced angulated fracture of the proximal right femur. Patient was treated with fentanyl with EMS and received morphine in the ED with adequate pain control. The ED provider spoke with orthopedist, Dr. Rosann Auerbach.  No set time for surgery as yet. Hospitalist consulted for admission.   Review of Systems: As mentioned in the history of present illness. All other systems reviewed and are negative.  Past Medical History:  Diagnosis Date   Actinic keratosis    Basal cell carcinoma 04/07/2010   Right paranasal.    Diabetes  mellitus without complication (HCC)    History reviewed. No pertinent surgical history. Social History:  reports that she has never smoked. She has never used smokeless tobacco. She reports that she does not drink alcohol and does not use drugs.  Allergies  Allergen Reactions   Lisinopril Other (See Comments)    cough    Family History  Problem Relation Age of Onset   Breast cancer Other     Prior to Admission medications   Medication Sig Start Date End Date Taking? Authorizing Provider  aspirin 81 MG chewable tablet Chew 81 mg by mouth daily.   Yes [provider]  atorvastatin (LIPITOR) 10 MG tablet Take 1 tablet by mouth daily. 12/22/19  Yes [provider]  azelastine (ASTELIN) 0.1 % nasal spray Place 2 sprays into both nostrils daily as needed for allergies or rhinitis. 10/28/20  Yes [provider]  carvedilol (COREG) 25 MG tablet Take 25 mg by mouth 2 (two) times daily. 02/19/20  Yes [provider]  Cholecalciferol 50 MCG (2000 UT) CAPS Take 1 capsule by mouth daily.   Yes [provider]  colchicine 0.6 MG tablet Take 0.6 mg by mouth daily as needed. 02/19/20  Yes [provider]  diclofenac Sodium (VOLTAREN) 1 % GEL Apply 2 g topically 3 (three) times daily as needed. 12/13/20  Yes Lucy Chris, PA  ferrous sulfate 325 (65 FE) MG tablet Take 325 mg by mouth daily with breakfast.   Yes [provider]  gabapentin (NEURONTIN) 300 MG capsule Take 1 capsule by mouth 3 (three) times daily. 02/06/20  Yes [provider]  losartan (COZAAR) 100 MG tablet Take 1 tablet (100 mg total) by mouth daily. 01/24/23  Yes Amin, Ankit C, MD  metFORMIN (GLUCOPHAGE-XR) 500 MG 24 hr tablet Take 1,500 mg by mouth at bedtime. 03/22/20  Yes [provider]  Multiple Vitamin (MULTI-VITAMIN) tablet Take 1 tablet by mouth daily.   Yes [provider]  pantoprazole (PROTONIX) 40 MG tablet Take 1 tablet by mouth daily.  04/02/20  Yes [provider]  potassium chloride (KLOR-CON) 10 MEQ tablet Take 10 mEq by mouth daily. 06/28/23  Yes [provider]  torsemide (DEMADEX) 20 MG tablet Take 20 mg by mouth once. 05/03/23 05/02/24 Yes [provider]  traZODone (DESYREL) 50 MG tablet Take 1 tablet by mouth at bedtime as needed. 05/03/23 05/02/24 Yes [provider]  triamcinolone cream (KENALOG) 0.1 % Apply to itchy rash on body 1-2 times daily until improved. Avoid face, groin, underarms. Patient not taking: Reported on 07/19/2023 06/02/22   Willeen Niece, MD    Physical Exam: Vitals:   07/18/23 2354 07/19/23 0000 07/19/23 0100 07/19/23 0108  BP:   (!) 148/72   Pulse:  (!) 58 60   Resp:  18 (!) 23   Temp:    98 F (36.7 C)  TempSrc:    Oral  SpO2:  98% 95%   Weight: 50.8 kg     Height: 5' (1.524 m)      Physical Exam Vitals and nursing note reviewed.  Constitutional:      General: She is not in acute distress.    Comments: Frail-appearing elderly female, awake and alert, in no distress  HENT:     Head: Normocephalic and atraumatic.  Cardiovascular:     Rate and Rhythm: Normal rate and regular rhythm.     Heart sounds: Normal heart sounds.  Pulmonary:     Effort: Pulmonary effort is normal.     Breath sounds: Normal breath sounds.  Abdominal:     Palpations: Abdomen is soft.     Tenderness: There is no abdominal tenderness.  Neurological:     Mental Status: Mental status is at baseline.     Labs on Admission: I have personally reviewed following labs and imaging studies  CBC: Recent Labs  Lab 07/19/23 0132  WBC 7.2  NEUTROABS 4.1  HGB 10.1*  HCT 30.9*  MCV 88.0  PLT 195   Basic Metabolic Panel: Recent Labs  Lab 07/19/23 0132  NA 133*  K 3.9  CL 99  CO2 27  GLUCOSE 220*  BUN 29*  CREATININE 1.11*  CALCIUM 9.1   GFR: Estimated Creatinine Clearance: 25.2 mL/min (A) (by C-G formula based on SCr of 1.11 mg/dL (H)). Liver Function Tests: No  results for input(s): "AST", "ALT", "ALKPHOS", "BILITOT", "PROT", "ALBUMIN" in the last 168 hours. No results for input(s): "LIPASE", "AMYLASE" in the last 168 hours. No results for input(s): "AMMONIA" in the last 168 hours. Coagulation Profile: No results for input(s): "INR", "PROTIME" in the last 168 hours. Cardiac Enzymes: No results for input(s): "CKTOTAL", "CKMB", "CKMBINDEX", "TROPONINI" in the last 168 hours. BNP (last 3 results) No results for input(s): "PROBNP" in the last 8760 hours. HbA1C: No results for input(s): "HGBA1C" in the last 72 hours. CBG: No results for input(s): "GLUCAP" in the last 168 hours. Lipid Profile: No results for input(s): "CHOL", "HDL", "LDLCALC", "TRIG", "CHOLHDL", "LDLDIRECT" in the last 72 hours. Thyroid Function Tests: No results for input(s): "TSH", "T4TOTAL", "FREET4", "T3FREE", "THYROIDAB" in the  last 72 hours. Anemia Panel: No results for input(s): "VITAMINB12", "FOLATE", "FERRITIN", "TIBC", "IRON", "RETICCTPCT" in the last 72 hours. Urine analysis: No results found for: "COLORURINE", "APPEARANCEUR", "LABSPEC", "PHURINE", "GLUCOSEU", "HGBUR", "BILIRUBINUR", "KETONESUR", "PROTEINUR", "UROBILINOGEN", "NITRITE", "LEUKOCYTESUR"  Radiological Exams on Admission: DG Chest Portable 1 View  Result Date: 07/19/2023 CLINICAL DATA:  Preop for hip fracture. EXAM: PORTABLE CHEST 1 VIEW COMPARISON:  01/20/2023 FINDINGS: Stable cardiomegaly. Aortic atherosclerotic calcification. Pulmonary vascular congestion. No focal consolidation, pleural effusion, or pneumothorax. No displaced rib fractures. IMPRESSION: Cardiomegaly with pulmonary vascular congestion, similar to prior. Electronically Signed   By: Minerva Fester M.D.   On: 07/19/2023 00:46   DG Hip Unilat W or Wo Pelvis 2-3 Views Right  Result Date: 07/19/2023 CLINICAL DATA:  Fall with right hip pain. EXAM: DG HIP (WITH OR WITHOUT PELVIS) 2-3V RIGHT COMPARISON:  12/13/2020 FINDINGS: Acute displaced  fracture of the proximal right femur. The fracture line courses obliquely from the lesser trochanter inferolaterally into the femoral diaphysis. There is apex lateral angulation. No dislocation of the femoral head. Degenerative changes pubic symphysis, both hips, SI joints and lower lumbar spine. Demineralization. IMPRESSION: Acute displaced angulated fracture of the proximal right femur. Electronically Signed   By: Minerva Fester M.D.   On: 07/19/2023 00:45     Data Reviewed: Relevant notes from primary care and specialist visits, past discharge summaries as available in EHR, including Care Everywhere. Prior diagnostic testing as pertinent to current admission diagnoses Updated medications and problem lists for reconciliation ED course, including vitals, labs, imaging, treatment and response to treatment Triage notes, nursing and pharmacy notes and ED provider's notes Notable results as noted in HPI   Assessment and Plan: Closed  fracture of proximal femur, right, initial encounter (HCC) N.p.o. Pain control Ortho aware and formally consulted Low risk of perioperative cardiopulmonary disease.  Patient with chronic stable comorbidities and good functional status Echocardiogram 01/2023 : normal left ventricular function with LVEF 60 to 65%   Uncontrolled type 2 diabetes mellitus with hyperglycemia, without long-term current use of insulin (HCC) Blood glucose 220.  Sliding scale insulin coverage  AKI (acute kidney injury) (HCC) Creatinine 1.11 up from baseline of 0.71 Suspect ATN secondary to diuretics-patient on torsemide 20 mg daily Will give a small IV fluid bolus is currently n.p.o.  Essential hypertension Continue losartan and carvedilol    DVT prophylaxis: SCD  Consults: ortho  Advance Care Planning:   Code Status: Prior   Family Communication: none  Disposition Plan: Back to previous home environment  Severity of Illness: The appropriate patient status for this patient  is INPATIENT. Inpatient status is judged to be reasonable and necessary in order to provide the required intensity of service to ensure the patient's safety. The patient's presenting symptoms, physical exam findings, and initial radiographic and laboratory data in the context of their chronic comorbidities is felt to place them at high risk for further clinical deterioration. Furthermore, it is not anticipated that the patient will be medically stable for discharge from the hospital within 2 midnights of admission.   * I certify that at the point of admission it is my clinical judgment that the patient will require inpatient hospital care spanning beyond 2 midnights from the point of admission due to high intensity of service, high risk for further deterioration and high frequency of surveillance required.*  Author: Andris Baumann, MD 07/19/2023 2:41 AM  For on call review [email or web address].

## 2023-07-19 NOTE — Progress Notes (Signed)
Nutrition Follow-up  DOCUMENTATION CODES:   Not applicable  INTERVENTION:   -Once diet is advanced, add:   -Glucerna Shake po TID, each supplement provides 220 kcal and 10 grams of protein  -MVI with minerals daily  NUTRITION DIAGNOSIS:   Increased nutrient needs related to post-op healing as evidenced by estimated needs.  GOAL:   Patient will meet greater than or equal to 90% of their needs  MONITOR:   PO intake, Supplement acceptance  REASON FOR ASSESSMENT:   Consult Assessment of nutrition requirement/status  ASSESSMENT:   Pt with medical history significant for Diabetes, hypertension hyponatremia, gout, nephrolithiasis, independent in ADLs, being admitted with a right proximal femur fracture from an accidental fall when she tripped over something on the floor while doing her laundry  Pt admitted with rt proximal femur fracture.   Per orthopedics notes, plan for ORIF today. Pt is NPO for procedure.   Spoke with pt at bedside, who reports feeling better today. She reports looking forward to surgery. She understands rationale for NPO order.   PTA pt had a good appetite. She lives with her husband and son. Pt cooks and son does the grocery shopping. She generally consumes 3 meals per day (Breakfast: eggs, grits, bacon, and yogurt; Lunch: leftovers from the night before; Dinner: meat, starch, and vegetable). Pt drinks primarily water and also consumes 1-2 Glucerna supplements daily.   Pt shares that she experienced weight loss in the past, but has been working on her intake to improve this as instructed by her doctor. She shares her UBW is around 140# and she used to weight about 125#. Reviewed wt hx; pt has experienced a 8.3% wt loss over the past 6 months, which is not significant for time frame but concerning given advanced age. Pt shares that she usually gets around well at home, using a chance and walker for balance.   Discussed importance of good meal and supplement  intake to promote healing. Pt amenable to supplements.    Lab Results  Component Value Date   HGBA1C 7.1 (H) 01/21/2023   PTA DM medications are 1500 mg metformin daily.   Labs reviewed: Na: 133, CBGS: 153-171 (inpatient orders for glycemic control are 0-9 units insulin aspart every 4 hours).    NUTRITION - FOCUSED PHYSICAL EXAM:  Flowsheet Row Most Recent Value  Orbital Region Mild depletion  Upper Arm Region Mild depletion  Thoracic and Lumbar Region No depletion  Buccal Region Mild depletion  Temple Region Moderate depletion  Clavicle Bone Region Mild depletion  Clavicle and Acromion Bone Region Mild depletion  Scapular Bone Region Mild depletion  Dorsal Hand Mild depletion  Patellar Region Mild depletion  Anterior Thigh Region Mild depletion  Posterior Calf Region Mild depletion  Edema (RD Assessment) None  Hair Reviewed  Eyes Reviewed  Mouth Reviewed  Skin Reviewed  Nails Reviewed       Diet Order:   Diet Order             Diet NPO time specified  Diet effective now                   EDUCATION NEEDS:   Education needs have been addressed  Skin:  Skin Assessment: Reviewed RN Assessment  Last BM:  Unknown  Height:   Ht Readings from Last 1 Encounters:  07/18/23 5' (1.524 m)    Weight:   Wt Readings from Last 1 Encounters:  07/18/23 50.8 kg    Ideal Body Weight:  45.5 kg  BMI:  Body mass index is 21.87 kg/m.  Estimated Nutritional Needs:   Kcal:  1500-1700  Protein:  75-90 grams  Fluid:  > 1.5 L    Levada Schilling, RD, LDN, CDCES Registered Dietitian III Certified Diabetes Care and Education Specialist Please refer to [facility] for RD and/or RD on-call/weekend/after hours pager

## 2023-07-19 NOTE — ED Notes (Signed)
Pt sleeping in nad but arouses easily when approached. Denies any complaints at this time and is comfortable. Rt DP pulse remains 2+

## 2023-07-19 NOTE — Assessment & Plan Note (Addendum)
Creatinine 1.11 up from baseline of 0.71 Suspect ATN secondary to diuretics-patient on torsemide 20 mg daily Will give a small IV fluid bolus is currently n.p.o.

## 2023-07-19 NOTE — Anesthesia Procedure Notes (Signed)
Spinal  Patient location during procedure: OR Reason for block: surgical anesthesia Staffing Performed: resident/CRNA  Anesthesiologist: Piscitello, Cleda Mccreedy, MD Resident/CRNA: Mathews Argyle, CRNA Performed by: Mathews Argyle, CRNA Authorized by: Rosaria Ferries, MD   Preanesthetic Checklist Completed: patient identified, IV checked, site marked, risks and benefits discussed, surgical consent, monitors and equipment checked, pre-op evaluation and timeout performed Spinal Block Patient position: right lateral decubitus Prep: ChloraPrep and site prepped and draped Patient monitoring: heart rate, continuous pulse ox, blood pressure and cardiac monitor Approach: midline Location: L4-5 Injection technique: single-shot Needle Needle type: Whitacre and Introducer  Needle gauge: 24 G Needle length: 9 cm Assessment Events: CSF return Additional Notes Negative paresthesia. Negative blood return. Positive free-flowing CSF. Expiration date of kit checked and confirmed. Patient tolerated procedure well, without complications.

## 2023-07-19 NOTE — Anesthesia Preprocedure Evaluation (Signed)
Anesthesia Evaluation  Patient identified by MRN, date of birth, ID band Patient awake    Reviewed: Allergy & Precautions, NPO status , Patient's Chart, lab work & pertinent test results  History of Anesthesia Complications Negative for: history of anesthetic complications  Airway Mallampati: III  TM Distance: >3 FB Neck ROM: full    Dental no notable dental hx.    Pulmonary neg pulmonary ROS   Pulmonary exam normal        Cardiovascular hypertension, On Medications Normal cardiovascular exam     Neuro/Psych negative neurological ROS  negative psych ROS   GI/Hepatic Neg liver ROS,GERD  Controlled,,  Endo/Other  negative endocrine ROSdiabetes    Renal/GU ARFRenal disease     Musculoskeletal   Abdominal   Peds  Hematology negative hematology ROS (+)   Anesthesia Other Findings Past Medical History: No date: Actinic keratosis 04/07/2010: Basal cell carcinoma     Comment:  Right paranasal.  No date: Diabetes mellitus without complication (HCC)  History reviewed. No pertinent surgical history.  BMI    Body Mass Index: 21.87 kg/m      Reproductive/Obstetrics negative OB ROS                              Anesthesia Physical Anesthesia Plan  ASA: 2  Anesthesia Plan: Spinal   Post-op Pain Management: Regional block* and Ofirmev IV (intra-op)*   Induction: Intravenous  PONV Risk Score and Plan: 2 and Propofol infusion and TIVA  Airway Management Planned: Natural Airway and Nasal Cannula  Additional Equipment:   Intra-op Plan:   Post-operative Plan:   Informed Consent: I have reviewed the patients History and Physical, chart, labs and discussed the procedure including the risks, benefits and alternatives for the proposed anesthesia with the patient or authorized representative who has indicated his/her understanding and acceptance.     Dental Advisory Given  Plan  Discussed with: Anesthesiologist, CRNA and Surgeon  Anesthesia Plan Comments: (Patient reports no bleeding problems and no anticoagulant use.  Plan for spinal with backup GA  Patient consented for risks of anesthesia including but not limited to:  - adverse reactions to medications - damage to eyes, teeth, lips or other oral mucosa - nerve damage due to positioning  - risk of bleeding, infection and or nerve damage from spinal that could lead to paralysis - risk of headache or failed spinal - damage to teeth, lips or other oral mucosa - sore throat or hoarseness - damage to heart, brain, nerves, lungs, other parts of body or loss of life  Patient voiced understanding and assent.)         Anesthesia Quick Evaluation

## 2023-07-19 NOTE — ED Provider Notes (Signed)
[facility] Provider Note    Event Date/Time   First MD Initiated Contact with Patient 07/18/23 2338     (approximate)   History   Hip Pain (Pt to ER via EMS with c/o rt hip pain s/p mechanical fall tripping over box. Denies any other injuries or any LOC. Takes only low dose ASA for anticoag.)   HPI  Tracy Spears is a 89 y.o. female who presents to the ED for evaluation of Hip Pain (Pt to ER via EMS with c/o rt hip pain s/p mechanical fall tripping over box. Denies any other injuries or any LOC. Takes only low dose ASA for anticoag.)   Patient presents to the ED after mechanical fall.  She had done some laundry, was putting some clothes away and tripped over something causing her to fall onto her right hip.  No head trauma or syncope.  No other pain beyond her right hip   Physical Exam   Triage Vital Signs: ED Triage Vitals  Encounter Vitals Group     BP --      Systolic BP Percentile --      Diastolic BP Percentile --      Pulse --      Resp --      Temp --      Temp src --      SpO2 --      Weight 07/18/23 2354 112 lb (50.8 kg)     Height 07/18/23 2354 5' (1.524 m)     Head Circumference --      Peak Flow --      Pain Score 07/18/23 2352 6     Pain Loc --      Pain Education --      Exclude from Growth Chart --     Most recent vital signs: Vitals:   07/19/23 0100 07/19/23 0108  BP: (!) 148/72   Pulse: 60   Resp: (!) 23   Temp:  98 F (36.7 C)  SpO2: 95%     General: Awake, no distress.  CV:  Good peripheral perfusion.  Resp:  Normal effort.  Abd:  No distention.  MSK:  No deformity noted.  Neuro:  No focal deficits appreciated. Other:  Mild tenderness with logrolling the right leg.   ED Results / Procedures / Treatments   Labs (all labs ordered are listed, but only abnormal results are displayed) Labs Reviewed  CBC WITH DIFFERENTIAL/PLATELET  BASIC METABOLIC PANEL  TYPE AND SCREEN    EKG Sinus rhythm with a  rate of 58 bpm.  Normal axis and intervals.  No clear signs of acute ischemia.  RADIOLOGY CXR interpreted by me without evidence of acute cardiopulmonary pathology. Plain film of the pelvis and right hip interpreted by me with a subtrochanteric proximal femur fracture  Official radiology report(s): DG Chest Portable 1 View  Result Date: 07/19/2023 CLINICAL DATA:  Preop for hip fracture. EXAM: PORTABLE CHEST 1 VIEW COMPARISON:  01/20/2023 FINDINGS: Stable cardiomegaly. Aortic atherosclerotic calcification. Pulmonary vascular congestion. No focal consolidation, pleural effusion, or pneumothorax. No displaced rib fractures. IMPRESSION: Cardiomegaly with pulmonary vascular congestion, similar to prior. Electronically Signed   By: Minerva Fester M.D.   On: 07/19/2023 00:46   DG Hip Unilat W or Wo Pelvis 2-3 Views Right  Result Date: 07/19/2023 CLINICAL DATA:  Fall with right hip pain. EXAM: DG HIP (WITH OR WITHOUT PELVIS) 2-3V RIGHT COMPARISON:  12/13/2020 FINDINGS: Acute displaced fracture of the proximal  right femur. The fracture line courses obliquely from the lesser trochanter inferolaterally into the femoral diaphysis. There is apex lateral angulation. No dislocation of the femoral head. Degenerative changes pubic symphysis, both hips, SI joints and lower lumbar spine. Demineralization. IMPRESSION: Acute displaced angulated fracture of the proximal right femur. Electronically Signed   By: Minerva Fester M.D.   On: 07/19/2023 00:45    PROCEDURES and INTERVENTIONS:  .1-3 Lead EKG Interpretation  Performed by: Delton Prairie, MD Authorized by: Delton Prairie, MD     Interpretation: normal     ECG rate:  60   ECG rate assessment: normal     Rhythm: sinus rhythm     Ectopy: none     Conduction: normal     Medications - No data to display   IMPRESSION / MDM / ASSESSMENT AND PLAN / ED COURSE  I reviewed the triage vital signs and the nursing notes.  Differential diagnosis includes, but is  not limited to, pelvic ring fracture, femur fracture, syncope, stroke  {Patient presents with symptoms of an acute illness or injury that is potentially life-threatening.  Pleasant woman presents from home after mechanical fall with evidence of a proximal subchondral Tarik femur fracture requiring medical admission for orthopedic fixation.  No neurologic deficits or signs of additional trauma.  Imaging, as above.  Pending preoperative blood work.  Consult with medicine and orthopedics  Clinical Course as of 07/19/23 0112  Mon Jul 19, 2023  0045 I consult with ortho, Dr. Rosann Auerbach,  [DS]    Clinical Course User Index [DS] Delton Prairie, MD     FINAL CLINICAL IMPRESSION(S) / ED DIAGNOSES   Final diagnoses:  Closed displaced subtrochanteric fracture of right femur, initial encounter [facility])  Fall, initial encounter     Rx / DC Orders   ED Discharge Orders     None        Note:  This document was prepared using Dragon voice recognition software and may include unintentional dictation errors.   Delton Prairie, MD 07/19/23 [phone]

## 2023-07-19 NOTE — Op Note (Signed)
ORTHOPEDIC OPERATIVE REPORT Tracy Spears [id number] 07/19/2023 Pre-op Diagnosis:  Closed Right comminuted subtrochanteric fracture Post-op Diagnosis:  Closed Right comminuted subtrochanteric fracture Procedure:  INTRAMEDULLARY (IM) NAIL INTERTROCHANTERIC:  Surgeon:  Sundra Aland. Rosann Auerbach, MD Assistant Surgeon:  Circulator: Susa Simmonds, RN Relief Circulator: Waymon Amato, RN Relief Scrub: Vernell Leep Scrub Person: Ernie Hew Vendor Representative : Josem Kaufmann Anesthesia Type:   Spinal/Epidural Anesthesia Fluids:   400 ml of crystalloid. Estimated Blood Loss:   100 ml Urine output:   No Foley was employed for this case. Tourniquet:   No tourniquet was employed for the case. Complications:   None. Disposition:   The patient was taken to the PACU in stable condition having tolerated the procedure well. Grafts/Implants:    Implant Name Type Inv. Item Serial No. Manufacturer Lot No. LRB No. Used Action  NAIL TROCH FIX LNG [id number] - [id number] Nail NAIL TROCH FIX LNG [id number]  [employer] [id number] Right 1 Implanted  SCREW LAG TFNA 90 HIP - [id number] Screw SCREW LAG TFNA 90 HIP  [employer]  Right 1 Implanted  SCREW LOCK STAR 5X48 - [id number] Screw SCREW LOCK STAR 5X48  [employer]  Right 1 Implanted   Specimen(s):   None Indication for surgery:   Patient is an 89 year old female who suffered a ground-level fall while doing her laundry.  She suffered a closed right subtrochanteric hip fracture.  Surgery was recommended for mobilization, pain control, and fracture healing optimization. Operative technique:   After obtaining informed consent and having the patient medically cleared, the patient was taken to the operating room where they were placed under Spinal/Epidural Anesthesia in their bed for comfort. The patient was then positioned onto the fracture table in the supine position on a well-padded peroneal post. Well- padded traction boots were  applied and the lower extremities placed in gentle traction in the scissor position. The right hip was isolated with a nonsterile plastic U-drape. Fluoroscopy was employed to grossly align the fracture by use of the traction table.  The right hip was prepped and draped in routine fashion with Hibiclens and alcohol with a final ChloraPrep. The shower curtain draping technique was employed. Under fluoroscopic guidance the anatomy of the greater trochanter and femoral shaft were palpated and the anatomy outlined with a marking pen. A time-out then occurred per joint commission standards to positively identify the patient, the operative site, the operation, and the surgeon. All concurred.  An incision was made in the skin approximately 5 cm in length superior to the right hip greater trochanter region in line with the shaft. Mayo scissors were used to bluntly dissect down to the tip of the greater trochanter. A guide pin was placed at the tip of the greater trochanter and the reamer used to open the canal under fluoroscopic guidance. A ball-tipped guide wire was then carefully passed down the femoral canal to the distal femur. Wire position and fracture reduction were again confirmed in both the AP and lateral planes with fluoroscopy. I then began sequentially reaming from a size 8 to a size 12 reamer. A 11 mm Synthes TFN-A nail was selected after measurement in standard technique. The proximal femur was reamed to 16 mm to account for the proximal diameter of the nail. The nail was placed in the jig and the alignment holes check. I then placed the nail over the guide wire and passed it gently into the femoral canal. Under fluoroscopic guidance I aligned the head and neck region and  the cannula was placed laterally through a small stab incision. The guide pin was placed up the head and neck. The length measured for 90 mm screw. I then hand tapped the hole and found the bone quality in the femoral head to be quite  good. The screw was positioned under fluoroscopic guidance to make sure it was appropriately positioned centered in the head. It was dynamized in standard fashion.   At this time traction was taken off the right lower extremity to gently impact the fracture. The distal interlocking screw was placed through small stab incisions from lateral to medial in routine fashion employing the freehand technique. The screw was measured and inserted by hand. Final images were obtained to make sure of appropriate fracture reduction, implant positioning, and no screws in the joint.  The incisions were copiously lavaged with normal saline by bulb syringe. The deep fascia was closed with 0 Vicryl in an interrupted pattern. The subcutaneous tissue was closed with 2-0 Vicryl in an inverted interrupted pattern. The skin was stapled. The patient was covered with a nonadherent sterile dressing and carefully positioned back into their bed. At the end the case limb lengths appeared equal. External rotation of the feet appeared equal. The toes were pink and warm with a brisk capillary time and no complications were noted from the traction boots. The patient was taken to the recovery room in stable condition having tolerated the procedure well. All sponge and needle counts were correct at the end of the case.  Implants: Synthes TFN-A Trochanteric Nail 11 mm x 380 mm with a 90 mm screw proximally.  The patient's family was updated on the patient's condition and the operative findings.    Cecil Cranker M.D. 07/19/2023 5:56 PM

## 2023-07-19 NOTE — Assessment & Plan Note (Signed)
Blood glucose 220.  Sliding scale insulin coverage

## 2023-07-19 NOTE — Consult Note (Signed)
ORTHOPEDIC CONSULTATION  Tracy Spears [id number]  07/19/2023  CC:  Chief Complaint  Patient presents with   Hip Pain    Pt to ER via EMS with c/o rt hip pain s/p mechanical fall tripping over box. Denies any other injuries or any LOC. Takes only low dose ASA for anticoag.    History of Present IlIness: The patient is a 89 y.o. female who suffered a ground-level fall last night while carrying her laundry through the house.  She had no loss of consciousness or dizziness prior to the fall.  The patient reports she had immediate onset of right hip pain and was unable to ambulate.  She was transported to the local emergency department where a right subtrochanteric fracture was diagnosed.  Orthopedics was consulted.  The patient has been evaluated by the medical team and cleared for surgery.  She only takes aspirin as a blood thinner.  Admission history and physical examination history for completeness: Tracy Spears is a 89 y.o. female with medical history significant for Diabetes, hypertension hyponatremia, gout, nephrolithiasis, independent in ADLs, being admitted with a right proximal femur fracture from an accidental fall when she tripped over something on the floor while doing her laundry.  She was previously in her usual state of health and denied preceding lightheadedness, blurred vision, headache, chest pain, shortness of breath or palpitations.  She denied hitting her head or losing consciousness.   PMH:  Past Medical History:  Diagnosis Date   Actinic keratosis    Basal cell carcinoma 04/07/2010   Right paranasal.    Diabetes mellitus without complication (HCC)     SH: History reviewed. No pertinent surgical history.  ALL:  Allergies  Allergen Reactions   Lisinopril Other (See Comments)    cough    MED:  Medications Prior to Admission  Medication Sig Dispense Refill Last Dose   aspirin 81 MG chewable tablet Chew 81 mg by mouth daily.   07/18/2023 at 0900    atorvastatin (LIPITOR) 10 MG tablet Take 1 tablet by mouth daily.   07/17/2023 at 2100   azelastine (ASTELIN) 0.1 % nasal spray Place 2 sprays into both nostrils daily as needed for allergies or rhinitis.   prn   carvedilol (COREG) 25 MG tablet Take 25 mg by mouth 2 (two) times daily.   07/18/2023 at 0900   Cholecalciferol 50 MCG (2000 UT) CAPS Take 1 capsule by mouth daily.   07/18/2023 at 0900   colchicine 0.6 MG tablet Take 0.6 mg by mouth daily as needed.   awhile   diclofenac Sodium (VOLTAREN) 1 % GEL Apply 2 g topically 3 (three) times daily as needed. 50 g 0 awhile   ferrous sulfate 325 (65 FE) MG tablet Take 325 mg by mouth daily with breakfast.   07/18/2023 at 0900   gabapentin (NEURONTIN) 300 MG capsule Take 1 capsule by mouth 3 (three) times daily.   07/18/2023 at 1230   losartan (COZAAR) 100 MG tablet Take 1 tablet (100 mg total) by mouth daily. 30 tablet 0 07/18/2023 at 0900   metFORMIN (GLUCOPHAGE-XR) 500 MG 24 hr tablet Take 1,500 mg by mouth at bedtime.   07/17/2023 at 2100   Multiple Vitamin (MULTI-VITAMIN) tablet Take 1 tablet by mouth daily.   07/18/2023 at 0900   pantoprazole (PROTONIX) 40 MG tablet Take 1 tablet by mouth daily.   07/18/2023 at 0900   potassium chloride (KLOR-CON) 10 MEQ tablet Take 10 mEq by mouth daily.   07/18/2023 at 0900  torsemide (DEMADEX) 20 MG tablet Take 20 mg by mouth once.   07/18/2023 at 0900   traZODone (DESYREL) 50 MG tablet Take 1 tablet by mouth at bedtime as needed.   2-3 months   triamcinolone cream (KENALOG) 0.1 % Apply to itchy rash on body 1-2 times daily until improved. Avoid face, groin, underarms. (Patient not taking: Reported on 07/19/2023) 80 g 2 Not Taking    All home medications have been reviewed as documented in the medication reconciliation portion of the patient record.  FH:  Family History  Problem Relation Age of Onset   Breast cancer Other     Social:  reports that she has never smoked. She has never used smokeless  tobacco. She reports that she does not drink alcohol and does not use drugs.  Review of Systems: General: Denies fever, chills, weight loss Eyes: Denies blurry vision, changes in vision ENT: Denies sore throat, congestions, nosebleeds CV: Denies chest pain, palpitations Respiratory: Denies shortness of breath, wheezing, cough Gl: Denies abdominal pain, nausea, vomiting GU: Denies hematuria Integumentary: Denies rashes or lesions Neuro: Denies headache, dizziness Psych: Negative Hem/Onc: Denies easy bruising or bleeding disorders Musculoskeletal: See HPI above.  Vitals: BP (!) 152/60 (BP Location: Right Arm)   Pulse (!) 58   Temp 98.1 F (36.7 C)   Resp 17   Ht 5' (1.524 m)   Wt 50.8 kg   SpO2 90%   BMI 21.87 kg/m    Physical Exam: General: Awake, alert and oriented, no acute distress. Eyes: Pupils reactive, EOMI, normal conjunctiva, no scleral icterus. HENT: Normocephalic, atraumatic, normal hearing, moist oral mucosa Neck: Supple, non-tender, no cervical lymphadenopathy. Lungs: Chest rise is symmetric, non-labored respiration, chest wall nontender to palpation Heart: Normal rate by palpation, normal peripheral perfusion Abdomen: Soft, non-tender, non-distended. Pelvis is stable. Skin: Skin envelope intact, dry and pink, no rashes or lesions, no signs of infection. Neurologic: Awake, alert, and oriented X3 Psychiatric: Cooperative, appropriate mood and affect.  Musculoskeletal: Evaluation of the patient's bilateral upper extremities and left lower extremity reveal no areas of tenderness or limitations to range of motion. Any motion about the patient's right hip is extremely painful. Palpation of the right distal femur, knee, tib-fib region, ankle and foot show no areas of tenderness. The patient's calves are soft and nontender with no palpable cords. Homans testing is negative. Dorsalis pedis and posterior tibial pulse are 2+ and symmetric. The patient's toes are pink and warm  with a brisk capillary refill time. The patient is able to gently move their ankles and toes on command. Sensation is intact over all lower extremity dermatomal patterns.   Radiographic findings: Radiographs of the patient's right proximal femur and pelvis were independently evaluated on the PACS system.  The patient has a mildly comminuted subtrochanteric proximal femur fracture on the right side.  There is old healed left-sided inferior and superior pubic rami fractures.  No other pelvic fractures are noted.  No abnormalities are noted in the left proximal femur that is visualized.   Labs:  Recent Labs    07/19/23 0132  HGB 10.1*   Recent Labs    07/19/23 0132  WBC 7.2  RBC 3.51*  HCT 30.9*  PLT 195   Recent Labs    07/19/23 0132  NA 133*  K 3.9  CL 99  CO2 27  BUN 29*  CREATININE 1.11*  GLUCOSE 220*  CALCIUM 9.1   No results for input(s): "LABPT", "INR" in the last 72 hours.  Assessment/Plan:    Assessment: 89 year old female with closed right subtrochanteric proximal femur fracture with mild comminution.   Plan:  I discussed with the patient that I would recommend open reduction/internal fixation with a trochanteric nail to stabilize the fracture and allow appropriate healing and early mobilization to decrease the risk of blood clots and pneumonia.  She lives with her family but is very independent of activities of daily living and would like to proceed with surgery after discussing the multiple risks versus potential benefits.  She will be kept n.p.o. and placed on the operative schedule for later today if she remains medically stable.  We discussed that she may be able to return home after surgery with assistance from her family but may have to consider short-term stay in a rehabilitation facility.  The patient has been extensively counseled on the potential benefits versus risks of surgery including but not limited to postoperative nausea, wound healing issues,  infection, blood clots, breakage or loosening of any metallic implants or anchors, limb length inequality, dislocation, and possible need for future revision surgery. We also discussed the significant medical risks of stroke, myocardial infarction, anesthetic complications, or exacerbation of their diabetes mellitus. After discussing the multiple pros and cons, they would like to proceed with surgery as discussed. Their consent was reviewed and signed and placed in the chart.   Cecil Cranker M.D. 07/19/2023 8:57 AM

## 2023-07-19 NOTE — Assessment & Plan Note (Signed)
Continue losartan and carvedilol 

## 2023-07-19 NOTE — Progress Notes (Signed)
Regional assessment - pt moving lower extremities and can feel at top of bilateral knees.  Dr. Randa Ngo stated ok to d/c back to 1A.  VSS, denies pain

## 2023-07-19 NOTE — Assessment & Plan Note (Addendum)
N.p.o. Pain control Ortho aware and formally consulted Low risk of perioperative cardiopulmonary disease.  Patient with chronic stable comorbidities and good functional status Echocardiogram 01/2023 : normal left ventricular function with LVEF 60 to 65%

## 2023-07-20 ENCOUNTER — Encounter: Payer: Self-pay | Admitting: Orthopedic Surgery

## 2023-07-20 DIAGNOSIS — S72031A Displaced midcervical fracture of right femur, initial encounter for closed fracture: Secondary | ICD-10-CM | POA: Diagnosis not present

## 2023-07-20 LAB — BASIC METABOLIC PANEL
Anion gap: 7 (ref 5–15)
BUN: 19 mg/dL (ref 8–23)
CO2: 25 mmol/L (ref 22–32)
Calcium: 8.9 mg/dL (ref 8.9–10.3)
Chloride: 101 mmol/L (ref 98–111)
Creatinine, Ser: 0.86 mg/dL (ref 0.44–1.00)
GFR, Estimated: >60 mL/min (ref >60)
Glucose, Bld: 137 mg/dL — ABNORMAL HIGH (ref 70–99)
Potassium: 3.7 mmol/L (ref 3.5–5.1)
Sodium: 133 mmol/L — ABNORMAL LOW (ref 135–145)

## 2023-07-20 LAB — GLUCOSE, CAPILLARY
Glucose-Capillary: 108 mg/dL — ABNORMAL HIGH (ref 70–99)
Glucose-Capillary: 138 mg/dL — ABNORMAL HIGH (ref 70–99)
Glucose-Capillary: 154 mg/dL — ABNORMAL HIGH (ref 70–99)
Glucose-Capillary: 159 mg/dL — ABNORMAL HIGH (ref 70–99)
Glucose-Capillary: 183 mg/dL — ABNORMAL HIGH (ref 70–99)
Glucose-Capillary: 189 mg/dL — ABNORMAL HIGH (ref 70–99)

## 2023-07-20 LAB — CBC
HCT: 24.8 % — ABNORMAL LOW (ref 36.0–46.0)
Hemoglobin: 8.2 g/dL — ABNORMAL LOW (ref 12.0–15.0)
MCH: 28.2 pg (ref 26.0–34.0)
MCHC: 33.1 g/dL (ref 30.0–36.0)
MCV: 85.2 fL (ref 80.0–100.0)
Platelets: 144 10*3/uL — ABNORMAL LOW (ref 150–400)
RBC: 2.91 MIL/uL — ABNORMAL LOW (ref 3.87–5.11)
RDW: 16.4 % — ABNORMAL HIGH (ref 11.5–15.5)
WBC: 8.4 10*3/uL (ref 4.0–10.5)
nRBC: 0 % (ref 0.0–0.2)

## 2023-07-20 MED ORDER — INSULIN ASPART 100 UNIT/ML IJ SOLN
0.0000 [IU] | Freq: Three times a day (TID) | INTRAMUSCULAR | Status: DC
  Administered 2023-07-20 – 2023-07-22 (×8): 2 [IU] via SUBCUTANEOUS
  Administered 2023-07-22: 1 [IU] via SUBCUTANEOUS
  Administered 2023-07-23 (×3): 3 [IU] via SUBCUTANEOUS
  Administered 2023-07-23 – 2023-07-24 (×2): 2 [IU] via SUBCUTANEOUS
  Administered 2023-07-24: 1 [IU] via SUBCUTANEOUS
  Administered 2023-07-24: 2 [IU] via SUBCUTANEOUS
  Administered 2023-07-24 – 2023-07-25 (×2): 5 [IU] via SUBCUTANEOUS
  Administered 2023-07-25: 2 [IU] via SUBCUTANEOUS
  Administered 2023-07-25 – 2023-07-26 (×2): 1 [IU] via SUBCUTANEOUS
  Administered 2023-07-26: 3 [IU] via SUBCUTANEOUS
  Filled 2023-07-20 (×22): qty 1

## 2023-07-20 MED ORDER — POLYETHYLENE GLYCOL 3350 17 G PO PACK
17.0000 g | PACK | Freq: Every day | ORAL | Status: DC
  Administered 2023-07-20 – 2023-07-23 (×4): 17 g via ORAL
  Filled 2023-07-20 (×7): qty 1

## 2023-07-20 MED ORDER — SENNOSIDES-DOCUSATE SODIUM 8.6-50 MG PO TABS
1.0000 | ORAL_TABLET | Freq: Every day | ORAL | Status: DC
  Administered 2023-07-20 – 2023-07-25 (×5): 1 via ORAL
  Filled 2023-07-20 (×7): qty 1

## 2023-07-20 NOTE — Evaluation (Signed)
Physical Therapy Evaluation Patient Details Name: Tracy Spears MRN: [id number] DOB: 03-Feb-1933 Today's Date: 07/20/2023  History of Present Illness  Patient is a 89 y.o. female with medical history significant for Diabetes, hypertension hyponatremia, gout, nephrolithiasis, independent in ADLs, being admitted with a right proximal femur fracture from an accidental fall. Current MD assessment: HTN, AKI, HLD, and R proximal femur fracture, now s/p R intramedullary nail.  Clinical Impression  Pt was pleasant and motivated to participate during the session and put forth good effort throughout. Pt is currently Mod A +2 for bed mobility, Min A +2 for STS's and amb with RW. Pt  and pt's family educated on and provided  demonstration on PWB precautions and sequencing when taking steps or performing transfers. Pt verbalized understanding and is showing good recall of precautions when performing steps during session. Once up, she was able to take a few steps forwards and backwards for a total of ~3 feet using a step-to pattern while maintaining PWB precautions. She denies feelings of SOB or dizziness during mobility assessed. Pt will benefit from continued PT services upon discharge to safely address deficits listed in patient problem list for decreased caregiver assistance and eventual return to PLOF.       If plan is discharge home, recommend the following: A lot of help with walking and/or transfers;A lot of help with bathing/dressing/bathroom;Assist for transportation;Assistance with cooking/housework   Can travel by private vehicle   No    Equipment Recommendations Other (comment) (TBD at next venue of care)  Recommendations for Other Services       Functional Status Assessment Patient has had a recent decline in their functional status and demonstrates the ability to make significant improvements in function in a reasonable and predictable amount of time.     Precautions / Restrictions  Precautions Precautions: Fall Restrictions Weight Bearing Restrictions: Yes RLE Weight Bearing: Partial weight bearing RLE Partial Weight Bearing Percentage or Pounds: 25%      Mobility  Bed Mobility Overal bed mobility: Needs Assistance Bed Mobility: Sit to Supine, Supine to Sit     Supine to sit: +2 for physical assistance, Mod assist Sit to supine: +2 for physical assistance, Mod assist   General bed mobility comments: Pt given VC's for sequencing and hand placement, ultimately needing +2 to get up/down in bed.    Transfers Overall transfer level: Needs assistance Equipment used: Rolling walker (2 wheels) Transfers: Sit to/from Stand Sit to Stand: +2 physical assistance, Min assist           General transfer comment: Pt educated on performing STS's with RLE placement education to maintain 25% PWB status. She was able to stand with self selected strategy of pulling up on stabilzed RW.    Ambulation/Gait Ambulation/Gait assistance: Min assist, +2 physical assistance Gait Distance (Feet): 3 Feet Assistive device: Rolling walker (2 wheels) Gait Pattern/deviations: Decreased step length - right, Decreased step length - left, Decreased weight shift to right, Decreased stance time - right, Step-to pattern Gait velocity: decreased     General Gait Details: Able to walk a few steps forwards and backwards using step to technique while maintaining PWB precations by only keeping R toes on floor, with VC's to keep weightshift on to RLE to a minimum. Pt overall mainting PWB precautions well, with good recall from educaiton and demonstration.  Stairs            Wheelchair Mobility     Tilt Bed    Modified Rankin (Stroke  Patients Only)       Balance Overall balance assessment: Needs assistance   Sitting balance-Leahy Scale: Fair     Standing balance support: Reliant on assistive device for balance, During functional activity, Bilateral upper extremity  supported Standing balance-Leahy Scale: Poor Standing balance comment: needs assist to maintain                             Pertinent Vitals/Pain Pain Assessment Pain Assessment: 0-10 Pain Score: 5  Pain Location: R hip Pain Descriptors / Indicators: Spasm, Grimacing, Guarding, Sore Pain Intervention(s): Monitored during session, Limited activity within patient's tolerance    Home Living Family/patient expects to be discharged to:: Private residence Living Arrangements: Spouse/significant other;Children (adult son; works during day) Available Help at Discharge: Family;Available PRN/intermittently Type of Home: House Home Access: Level entry       Home Layout: One level Home Equipment: Rollator (4 wheels);Rolling Walker (2 wheels);Hand held shower head;Grab bars - tub/shower;Shower seat      Prior Function Prior Level of Function : Independent/Modified Independent             Mobility Comments: mixed use of SPC and 3 wheeled walker, household amb at baseline. Only endorses x1 recent fall (reason for admission). Still does laundry, cooks/cleans sometimes as well. Pt does not drive or leave house at baseline, pt's husband and son get groceries. ADLs Comments: Pt reports being Ind with bathroom use and showers     Extremity/Trunk Assessment   Upper Extremity Assessment Upper Extremity Assessment: Overall WFL for tasks assessed    Lower Extremity Assessment Lower Extremity Assessment: Generalized weakness;RLE deficits/detail RLE Deficits / Details: s/p R hip IM nail, WBing precautions. Pt having difficuly lifting leg against gravity in supine and seated positions       Communication   Communication Communication: No apparent difficulties  Cognition Arousal: Alert Behavior During Therapy: WFL for tasks assessed/performed Overall Cognitive Status: Within Functional Limits for tasks assessed                                           General Comments      Exercises Other Exercises Other Exercises: Pt and pt's family provided education and demonstration on PWB precautions and sequencing when taking steps. Pt verbalized understanding and showing good recall of precautions when performing steps during session. Other Exercises: Pt and Pt's family educated on benefits of frequent mobilization within PWBing restricitons to maintain funcitonal strength   Assessment/Plan    PT Assessment Patient needs continued PT services  PT Problem List Decreased strength;Decreased coordination;Decreased range of motion;Decreased activity tolerance;Decreased balance;Decreased mobility       PT Treatment Interventions DME instruction;Balance training;Gait training;Stair training;Functional mobility training;Therapeutic activities;Therapeutic exercise    PT Goals (Current goals can be found in the Care Plan section)  Acute Rehab PT Goals Patient Stated Goal: walk again, do tasks/chores at home again PT Goal Formulation: With patient Time For Goal Achievement: 08/02/23 Potential to Achieve Goals: Fair    Frequency 7X/week     Co-evaluation               AM-PAC PT "6 Clicks" Mobility  Outcome Measure Help needed turning from your back to your side while in a flat bed without using bedrails?: A Lot Help needed moving from lying on your back to sitting on the side  of a flat bed without using bedrails?: A Lot Help needed moving to and from a bed to a chair (including a wheelchair)?: A Lot Help needed standing up from a chair using your arms (e.g., wheelchair or bedside chair)?: A Lot Help needed to walk in hospital room?: A Lot Help needed climbing 3-5 steps with a railing? : A Lot 6 Click Score: 12    End of Session Equipment Utilized During Treatment: Gait belt Activity Tolerance: Patient tolerated treatment well;Patient limited by pain Patient left: in bed;with family/visitor present;with bed alarm set;with call  bell/phone within reach Nurse Communication: Mobility status (bleeding at IV site/pt concerns of pain at IV site) PT Visit Diagnosis: Pain;Other abnormalities of gait and mobility (R26.89);Difficulty in walking, not elsewhere classified (R26.2);Muscle weakness (generalized) (M62.81) Pain - Right/Left: Right Pain - part of body: Hip    Time: 1030-1104 PT Time Calculation (min) (ACUTE ONLY): 34 min   Charges:                 Cecile Sheerer, SPT 07/20/23, 1:03 PM

## 2023-07-20 NOTE — Progress Notes (Signed)
[facility] PROGRESS NOTE  Tracy Spears (DOB: 1932/10/11) IHK:[id number] PCP: Lauro Regulus, MD  Brief Narrative: Tracy Spears is an active 89 y.o. female with a history of T2DM, HTN, hyponatremia, gout, nephrolithiasis who presented to the ED on 07/18/2023 after tripping on laundry at home, falling on right hip with significant pain. Confirmed to have subtrochanteric right femur fracture. This was treated with intertrochanteric IM nailing by Dr. Rosann Auerbach 11/11. PT/OT evaluations postoperatively recommending SNF, TOC consulted. Postoperative anemia will be monitored.   Subjective: Some nausea with breakfast this morning, but pain is controlled, no vomiting, did eat some. Family at bedside. She's been treated for anemia in the past with oral iron. She has had constipation during hospitalization in the past.   Objective: BP 122/81 (BP Location: Right Arm)   Pulse 64   Temp 98 F (36.7 C)   Resp 18   Ht 5' (1.524 m)   Wt 50.8 kg   SpO2 90%   BMI 21.87 kg/m   Gen: No distress, elderly and pleasant Pulm: Clear, nonlabored  CV: RRR, no MRG or pitting edema GI: Soft, NT, ND, hypoactive Neuro: Alert and oriented. No new focal deficits. Ext: Warm, no deformities. Distally warm, dry with good cap refill in the RLE. Lateral thigh with c/d/I dressing and ACE wrap more inferiorly on thigh. c/d/i Skin: Right AC peripheral IV site with recent but not ongoing bleeding. No other rashes, lesions or ulcers on visualized skin   Assessment & Plan: Closed right subtrochanteric femur fracture from mechanical fall at home: s/p IM nail 11/11 by Dr. Rosann Auerbach.  - PT/OT evaluations today, verbal report suggests SNF will be necessary. Will consult TOC.  - Continue multimodal pain regimen, attempting to limit narcotics.  - Lovenox for VTE ppx, ok to continue unless uncontrolled bleeding develops.  Acute postoperative blood loss anemia:  - Recheck CBC in AM with type &  screen  Thrombocytopenia: Mild.  - Monitor  HTN, chronic HFpEF: Appears euvolemic. CXR with cardiomegaly (AP film), pulmonary vascular congestion though she has no dyspnea, hypoxia, or crackles on exam. She has no respiratory symptoms and is without dyspnea completely supine. Last echo May 2024 showed LVEF 60-65%, no RWMA, grade 2 diastolic dysfunction. Normal RV and no hemodynamically significant valvulopathy. Severe LAE. - Continue home losartan, carvedilol. Hold torsemide again today given her nausea may limit po intake.  - If respiratory status worsens, may need to diurese.  AKI: Cr 1.11 from baseline of 0.7. - Hold torsemide again today, Cr improving, down to 0.86 today. - Avoid nephrotoxins and recheck in AM.  - Hold gabapentin and metformin    Nausea: Abd exam benign but at risk of constipation - Start regular bowel regimen - Continue prn antiemetics.   T2DM: HbA1c 7.1% indicating adequate control for this patient.  - Continue SSI  HLD:  - Restart statin at discharge  Gout: Quiescent.  Tyrone Nine, MD [facility] [email or web address] 07/20/2023, 11:38 AM

## 2023-07-20 NOTE — Evaluation (Signed)
Occupational Therapy Evaluation Patient Details Name: Tracy Spears MRN: [id number] DOB: 1933/03/16 Today's Date: 07/20/2023   History of Present Illness Patient is a 89 y.o. female with medical history significant for Diabetes, hypertension hyponatremia, gout, nephrolithiasis, independent in ADLs, being admitted with a right proximal femur fracture from an accidental fall. Current MD assessment: HTN, AKI, HLD, and R proximal femur fracture, now s/p R intramedullary nail.   Clinical Impression   Pt seen for OT evaluation this date. Spouse and son present and supportive. Pt was independent in all ADL prior to surgery and denies additional falls in the past year aside from one that led to this hospitalization. Pt is eager to return to PLOF with less pain and improved safety and independence. Pt currently requires MOD A for LB ADL while in seated position due to pain and limited AROM of R hip, anticipate MIN A +2 with RW for ADL transfers given PWBing precautions. Pt able to recall PWBing precaution. Deferred OOB 2/2 pain. RN notified and pt not yet due for next dose. Pt/family educated in how to maintain PWBing RLE during ADL and mobility, AE/DME for LB ADL tasks, falls prevention, home/routines modifications, and incorporating routines to maximize pain medication coverage. Pt would benefit from additional instruction in self care skills and techniques to help maintain precautions with or without assistive devices to support recall and carryover prior to discharge.       If plan is discharge home, recommend the following: A lot of help with walking and/or transfers;A lot of help with bathing/dressing/bathroom;Assistance with cooking/housework;Assist for transportation;Help with stairs or ramp for entrance    Functional Status Assessment  Patient has had a recent decline in their functional status and demonstrates the ability to make significant improvements in function in a reasonable and  predictable amount of time.  Equipment Recommendations  Other (comment);BSC/3in1 (defer)    Recommendations for Other Services       Precautions / Restrictions Precautions Precautions: Fall Restrictions Weight Bearing Restrictions: Yes RLE Weight Bearing: Partial weight bearing RLE Partial Weight Bearing Percentage or Pounds: 25%      Mobility Bed Mobility               General bed mobility comments: deferred 2/2 pain, RN notified    Transfers                          Balance                                           ADL either performed or assessed with clinical judgement   ADL                                         General ADL Comments: Based on deficits noted anticipate pt requires MOD A for LB ADL tasks and MIN A +2 for ADL transfers with RW.     Vision         Perception         Praxis         Pertinent Vitals/Pain Pain Assessment Pain Assessment: 0-10 Pain Score: 7  Pain Location: R hip Pain Descriptors / Indicators: Aching, Guarding Pain Intervention(s): Monitored during session, Limited activity within patient's tolerance, Repositioned, Patient  requesting pain meds-RN notified, Premedicated before session     Extremity/Trunk Assessment Upper Extremity Assessment Upper Extremity Assessment: Overall WFL for tasks assessed   Lower Extremity Assessment Lower Extremity Assessment: Generalized weakness;RLE deficits/detail RLE Deficits / Details: s/p R hip IM nail, WBing precautions. Pt having difficuly lifting leg against gravity in supine and seated positions       Communication Communication Communication: No apparent difficulties   Cognition Arousal: Alert Behavior During Therapy: WFL for tasks assessed/performed Overall Cognitive Status: Within Functional Limits for tasks assessed                                 General Comments: alert and oriented able to follow simple  commands     General Comments       Exercises Other Exercises Other Exercises: Pt/family educated in how to maintain PWBing RLE during ADL and mobility, AE/DME for LB ADL tasks, falls prevention, home/routines modifications, and incorporating routines to maximize pain medication coverage   Shoulder Instructions      Home Living Family/patient expects to be discharged to:: Private residence Living Arrangements: Spouse/significant other;Children (adult son; works during day) Available Help at Discharge: Family;Available PRN/intermittently Type of Home: House Home Access: Level entry     Home Layout: One level     Bathroom Shower/Tub: Walk-in shower (RW accessible, no floor threshold)   Bathroom Toilet: Handicapped height     Home Equipment: Rollator (4 wheels);Rolling Walker (2 wheels);Hand held shower head;Grab bars - tub/shower;Shower seat;Cane - single point;Other (comment);Adaptive equipment Adaptive Equipment: Reacher Additional Comments: 3WW      Prior Functioning/Environment Prior Level of Function : Independent/Modified Independent             Mobility Comments: mixed use of SPC and 3 wheeled walker, household amb at baseline. Only endorses x1 recent fall (reason for admission). Still does laundry, cooks/cleans sometimes as well. Pt does not drive or leave house at baseline, pt's husband and son get groceries. ADLs Comments: Pt reports being Ind with bathroom use and showers        OT Problem List: Decreased strength;Pain;Decreased range of motion;Decreased activity tolerance;Decreased knowledge of use of DME or AE;Impaired balance (sitting and/or standing)      OT Treatment/Interventions: Self-care/ADL training;Therapeutic exercise;Therapeutic activities;DME and/or AE instruction;Patient/family education;Balance training    OT Goals(Current goals can be found in the care plan section) Acute Rehab OT Goals Patient Stated Goal: get better OT Goal Formulation:  With patient/family Time For Goal Achievement: 08/03/23 Potential to Achieve Goals: Good ADL Goals Pt Will Perform Lower Body Dressing: sit to/from stand;with adaptive equipment;with min assist Pt Will Transfer to Toilet: with min assist;bedside commode;ambulating (LRAD, maintaining precautions) Pt Will Perform Toileting - Clothing Manipulation and hygiene: sitting/lateral leans;with supervision Additional ADL Goal #1: Pt will complete ADL/mobility tasks without need for cues to maintain PWBing RLE, 5/5 opportunities.  OT Frequency: Min 1X/week    Co-evaluation              AM-PAC OT "6 Clicks" Daily Activity     Outcome Measure Help from another person eating meals?: None Help from another person taking care of personal grooming?: A Little Help from another person toileting, which includes using toliet, bedpan, or urinal?: A Little Help from another person bathing (including washing, rinsing, drying)?: A Lot Help from another person to put on and taking off regular upper body clothing?: A Little Help from another person to put  on and taking off regular lower body clothing?: A Lot 6 Click Score: 17   End of Session Nurse Communication: Patient requests pain meds  Activity Tolerance: Patient limited by pain Patient left: in bed;with call bell/phone within reach;with bed alarm set;with family/visitor present  OT Visit Diagnosis: Other abnormalities of gait and mobility (R26.89);History of falling (Z91.81);Muscle weakness (generalized) (M62.81);Pain Pain - Right/Left: Right Pain - part of body: Hip                Time: 0102-7253 OT Time Calculation (min): 28 min Charges:  OT General Charges $OT Visit: 1 Visit OT Evaluation $OT Eval Moderate Complexity: 1 Mod OT Treatments $Self Care/Home Management : 8-22 mins  Arman Filter., MPH, MS, OTR/L ascom [phone] 07/20/23, 3:38 PM

## 2023-07-20 NOTE — Plan of Care (Signed)
  Problem: Fluid Volume: Goal: Ability to maintain a balanced intake and output will improve Outcome: Progressing   Problem: Health Behavior/Discharge Planning: Goal: Ability to manage health-related needs will improve Outcome: Progressing   Problem: Nutritional: Goal: Maintenance of adequate nutrition will improve Outcome: Progressing

## 2023-07-20 NOTE — Anesthesia Postprocedure Evaluation (Signed)
Anesthesia Post Note  Patient: Tracy Spears  Procedure(s) Performed: INTRAMEDULLARY (IM) NAIL INTERTROCHANTERIC (Right: Hip)  Patient location during evaluation: Nursing Unit Anesthesia Type: Spinal Level of consciousness: oriented and awake and alert Pain management: pain level controlled Vital Signs Assessment: post-procedure vital signs reviewed and stable Respiratory status: spontaneous breathing and respiratory function stable Cardiovascular status: blood pressure returned to baseline and stable Postop Assessment: no headache, no backache, no apparent nausea or vomiting and patient able to bend at knees Anesthetic complications: no   No notable events documented.   Last Vitals:  Vitals:   07/20/23 0022 07/20/23 0428  BP: 124/63 (!) 130/54  Pulse: 60 66  Resp: 20 18  Temp: 36.5 C 37.3 C  SpO2: 91% 91%    Last Pain:  Vitals:   07/20/23 0542  TempSrc:   PainSc: Asleep                 Starling Manns

## 2023-07-20 NOTE — Plan of Care (Signed)
  Problem: Education: Goal: Ability to describe self-care measures that may prevent or decrease complications (Diabetes Survival Skills Education) will improve Outcome: Progressing Goal: Individualized Educational Video(s) Outcome: Progressing   Problem: Fluid Volume: Goal: Ability to maintain a balanced intake and output will improve Outcome: Progressing   Problem: Health Behavior/Discharge Planning: Goal: Ability to identify and utilize available resources and services will improve Outcome: Progressing   Problem: Metabolic: Goal: Ability to maintain appropriate glucose levels will improve Outcome: Progressing

## 2023-07-21 DIAGNOSIS — S72001A Fracture of unspecified part of neck of right femur, initial encounter for closed fracture: Secondary | ICD-10-CM | POA: Diagnosis not present

## 2023-07-21 LAB — CBC
HCT: 23.4 % — ABNORMAL LOW (ref 36.0–46.0)
Hemoglobin: 8 g/dL — ABNORMAL LOW (ref 12.0–15.0)
MCH: 29.1 pg (ref 26.0–34.0)
MCHC: 34.2 g/dL (ref 30.0–36.0)
MCV: 85.1 fL (ref 80.0–100.0)
Platelets: 149 10*3/uL — ABNORMAL LOW (ref 150–400)
RBC: 2.75 MIL/uL — ABNORMAL LOW (ref 3.87–5.11)
RDW: 16.5 % — ABNORMAL HIGH (ref 11.5–15.5)
WBC: 10.1 10*3/uL (ref 4.0–10.5)
nRBC: 0 % (ref 0.0–0.2)

## 2023-07-21 LAB — GLUCOSE, CAPILLARY
Glucose-Capillary: 155 mg/dL — ABNORMAL HIGH (ref 70–99)
Glucose-Capillary: 164 mg/dL — ABNORMAL HIGH (ref 70–99)
Glucose-Capillary: 189 mg/dL — ABNORMAL HIGH (ref 70–99)
Glucose-Capillary: 181 mg/dL — ABNORMAL HIGH (ref 70–99)
Glucose-Capillary: 166 mg/dL — ABNORMAL HIGH (ref 70–99)

## 2023-07-21 LAB — BASIC METABOLIC PANEL
Anion gap: 11 (ref 5–15)
BUN: 25 mg/dL — ABNORMAL HIGH (ref 8–23)
CO2: 25 mmol/L (ref 22–32)
Calcium: 9.2 mg/dL (ref 8.9–10.3)
Chloride: 95 mmol/L — ABNORMAL LOW (ref 98–111)
Creatinine, Ser: 1 mg/dL (ref 0.44–1.00)
GFR, Estimated: 54 mL/min — ABNORMAL LOW (ref >60)
Glucose, Bld: 188 mg/dL — ABNORMAL HIGH (ref 70–99)
Potassium: 4.4 mmol/L (ref 3.5–5.1)
Sodium: 131 mmol/L — ABNORMAL LOW (ref 135–145)

## 2023-07-21 MED ORDER — TRAZODONE HCL 50 MG PO TABS: 50.0000 mg | ORAL_TABLET | Freq: Every evening | ORAL | Status: DC | PRN

## 2023-07-21 MED ORDER — ASPIRIN 81 MG PO CHEW
81.0000 mg | CHEWABLE_TABLET | Freq: Every day | ORAL | Status: DC
  Administered 2023-07-21 – 2023-07-26 (×6): 81 mg via ORAL
  Filled 2023-07-21 (×6): qty 1

## 2023-07-21 MED ORDER — ENOXAPARIN SODIUM 30 MG/0.3ML IJ SOSY
30.0000 mg | PREFILLED_SYRINGE | INTRAMUSCULAR | Status: DC
  Administered 2023-07-22 – 2023-07-26 (×5): 30 mg via SUBCUTANEOUS
  Filled 2023-07-21 (×5): qty 0.3

## 2023-07-21 MED ORDER — ENOXAPARIN SODIUM 40 MG/0.4ML IJ SOSY: 40.0000 mg | PREFILLED_SYRINGE | INTRAMUSCULAR | 0 refills | Status: DC

## 2023-07-21 MED ORDER — TRAMADOL HCL 50 MG PO TABS: 100.0000 mg | ORAL_TABLET | Freq: Two times a day (BID) | ORAL | 0 refills | Status: DC

## 2023-07-21 MED ORDER — MORPHINE SULFATE (PF) 2 MG/ML IV SOLN: 2.0000 mg | INTRAVENOUS | Status: DC | PRN

## 2023-07-21 MED ORDER — ATORVASTATIN CALCIUM 10 MG PO TABS
10.0000 mg | ORAL_TABLET | Freq: Every day | ORAL | Status: DC
  Administered 2023-07-21 – 2023-07-26 (×6): 10 mg via ORAL
  Filled 2023-07-21 (×6): qty 1

## 2023-07-21 MED ORDER — VITAMIN D3 25 MCG (1000 UNIT) PO TABS
2000.0000 [IU] | ORAL_TABLET | Freq: Every day | ORAL | Status: DC
  Administered 2023-07-21 – 2023-07-26 (×6): 2000 [IU] via ORAL
  Filled 2023-07-21 (×12): qty 2

## 2023-07-21 MED ORDER — SIMETHICONE 80 MG PO CHEW
80.0000 mg | CHEWABLE_TABLET | Freq: Once | ORAL | Status: AC
  Administered 2023-07-21: 80 mg via ORAL
  Filled 2023-07-21: qty 1

## 2023-07-21 MED ORDER — HYDROCODONE-ACETAMINOPHEN 5-325 MG PO TABS: 1.0000 | ORAL_TABLET | ORAL | 0 refills | Status: DC | PRN

## 2023-07-21 MED ORDER — PANTOPRAZOLE SODIUM 40 MG PO TBEC
40.0000 mg | DELAYED_RELEASE_TABLET | Freq: Every day | ORAL | Status: DC
  Administered 2023-07-21 – 2023-07-26 (×6): 40 mg via ORAL
  Filled 2023-07-21 (×6): qty 1

## 2023-07-21 MED ORDER — HYDROCODONE-ACETAMINOPHEN 5-325 MG PO TABS
1.0000 | ORAL_TABLET | ORAL | Status: DC | PRN
  Administered 2023-07-21: 2 via ORAL
  Filled 2023-07-21: qty 2
  Filled 2023-07-21: qty 1

## 2023-07-21 NOTE — Progress Notes (Signed)
PROGRESS NOTE    Tracy Spears   JYN:[id number] DOB: 11-04-1932  DOA: 07/18/2023 Date of Service: 07/21/23 which is hospital day 2  PCP: Lauro Regulus, MD    HPI: Tracy BARTKIEWICZ is an active 89 y.o. female with a history of T2DM, HTN, hyponatremia, gout, nephrolithiasis who presented to the ED on 07/18/2023 after tripping on laundry at home, falling on right hip with significant pain.    Hospital course / significant events:  11/10: subtrochanteric right femur fracture.  11/11: fracture repair  11/12-11/13: awaiting SNF rehab placement   Consultants:  Orthopedics  Procedures/Surgeries: 07/19/23; IM nail R femur fracture repair       ASSESSMENT & PLAN:   Closed right subtrochanteric femur fracture from mechanical fall at home: s/p IM nail 11/11 by Dr. Rosann Auerbach.  Await ANF rehab placement  Continue multimodal pain regimen, attempting to limit narcotics.  Lovenox for VTE ppx, ok to continue unless uncontrolled bleeding develops.   Acute postoperative blood loss anemia:  Monitor CBC   Thrombocytopenia: Mild.  Monitor CBC   HTN Chronic HFpEF: Appears euvolemic HLD CXR with cardiomegaly (AP film), pulmonary vascular congestion though she has no dyspnea, hypoxia, or crackles on exam. She has no respiratory symptoms and is without dyspnea completely supine. Last echo May 2024 showed LVEF 60-65%, no RWMA, grade 2 diastolic dysfunction. Normal RV and no hemodynamically significant valvulopathy. Severe LAE. holding home losartan d/t AKI, low BP Holding home carvedilol d/t low BP Held torsemide given AKI, this can resume as needed  Aspirin Statin    AKI, resolved  Monitor BMP. Avoid nephrotoxins  Holding gabapentin and metformin     Nausea: Abd exam benign but at risk of constipation Start regular bowel regimen Continue prn antiemetics.    T2DM: HbA1c 7.1% indicating adequate control  Continue SSI   GERD Continue home PPI  Gout:  Quiescent.     Normal weight based on BMI: Body mass index is 21.87 kg/m.  Underweight - under 18.5  normal weight - 18.5 to 24.9 overweight - 25 to 29.9 obese - 30 or more   DVT prophylaxis: lovenox IV fluids: no continuous IV fluids  Nutrition: regular Central lines / invasive devices: none  Code Status: FULL CODE ACP documentation reviewed: 07/21/23 and none on file in [facility]  [facility] needs: SNF placement Barriers to dispo / significant pending items: placement for rehab              Subjective / Brief ROS:  Patient reports doing well today Denies CP/SOB.  Pain controlled.  Denies new weakness.  Tolerating diet.  Reports no concerns w/ urination/defecation.   Family Communication: husband at bedside on rounds     Objective Findings:  Vitals:   07/20/23 1640 07/20/23 2001 07/20/23 2348 07/21/23 0804  BP: (!) 142/63 (!) 118/50 136/61 (!) 96/57  Pulse: 70 74 72 71  Resp: 15 18 18 16   Temp:  99.1 F (37.3 C) 98.6 F (37 C) 98.1 F (36.7 C)  TempSrc:  Oral    SpO2: (!) 88% 94% 92% 96%  Weight:      Height:        Intake/Output Summary (Last 24 hours) at 07/21/2023 1450 Last data filed at 07/21/2023 0600 Gross per 24 hour  Intake --  Output 575 ml  Net -575 ml   Filed Weights   07/18/23 2354 07/19/23 1310  Weight: 50.8 kg 50.8 kg    Examination:  Physical Exam Constitutional:  General: She is not in acute distress. Cardiovascular:     Rate and Rhythm: Normal rate and regular rhythm.  Pulmonary:     Effort: Pulmonary effort is normal.     Breath sounds: Normal breath sounds.  Musculoskeletal:     Right lower leg: No edema.     Left lower leg: No edema.  Neurological:     General: No focal deficit present.     Mental Status: She is alert and oriented to person, place, and time. Mental status is at baseline.  Psychiatric:        Mood and Affect: Mood normal.        Behavior: Behavior normal.          Scheduled Medications:    aspirin  81 mg Oral Daily   atorvastatin  10 mg Oral Daily   cholecalciferol  2,000 Units Oral Daily   [START ON 07/22/2023] enoxaparin (LOVENOX) injection  30 mg Subcutaneous Q24H   feeding supplement (GLUCERNA SHAKE)  237 mL Oral TID BM   insulin aspart  0-9 Units Subcutaneous TID AC & HS   multivitamin with minerals  1 tablet Oral Daily   pantoprazole  40 mg Oral Daily   polyethylene glycol  17 g Oral Daily   senna-docusate  1 tablet Oral Daily   traMADol  100 mg Oral Q12H    Continuous Infusions:   PRN Medications:  acetaminophen, HYDROcodone-acetaminophen, menthol-cetylpyridinium **OR** phenol, methocarbamol **OR** methocarbamol (ROBAXIN) injection, metoCLOPramide **OR** metoCLOPramide (REGLAN) injection, ondansetron **OR** ondansetron (ZOFRAN) IV, traZODone  Antimicrobials from admission:  Anti-infectives (From admission, onward)    Start     Dose/Rate Route Frequency Ordered Stop   07/19/23 1445  ceFAZolin (ANCEF) IVPB 2g/100 mL premix        2 g 200 mL/hr over 30 Minutes Intravenous  Once 07/19/23 1435 07/19/23 1543   07/19/23 0930  ceFAZolin (ANCEF) IVPB 2g/100 mL premix        2 g 200 mL/hr over 30 Minutes Intravenous To Surgery 07/19/23 0835 07/19/23 1117   07/19/23 0915  ceFAZolin (ANCEF) powder 2 g  Status:  Discontinued        2 g Other To Surgery 07/19/23 4098 07/19/23 0834           Data Reviewed:  I have personally reviewed the following...  CBC: Recent Labs  Lab 07/19/23 0132 07/20/23 0433 07/21/23 0438  WBC 7.2 8.4 10.1  NEUTROABS 4.1  --   --   HGB 10.1* 8.2* 8.0*  HCT 30.9* 24.8* 23.4*  MCV 88.0 85.2 85.1  PLT 195 144* 149*   Basic Metabolic Panel: Recent Labs  Lab 07/19/23 0132 07/20/23 0433 07/21/23 0438  NA 133* 133* 131*  K 3.9 3.7 4.4  CL 99 101 95*  CO2 27 25 25   GLUCOSE 220* 137* 188*  BUN 29* 19 25*  CREATININE 1.11* 0.86 1.00  CALCIUM 9.1 8.9 9.2   GFR: Estimated Creatinine Clearance: 27.9 mL/min (by C-G formula  based on SCr of 1 mg/dL). Liver Function Tests: No results for input(s): "AST", "ALT", "ALKPHOS", "BILITOT", "PROT", "ALBUMIN" in the last 168 hours. No results for input(s): "LIPASE", "AMYLASE" in the last 168 hours. No results for input(s): "AMMONIA" in the last 168 hours. Coagulation Profile: No results for input(s): "INR", "PROTIME" in the last 168 hours. Cardiac Enzymes: No results for input(s): "CKTOTAL", "CKMB", "CKMBINDEX", "TROPONINI" in the last 168 hours. BNP (last 3 results) No results for input(s): "PROBNP" in the last 8760 hours. HbA1C: No  results for input(s): "HGBA1C" in the last 72 hours. CBG: Recent Labs  Lab 07/20/23 1635 07/20/23 2036 07/21/23 0755 07/21/23 0801 07/21/23 1142  GLUCAP 189* 183* 155* 164* 189*   Lipid Profile: No results for input(s): "CHOL", "HDL", "LDLCALC", "TRIG", "CHOLHDL", "LDLDIRECT" in the last 72 hours. Thyroid Function Tests: No results for input(s): "TSH", "T4TOTAL", "FREET4", "T3FREE", "THYROIDAB" in the last 72 hours. Anemia Panel: No results for input(s): "VITAMINB12", "FOLATE", "FERRITIN", "TIBC", "IRON", "RETICCTPCT" in the last 72 hours. Most Recent Urinalysis On File:  No results found for: "COLORURINE", "APPEARANCEUR", "LABSPEC", "PHURINE", "GLUCOSEU", "HGBUR", "BILIRUBINUR", "KETONESUR", "PROTEINUR", "UROBILINOGEN", "NITRITE", "LEUKOCYTESUR" Sepsis Labs: @LABRCNTIP (procalcitonin:4,lacticidven:4) Microbiology: No results found for this or any previous visit (from the past 240 hour(s)).    Radiology Studies last 3 days: DG HIP UNILAT WITH PELVIS 2-3 VIEWS RIGHT  Result Date: 07/19/2023 CLINICAL DATA:  Elective surgery. EXAM: DG HIP (WITH OR WITHOUT PELVIS) 2-3V RIGHT COMPARISON:  Preoperative imaging. FINDINGS: Five fluoroscopic spot views of the right hip and femur obtained in the operating room. Femoral intramedullary nail with trans trochanteric and distal locking screw fixation traversing comminuted proximal femur  fracture. Fluoroscopy time 2 minutes 28 seconds. Dose 19.54 mGy. IMPRESSION: Intraoperative fluoroscopy during proximal femur fracture fixation. Electronically Signed   By: Narda Rutherford M.D.   On: 07/19/2023 19:01   DG C-Arm 1-60 Min-No Report  Result Date: 07/19/2023 Fluoroscopy was utilized by the requesting physician.  No radiographic interpretation.   DG C-Arm 1-60 Min-No Report  Result Date: 07/19/2023 Fluoroscopy was utilized by the requesting physician.  No radiographic interpretation.   DG Chest Portable 1 View  Result Date: 07/19/2023 CLINICAL DATA:  Preop for hip fracture. EXAM: PORTABLE CHEST 1 VIEW COMPARISON:  01/20/2023 FINDINGS: Stable cardiomegaly. Aortic atherosclerotic calcification. Pulmonary vascular congestion. No focal consolidation, pleural effusion, or pneumothorax. No displaced rib fractures. IMPRESSION: Cardiomegaly with pulmonary vascular congestion, similar to prior. Electronically Signed   By: Minerva Fester M.D.   On: 07/19/2023 00:46   DG Hip Unilat W or Wo Pelvis 2-3 Views Right  Result Date: 07/19/2023 CLINICAL DATA:  Fall with right hip pain. EXAM: DG HIP (WITH OR WITHOUT PELVIS) 2-3V RIGHT COMPARISON:  12/13/2020 FINDINGS: Acute displaced fracture of the proximal right femur. The fracture line courses obliquely from the lesser trochanter inferolaterally into the femoral diaphysis. There is apex lateral angulation. No dislocation of the femoral head. Degenerative changes pubic symphysis, both hips, SI joints and lower lumbar spine. Demineralization. IMPRESSION: Acute displaced angulated fracture of the proximal right femur. Electronically Signed   By: Minerva Fester M.D.   On: 07/19/2023 00:45        Sunnie Nielsen, DO [facility] 07/21/2023, 2:50 PM    Dictation software may have been used to generate the above note. Typos may occur and escape review in typed/dictated notes. Please contact Dr Lyn Hollingshead directly for clarity if  needed.  Staff may message me via secure chat in Epic  but this may not receive an immediate response,  please page me for urgent matters!  If 7PM-7AM, please contact night coverage [email or web address]

## 2023-07-21 NOTE — Progress Notes (Signed)
PHARMACIST - PHYSICIAN COMMUNICATION  CONCERNING:  Enoxaparin (Lovenox) for DVT Prophylaxis    RECOMMENDATION: Patient was prescribed enoxaprin 40mg  q24 hours for VTE prophylaxis.   Filed Weights   07/18/23 2354 07/19/23 1310  Weight: 50.8 kg (112 lb) 50.8 kg (112 lb)    Body mass index is 21.87 kg/m.  Estimated Creatinine Clearance: 27.9 mL/min (by C-G formula based on SCr of 1 mg/dL).   Patient is candidate for enoxaparin 30mg  every 24 hours based on CrCl <6ml/min or Weight <45kg  DESCRIPTION: Pharmacy has adjusted enoxaparin dose per [facility] policy.  Patient is now receiving enoxaparin 30 mg every 24 hours    Merryl Hacker, PharmD Clinical Pharmacist  07/21/2023 9:25 AM

## 2023-07-21 NOTE — Plan of Care (Signed)

## 2023-07-21 NOTE — Progress Notes (Signed)
Occupational Therapy Treatment Patient Details Name: Tracy Spears MRN: [id number] DOB: 1933-02-15 Today's Date: 07/21/2023   History of present illness Patient is a 89 y.o. female with medical history significant for Diabetes, hypertension hyponatremia, gout, nephrolithiasis, independent in ADLs, being admitted with a right proximal femur fracture from an accidental fall. Current MD assessment: HTN, AKI, HLD, and R proximal femur fracture, now s/p R intramedullary nail.   OT comments  Pt in bed upon OT arrival, stating that she was just waking up, but agreeable to OT session.  BP 122/67 with HR 67 with pt in bed with HOB elevated; improved from reading of 96/57 at 8:04 am this morning, per chart.  Pt participatory in bed mobility with max A, limited by pain.  Pt had meds 1 hour prior to session.  Good tolerance for sitting EOB with no report of dizziness or lightheadedness.  Max A for sit to stand with vc for hand placement; good maintenance of 25% PWB through RLE throughout standing and transfer to chair.  Pt was instructed to attempt a small hop to maintain PWB through RLE during step pivot to chair, but pt did not feel she was able and slid toes/heel of L foot toward chair, with OT providing mod A and initiating movement of walker.  All positional changes required extensive time d/t pain, guarding, and ensuring PWB to RLE, but able to perform above with 1 person assist this date.  Left in chair with spouse present.  OT advised RN on pt's mobility status.  Will continue to follow in the acute setting to maximize indep with ADLs and functional mobility.  Continue to recommend SNF upon d/c.       If plan is discharge home, recommend the following:  A lot of help with walking and/or transfers;A lot of help with bathing/dressing/bathroom;Assistance with cooking/housework;Assist for transportation;Help with stairs or ramp for entrance   Equipment Recommendations  Other (comment);BSC/3in1 (defer to  next venue of care)    Recommendations for Other Services      Precautions / Restrictions Precautions Precautions: Fall Restrictions Weight Bearing Restrictions: Yes RLE Weight Bearing: Partial weight bearing RLE Partial Weight Bearing Percentage or Pounds: 25 %       Mobility Bed Mobility Overal bed mobility: Needs Assistance Bed Mobility: Supine to Sit     Supine to sit: HOB elevated, Max assist     General bed mobility comments: max A to move RLE towards EOB, max A to transition trunk d/t pt guarding d/t pain Patient Response: Cooperative  Transfers Overall transfer level: Needs assistance Equipment used: Rolling walker (2 wheels) Transfers: Sit to/from Stand Sit to Stand: Mod assist           General transfer comment: mod A sit to stand from EOB, mod A for step pivot to chair d/t pt unable to hop with L foot, instead scooted toes/heels without lifting LLE in order to maintain RLE 25% WB.     Balance Overall balance assessment: Needs assistance   Sitting balance-Leahy Scale: Fair     Standing balance support: Reliant on assistive device for balance, During functional activity, Bilateral upper extremity supported Standing balance-Leahy Scale: Poor Standing balance comment: tactile and vc for upright posture                           ADL either performed or assessed with clinical judgement   ADL Overall ADL's : Needs assistance/impaired  Functional mobility during ADLs: Moderate assistance General ADL Comments: based on EOB sitting, sit to stand transfer, and static standing balance, anticipate max A LB ADLs    Extremity/Trunk Assessment Upper Extremity Assessment Upper Extremity Assessment: Overall WFL for tasks assessed   Lower Extremity Assessment Lower Extremity Assessment: Defer to PT evaluation;RLE deficits/detail RLE Deficits / Details: S/p R hip IM nailing, WB precautions         Vision Baseline Vision/History: 1 Wears glasses Patient Visual Report: No change from baseline               Cognition Arousal: Alert Behavior During Therapy: WFL for tasks assessed/performed Overall Cognitive Status: Within Functional Limits for tasks assessed                                 General Comments: alert and oriented able to follow simple commands        Exercises Other Exercises Other Exercises: Educ on benefits of ankle pumps for promoting BLE circulation; encouraged 10-20 reps bilat hourly when awake with good return demo.    Shoulder Instructions       General Comments      Pertinent Vitals/ Pain       Pain Assessment Pain Score: 5  Pain Location: R hip Pain Descriptors / Indicators: Aching, Guarding Pain Intervention(s): Premedicated before session, Monitored during session, Repositioned, Limited activity within patient's tolerance  Home Living                                                        Frequency  Min 1X/week        Progress Toward Goals  OT Goals(current goals can now be found in the care plan section)  Progress towards OT goals: Progressing toward goals  Acute Rehab OT Goals Patient Stated Goal: get better OT Goal Formulation: With patient/family Time For Goal Achievement: 08/03/23 Potential to Achieve Goals: Good  Plan                       AM-PAC OT "6 Clicks" Daily Activity     Outcome Measure   Help from another person eating meals?: None Help from another person taking care of personal grooming?: A Little Help from another person toileting, which includes using toliet, bedpan, or urinal?: A Lot Help from another person bathing (including washing, rinsing, drying)?: A Lot Help from another person to put on and taking off regular upper body clothing?: A Little Help from another person to put on and taking off regular lower body clothing?: A Lot 6 Click Score: 16     End of Session Equipment Utilized During Treatment: Gait belt;Rolling walker (2 wheels)  OT Visit Diagnosis: Other abnormalities of gait and mobility (R26.89);History of falling (Z91.81);Muscle weakness (generalized) (M62.81);Pain Pain - Right/Left: Right Pain - part of body: Hip   Activity Tolerance Patient limited by pain   Patient Left in chair;with call bell/phone within reach;with family/visitor present   Nurse Communication Mobility status        Time: 1610-9604 OT Time Calculation (min): 36 min  Charges: OT General Charges $OT Visit: 1 Visit OT Treatments $Self Care/Home Management : 23-37 mins  Danelle Earthly, MS, OTR/L   Otis Dials 07/21/2023, 3:20  PM

## 2023-07-21 NOTE — Progress Notes (Signed)
  Subjective: 2 Days Post-Op Procedure(s) (LRB): INTRAMEDULLARY (IM) NAIL INTERTROCHANTERIC (Right) Patient reports pain as mild.   Patient is well, and has had no acute complaints or problems Plan is to go Rehab after hospital stay. Negative for chest pain and shortness of breath Fever: no Gastrointestinal: Negative for nausea and vomiting  Objective: Vital signs in last 24 hours: Temp:  [98 F (36.7 C)-99.1 F (37.3 C)] 98.6 F (37 C) (11/12 2348) Pulse Rate:  [64-74] 72 (11/12 2348) Resp:  [15-18] 18 (11/12 2348) BP: (118-142)/(50-81) 136/61 (11/12 2348) SpO2:  [88 %-94 %] 92 % (11/12 2348)  Intake/Output from previous day:  Intake/Output Summary (Last 24 hours) at 07/21/2023 0706 Last data filed at 07/21/2023 0600 Gross per 24 hour  Intake 720 ml  Output 575 ml  Net 145 ml    Intake/Output this shift: No intake/output data recorded.  Labs: Recent Labs    07/19/23 0132 07/20/23 0433 07/21/23 0438  HGB 10.1* 8.2* 8.0*   Recent Labs    07/20/23 0433 07/21/23 0438  WBC 8.4 10.1  RBC 2.91* 2.75*  HCT 24.8* 23.4*  PLT 144* 149*   Recent Labs    07/20/23 0433 07/21/23 0438  NA 133* 131*  K 3.7 4.4  CL 101 95*  CO2 25 25  BUN 19 25*  CREATININE 0.86 1.00  GLUCOSE 137* 188*  CALCIUM 8.9 9.2   No results for input(s): "LABPT", "INR" in the last 72 hours.   EXAM General - Patient is Alert and Oriented Extremity - Neurovascular intact Sensation intact distally Dorsiflexion/Plantar flexion intact Compartment soft Dressing/Incision - clean, dry, no drainage Motor Function - intact, moving foot and toes well on exam.  Ambulated 3 feet with physical therapy.  Past Medical History:  Diagnosis Date   Actinic keratosis    Basal cell carcinoma 04/07/2010   Right paranasal.    Diabetes mellitus without complication (HCC)     Assessment/Plan: 2 Days Post-Op Procedure(s) (LRB): INTRAMEDULLARY (IM) NAIL INTERTROCHANTERIC (Right) Principal Problem:    Hip fracture requiring operative repair, right, closed, initial encounter [facility]) Active Problems:   Essential hypertension   Uncontrolled type 2 diabetes mellitus with hyperglycemia, without long-term current use of insulin (HCC)   Closed  fracture of proximal femur, right, initial encounter (HCC)   AKI (acute kidney injury) (HCC)  Estimated body mass index is 21.87 kg/m as calculated from the following:   Height as of this encounter: 5' (1.524 m).   Weight as of this encounter: 50.8 kg. Advance diet Up with therapy  Acute blood loss anemia.  Post surgical hip fracture.  Hemoglobin 8.0.  Stable.  Discharge planning: Patient will follow-up at [facility] clinic orthopedics in 2 weeks for staple removal and x-rays of the right hip.  DVT Prophylaxis - Lovenox, Foot Pumps, and TED hose Weight-Bearing as tolerated to right leg  Dedra Skeens, PA-C Orthopaedic Surgery 07/21/2023, 7:06 AM

## 2023-07-21 NOTE — Progress Notes (Signed)
Physical Therapy Treatment Patient Details Name: Tracy Spears MRN: [id number] DOB: Jan 05, 1933 Today's Date: 07/21/2023   History of Present Illness Patient is a 89 y.o. female with medical history significant for Diabetes, hypertension hyponatremia, gout, nephrolithiasis, independent in ADLs, being admitted with a right proximal femur fracture from an accidental fall. Current MD assessment: HTN, AKI, HLD, and R proximal femur fracture, now s/p R intramedullary nail.    PT Comments  Pt was pleasant and motivated to participate during the session and put forth good effort throughout. Pt continues to successfully maintain RLE WB precautions throughout session, but is Mod A for x2 STS's and Min A for taking a few steps forwards and backwards with RW. Pt taking a few steps with min A to keep balance, VC's for step-to sequencing. Pt denies SOB or dizziness throughout session. RLE slightly stiff at start of session, but improved with repeated AROM. Educated pt on benefits of frequently mobilizing RLE when seated or in bed to maintain functional mobility. Pt will benefit from continued PT services upon discharge to safely address deficits listed in patient problem list for decreased caregiver assistance and eventual return to PLOF.    If plan is discharge home, recommend the following: A lot of help with walking and/or transfers;A lot of help with bathing/dressing/bathroom;Assist for transportation;Assistance with cooking/housework   Can travel by private vehicle        Equipment Recommendations  Other (comment) (TBD at next venue of care)    Recommendations for Other Services       Precautions / Restrictions Precautions Precautions: Fall Restrictions Weight Bearing Restrictions: Yes RLE Weight Bearing: Partial weight bearing RLE Partial Weight Bearing Percentage or Pounds: 25 %     Mobility  Bed Mobility               General bed mobility comments: Pt in chair at start/end of  session    Transfers Overall transfer level: Needs assistance Equipment used: Rolling walker (2 wheels) Transfers: Sit to/from Stand Sit to Stand: Mod assist           General transfer comment: x2 STS's from chair, with VC's for RLE WB precautions, hand placement, and sequencing.    Ambulation/Gait Ambulation/Gait assistance: Min assist   Assistive device: Rolling walker (2 wheels) Gait Pattern/deviations: Decreased step length - right, Decreased step length - left, Decreased weight shift to right, Decreased stance time - right, Step-to pattern Gait velocity: decreased     General Gait Details: Took a few steps forwards and backwards, having trouble lifting RLE today, ultimately sliding it forwards/bakcwards in sequencing with step-to pattern. Pt still able to maintain precautions when standing/taking steps. Steps very effortful   Stairs             Wheelchair Mobility     Tilt Bed    Modified Rankin (Stroke Patients Only)       Balance Overall balance assessment: Needs assistance   Sitting balance-Leahy Scale: Fair     Standing balance support: Reliant on assistive device for balance, During functional activity, Bilateral upper extremity supported Standing balance-Leahy Scale: Poor Standing balance comment: static standing with maintenence of RLE WB precautions                            Cognition Arousal: Alert Behavior During Therapy: WFL for tasks assessed/performed Overall Cognitive Status: Within Functional Limits for tasks assessed  Exercises Other Exercises Other Exercises: Education on frequent mobilization on RLE when seated or in bed in order to maintain funcitonal mobility of RLE    General Comments        Pertinent Vitals/Pain Pain Assessment Pain Assessment: Faces Faces Pain Scale: Hurts little more Pain Location: R hip Pain Descriptors / Indicators: Aching,  Guarding, Sore Pain Intervention(s): Monitored during session, Repositioned, Limited activity within patient's tolerance    Home Living                          Prior Function            PT Goals (current goals can now be found in the care plan section) Progress towards PT goals: Progressing toward goals    Frequency    7X/week      PT Plan      Co-evaluation              AM-PAC PT "6 Clicks" Mobility   Outcome Measure  Help needed turning from your back to your side while in a flat bed without using bedrails?: A Lot Help needed moving from lying on your back to sitting on the side of a flat bed without using bedrails?: A Lot Help needed moving to and from a bed to a chair (including a wheelchair)?: A Lot Help needed standing up from a chair using your arms (e.g., wheelchair or bedside chair)?: A Lot Help needed to walk in hospital room?: A Lot Help needed climbing 3-5 steps with a railing? : Total 6 Click Score: 11    End of Session Equipment Utilized During Treatment: Gait belt Activity Tolerance: Patient tolerated treatment well;Patient limited by pain Patient left: with family/visitor present;with call bell/phone within reach;in chair Nurse Communication: Mobility status (nursing notified of pt in chair w/o chair alarm present) PT Visit Diagnosis: Pain;Other abnormalities of gait and mobility (R26.89);Difficulty in walking, not elsewhere classified (R26.2);Muscle weakness (generalized) (M62.81) Pain - Right/Left: Right Pain - part of body: Hip     Time: 8657-8469 PT Time Calculation (min) (ACUTE ONLY): 27 min  Charges:                            Cecile Sheerer, SPT 07/21/23, 3:43 PM

## 2023-07-21 NOTE — Hospital Course (Addendum)
HPI: Tracy Spears is an active 89 y.o. female with a history of T2DM, HTN, hyponatremia, gout, nephrolithiasis who presented to the ED on 07/18/2023 after tripping on laundry at home, falling on right hip with significant pain.    Hospital course / significant events:  11/10: subtrochanteric right femur fracture.  11/11: fracture repair  11/12-11/17: awaiting SNF rehab placement   Consultants:  Orthopedics  Procedures/Surgeries: 07/19/23; IM nail R femur fracture repair       ASSESSMENT & PLAN:   Closed right subtrochanteric femur fracture from mechanical fall at home: s/p IM nail 11/11 by Dr. Rosann Auerbach.  Await SNF rehab placement  Continue multimodal pain regimen, attempting to limit narcotics.  Discharge planning: Patient will follow-up at [facility] clinic orthopedics in 2 weeks for staple removal and x-rays of the right hip. DVT Prophylaxis - Lovenox, Foot Pumps, and TED hose 25% Weight-Bearing to right leg   Acute postoperative blood loss anemia:  Monitor CBC   Thrombocytopenia: Mild.  Monitor CBC   HTN Chronic HFpEF: Appears euvolemic HLD CXR with cardiomegaly (AP film), pulmonary vascular congestion though she has no dyspnea, hypoxia, or crackles on exam. She has no respiratory symptoms and is without dyspnea completely supine. Last echo May 2024 showed LVEF 60-65%, no RWMA, grade 2 diastolic dysfunction. Normal RV and no hemodynamically significant valvulopathy. Severe LAE. holding home losartan d/t AKI, low BP Holding home carvedilol d/t low BP Held torsemide given AKI, this can resume as needed  Aspirin Statin    AKI, resolved  Monitor BMP. Avoid nephrotoxins  Holding gabapentin and metformin     Nausea: Abd exam benign but at risk of constipation Start regular bowel regimen Continue prn antiemetics.    T2DM: HbA1c 7.1% indicating adequate control  Continue SSI   GERD Continue home PPI  Gout: Quiescent.     Normal weight based on BMI: Body  mass index is 21.87 kg/m.  Underweight - under 18.5  normal weight - 18.5 to 24.9 overweight - 25 to 29.9 obese - 30 or more   DVT prophylaxis: lovenox IV fluids: no continuous IV fluids  Nutrition: regular Central lines / invasive devices: none  Code Status: FULL CODE ACP documentation reviewed: 07/21/23 and none on file in [facility]  [facility] needs: SNF placement Barriers to dispo / significant pending items: placement for rehab

## 2023-07-21 NOTE — Plan of Care (Signed)
  Problem: Coping: Goal: Ability to adjust to condition or change in health will improve Outcome: Progressing   Problem: Fluid Volume: Goal: Ability to maintain a balanced intake and output will improve Outcome: Progressing   Problem: Nutritional: Goal: Maintenance of adequate nutrition will improve Outcome: Progressing   Problem: Education: Goal: Knowledge of General Education information will improve Description: Including pain rating scale, medication(s)/side effects and non-pharmacologic comfort measures Outcome: Progressing

## 2023-07-21 NOTE — NC FL2 (Signed)
[place] MEDICAID FL2 LEVEL OF CARE FORM     IDENTIFICATION  Patient Name: Tracy Spears Birthdate: 02-14-1933 Sex: female Admission Date (Current Location): 07/18/2023  [facility] and [place] Number:  Chiropodist and Address:  [facility], [address] [place], [place] [address]      Provider Number: [id number]  Attending Physician Name and Address:  Sunnie Nielsen, DO  Relative Name and Phone Number:  Federica Michelli [phone]    Current Level of Care: Hospital Recommended Level of Care: Skilled Nursing Facility Prior Approval Number:    Date Approved/Denied:   PASRR Number: [id number]  Discharge Plan: SNF    Current Diagnoses: Patient Active Problem List   Diagnosis Date Noted   Uncontrolled type 2 diabetes mellitus with hyperglycemia, without long-term current use of insulin (HCC) 07/19/2023   Closed  fracture of proximal femur, right, initial encounter (HCC) 07/19/2023   AKI (acute kidney injury) (HCC) 07/19/2023   Hip fracture requiring operative repair, right, closed, initial encounter (HCC) 07/19/2023   Hyponatremia 01/20/2023   Essential hypertension 01/20/2023   Diabetes mellitus type 2, noninsulin dependent (HCC) 01/20/2023   Hyperlipidemia 01/20/2023   GERD (gastroesophageal reflux disease) 01/20/2023   Pericardial effusion 01/20/2023   Weakness 01/20/2023    Orientation RESPIRATION BLADDER Height & Weight     Self, Time, Situation  Normal Continent Weight: 50.8 kg Height:  5' (152.4 cm)  BEHAVIORAL SYMPTOMS/MOOD NEUROLOGICAL BOWEL NUTRITION STATUS      Continent  (See Discharge Summary)  AMBULATORY STATUS COMMUNICATION OF NEEDS Skin   Extensive Assist Verbally Normal                       Personal Care Assistance Level of Assistance  Bathing, Feeding, Dressing Bathing Assistance: Limited assistance Feeding assistance: Limited assistance Dressing Assistance: Maximum assistance     Functional  Limitations Info  Sight, Hearing, Speech Sight Info: Adequate Hearing Info: Adequate Speech Info: Adequate    SPECIAL CARE FACTORS FREQUENCY  PT (By licensed PT), OT (By licensed OT)     PT Frequency: 5x weekly OT Frequency: 5x weekly            Contractures Contractures Info: Not present    Additional Factors Info  Code Status, Allergies Code Status Info: Full Code Allergies Info: Lisinopril           Current Medications (07/21/2023):  This is the current hospital active medication list Current Facility-Administered Medications  Medication Dose Route Frequency Provider Last Rate Last Admin   acetaminophen (TYLENOL) tablet 325-650 mg  325-650 mg Oral Q6H PRN Cecil Cranker, MD       aspirin chewable tablet 81 mg  81 mg Oral Daily Sunnie Nielsen, DO   81 mg at 07/21/23 0906   atorvastatin (LIPITOR) tablet 10 mg  10 mg Oral Daily Sunnie Nielsen, DO   10 mg at 07/21/23 5643   cholecalciferol (VITAMIN D3) tablet 2,000 Units  2,000 Units Oral Daily Sunnie Nielsen, DO   2,000 Units at 07/21/23 0906   [START ON 07/22/2023] enoxaparin (LOVENOX) injection 30 mg  30 mg Subcutaneous Q24H Hunt, Madison H, RPH       feeding supplement (GLUCERNA SHAKE) (GLUCERNA SHAKE) liquid 237 mL  237 mL Oral TID BM Cecil Cranker, MD   237 mL at 07/21/23 1440   HYDROcodone-acetaminophen (NORCO/VICODIN) 5-325 MG per tablet 1-2 tablet  1-2 tablet Oral Q4H PRN Cecil Cranker, MD   1 tablet at 07/21/23  0617   insulin aspart (novoLOG) injection 0-9 Units  0-9 Units Subcutaneous TID AC & HS Jawo, Modou L, NP   2 Units at 07/21/23 1209   menthol-cetylpyridinium (CEPACOL) lozenge 3 mg  1 lozenge Oral PRN Cecil Cranker, MD       Or   phenol (CHLORASEPTIC) mouth spray 1 spray  1 spray Mouth/Throat PRN Cecil Cranker, MD       methocarbamol (ROBAXIN) tablet 500 mg  500 mg Oral Q6H PRN Cecil Cranker, MD       Or   methocarbamol (ROBAXIN)  injection 500 mg  500 mg Intravenous Q6H PRN Cecil Cranker, MD   500 mg at 07/19/23 0855   metoCLOPramide (REGLAN) tablet 5-10 mg  5-10 mg Oral Q8H PRN Cecil Cranker, MD       Or   metoCLOPramide (REGLAN) injection 5-10 mg  5-10 mg Intravenous Q8H PRN Cecil Cranker, MD       multivitamin with minerals tablet 1 tablet  1 tablet Oral Daily Cecil Cranker, MD   1 tablet at 07/21/23 0906   ondansetron (ZOFRAN) tablet 4 mg  4 mg Oral Q6H PRN Cecil Cranker, MD       Or   ondansetron [facility]) injection 4 mg  4 mg Intravenous Q6H PRN Cecil Cranker, MD   4 mg at 07/20/23 0953   pantoprazole (PROTONIX) EC tablet 40 mg  40 mg Oral Daily Sunnie Nielsen, DO   40 mg at 07/21/23 7829   polyethylene glycol (MIRALAX / GLYCOLAX) packet 17 g  17 g Oral Daily Tyrone Nine, MD   17 g at 07/21/23 0919   senna-docusate (Senokot-S) tablet 1 tablet  1 tablet Oral Daily Tyrone Nine, MD   1 tablet at 07/21/23 0906   traMADol (ULTRAM) tablet 100 mg  100 mg Oral Q12H Cecil Cranker, MD   100 mg at 07/21/23 5621   traZODone (DESYREL) tablet 50 mg  50 mg Oral QHS PRN Sunnie Nielsen, DO         Discharge Medications: Please see discharge summary for a list of discharge medications.  Relevant Imaging Results:  Relevant Lab Results:   Additional Information [id number]  Garret Reddish, RN

## 2023-07-21 NOTE — Plan of Care (Signed)
  Problem: Fluid Volume: Goal: Ability to maintain a balanced intake and output will improve Outcome: Progressing   Problem: Health Behavior/Discharge Planning: Goal: Ability to identify and utilize available resources and services will improve Outcome: Progressing Goal: Ability to manage health-related needs will improve Outcome: Progressing   Problem: Metabolic: Goal: Ability to maintain appropriate glucose levels will improve Outcome: Progressing

## 2023-07-22 DIAGNOSIS — S72001A Fracture of unspecified part of neck of right femur, initial encounter for closed fracture: Secondary | ICD-10-CM | POA: Diagnosis not present

## 2023-07-22 LAB — GLUCOSE, CAPILLARY
Glucose-Capillary: 131 mg/dL — ABNORMAL HIGH (ref 70–99)
Glucose-Capillary: 191 mg/dL — ABNORMAL HIGH (ref 70–99)
Glucose-Capillary: 184 mg/dL — ABNORMAL HIGH (ref 70–99)
Glucose-Capillary: 194 mg/dL — ABNORMAL HIGH (ref 70–99)

## 2023-07-22 NOTE — Plan of Care (Signed)
  Problem: Metabolic: Goal: Ability to maintain appropriate glucose levels will improve Outcome: Progressing   

## 2023-07-22 NOTE — Progress Notes (Signed)
PROGRESS NOTE    Tracy Spears   WJX:[id number] DOB: 03/31/1933  DOA: 07/18/2023 Date of Service: 07/22/23 which is hospital day 3  PCP: Lauro Regulus, MD    HPI: Tracy Spears is an active 89 y.o. female with a history of T2DM, HTN, hyponatremia, gout, nephrolithiasis who presented to the ED on 07/18/2023 after tripping on laundry at home, falling on right hip with significant pain.    Hospital course / significant events:  11/10: subtrochanteric right femur fracture.  11/11: fracture repair  11/12-11/14: awaiting SNF rehab placement   Consultants:  Orthopedics  Procedures/Surgeries: 07/19/23; IM nail R femur fracture repair       ASSESSMENT & PLAN:   Closed right subtrochanteric femur fracture from mechanical fall at home: s/p IM nail 11/11 by Dr. Rosann Auerbach.  Await ANF rehab placement  Continue multimodal pain regimen, attempting to limit narcotics.  Discharge planning: Patient will follow-up at [facility] clinic orthopedics in 2 weeks for staple removal and x-rays of the right hip. DVT Prophylaxis - Lovenox, Foot Pumps, and TED hose 25% Weight-Bearing to right leg   Acute postoperative blood loss anemia:  Monitor CBC   Thrombocytopenia: Mild.  Monitor CBC   HTN Chronic HFpEF: Appears euvolemic HLD CXR with cardiomegaly (AP film), pulmonary vascular congestion though she has no dyspnea, hypoxia, or crackles on exam. She has no respiratory symptoms and is without dyspnea completely supine. Last echo May 2024 showed LVEF 60-65%, no RWMA, grade 2 diastolic dysfunction. Normal RV and no hemodynamically significant valvulopathy. Severe LAE. holding home losartan d/t AKI, low BP Holding home carvedilol d/t low BP Held torsemide given AKI, this can resume as needed  Aspirin Statin    AKI, resolved  Monitor BMP. Avoid nephrotoxins  Holding gabapentin and metformin     Nausea: Abd exam benign but at risk of constipation Start regular bowel  regimen Continue prn antiemetics.    T2DM: HbA1c 7.1% indicating adequate control  Continue SSI   GERD Continue home PPI  Gout: Quiescent.     Normal weight based on BMI: Body mass index is 21.87 kg/m.  Underweight - under 18.5  normal weight - 18.5 to 24.9 overweight - 25 to 29.9 obese - 30 or more   DVT prophylaxis: lovenox IV fluids: no continuous IV fluids  Nutrition: regular Central lines / invasive devices: none  Code Status: FULL CODE ACP documentation reviewed: 07/21/23 and none on file in [facility]  [facility] needs: SNF placement Barriers to dispo / significant pending items: placement for rehab              Subjective / Brief ROS:  Patient reports doing well today Denies CP/SOB.  Pain controlled.    Family Communication: husband at bedside on rounds     Objective Findings:  Vitals:   07/21/23 1510 07/21/23 2328 07/22/23 0800 07/22/23 1428  BP: (!) 127/52 (!) 105/57 (!) 118/54 122/67  Pulse: 68 70 74 73  Resp: 16 18 14 14   Temp: (!) 97.2 F (36.2 C) 98 F (36.7 C) 98.3 F (36.8 C) (!) 97.5 F (36.4 C)  TempSrc:   Oral Oral  SpO2: 95% 93% 92% 95%  Weight:      Height:        Intake/Output Summary (Last 24 hours) at 07/22/2023 1512 Last data filed at 07/22/2023 0900 Gross per 24 hour  Intake 120 ml  Output 400 ml  Net -280 ml   Filed Weights   07/18/23 2354 07/19/23 1310  Weight:  50.8 kg 50.8 kg    Examination:  Physical Exam Constitutional:      General: She is not in acute distress. Cardiovascular:     Rate and Rhythm: Normal rate and regular rhythm.  Pulmonary:     Effort: Pulmonary effort is normal.     Breath sounds: Normal breath sounds.  Musculoskeletal:     Right lower leg: No edema.     Left lower leg: No edema.  Neurological:     General: No focal deficit present.     Mental Status: She is alert and oriented to person, place, and time. Mental status is at baseline.  Psychiatric:        Mood and Affect: Mood  normal.        Behavior: Behavior normal.          Scheduled Medications:   aspirin  81 mg Oral Daily   atorvastatin  10 mg Oral Daily   cholecalciferol  2,000 Units Oral Daily   enoxaparin (LOVENOX) injection  30 mg Subcutaneous Q24H   feeding supplement (GLUCERNA SHAKE)  237 mL Oral TID BM   insulin aspart  0-9 Units Subcutaneous TID AC & HS   multivitamin with minerals  1 tablet Oral Daily   pantoprazole  40 mg Oral Daily   polyethylene glycol  17 g Oral Daily   senna-docusate  1 tablet Oral Daily   traMADol  100 mg Oral Q12H    Continuous Infusions:   PRN Medications:  acetaminophen, HYDROcodone-acetaminophen, menthol-cetylpyridinium **OR** phenol, methocarbamol **OR** methocarbamol (ROBAXIN) injection, metoCLOPramide **OR** metoCLOPramide (REGLAN) injection, morphine injection, ondansetron **OR** ondansetron (ZOFRAN) IV, traZODone  Antimicrobials from admission:  Anti-infectives (From admission, onward)    Start     Dose/Rate Route Frequency Ordered Stop   07/19/23 1445  ceFAZolin (ANCEF) IVPB 2g/100 mL premix        2 g 200 mL/hr over 30 Minutes Intravenous  Once 07/19/23 1435 07/19/23 1543   07/19/23 0930  ceFAZolin (ANCEF) IVPB 2g/100 mL premix        2 g 200 mL/hr over 30 Minutes Intravenous To Surgery 07/19/23 0835 07/19/23 1117   07/19/23 0915  ceFAZolin (ANCEF) powder 2 g  Status:  Discontinued        2 g Other To Surgery 07/19/23 8413 07/19/23 0834           Data Reviewed:  I have personally reviewed the following...  CBC: Recent Labs  Lab 07/19/23 0132 07/20/23 0433 07/21/23 0438  WBC 7.2 8.4 10.1  NEUTROABS 4.1  --   --   HGB 10.1* 8.2* 8.0*  HCT 30.9* 24.8* 23.4*  MCV 88.0 85.2 85.1  PLT 195 144* 149*   Basic Metabolic Panel: Recent Labs  Lab 07/19/23 0132 07/20/23 0433 07/21/23 0438  NA 133* 133* 131*  K 3.9 3.7 4.4  CL 99 101 95*  CO2 27 25 25   GLUCOSE 220* 137* 188*  BUN 29* 19 25*  CREATININE 1.11* 0.86 1.00  CALCIUM  9.1 8.9 9.2   GFR: Estimated Creatinine Clearance: 27.9 mL/min (by C-G formula based on SCr of 1 mg/dL). Liver Function Tests: No results for input(s): "AST", "ALT", "ALKPHOS", "BILITOT", "PROT", "ALBUMIN" in the last 168 hours. No results for input(s): "LIPASE", "AMYLASE" in the last 168 hours. No results for input(s): "AMMONIA" in the last 168 hours. Coagulation Profile: No results for input(s): "INR", "PROTIME" in the last 168 hours. Cardiac Enzymes: No results for input(s): "CKTOTAL", "CKMB", "CKMBINDEX", "TROPONINI" in the last 168 hours.  BNP (last 3 results) No results for input(s): "PROBNP" in the last 8760 hours. HbA1C: No results for input(s): "HGBA1C" in the last 72 hours. CBG: Recent Labs  Lab 07/21/23 1142 07/21/23 1629 07/21/23 2152 07/22/23 0758 07/22/23 1134  GLUCAP 189* 181* 166* 131* 191*   Lipid Profile: No results for input(s): "CHOL", "HDL", "LDLCALC", "TRIG", "CHOLHDL", "LDLDIRECT" in the last 72 hours. Thyroid Function Tests: No results for input(s): "TSH", "T4TOTAL", "FREET4", "T3FREE", "THYROIDAB" in the last 72 hours. Anemia Panel: No results for input(s): "VITAMINB12", "FOLATE", "FERRITIN", "TIBC", "IRON", "RETICCTPCT" in the last 72 hours. Most Recent Urinalysis On File:  No results found for: "COLORURINE", "APPEARANCEUR", "LABSPEC", "PHURINE", "GLUCOSEU", "HGBUR", "BILIRUBINUR", "KETONESUR", "PROTEINUR", "UROBILINOGEN", "NITRITE", "LEUKOCYTESUR" Sepsis Labs: @LABRCNTIP (procalcitonin:4,lacticidven:4) Microbiology: No results found for this or any previous visit (from the past 240 hour(s)).    Radiology Studies last 3 days: DG HIP UNILAT WITH PELVIS 2-3 VIEWS RIGHT  Result Date: 07/19/2023 CLINICAL DATA:  Elective surgery. EXAM: DG HIP (WITH OR WITHOUT PELVIS) 2-3V RIGHT COMPARISON:  Preoperative imaging. FINDINGS: Five fluoroscopic spot views of the right hip and femur obtained in the operating room. Femoral intramedullary nail with trans  trochanteric and distal locking screw fixation traversing comminuted proximal femur fracture. Fluoroscopy time 2 minutes 28 seconds. Dose 19.54 mGy. IMPRESSION: Intraoperative fluoroscopy during proximal femur fracture fixation. Electronically Signed   By: Narda Rutherford M.D.   On: 07/19/2023 19:01   DG C-Arm 1-60 Min-No Report  Result Date: 07/19/2023 Fluoroscopy was utilized by the requesting physician.  No radiographic interpretation.   DG C-Arm 1-60 Min-No Report  Result Date: 07/19/2023 Fluoroscopy was utilized by the requesting physician.  No radiographic interpretation.   DG Chest Portable 1 View  Result Date: 07/19/2023 CLINICAL DATA:  Preop for hip fracture. EXAM: PORTABLE CHEST 1 VIEW COMPARISON:  01/20/2023 FINDINGS: Stable cardiomegaly. Aortic atherosclerotic calcification. Pulmonary vascular congestion. No focal consolidation, pleural effusion, or pneumothorax. No displaced rib fractures. IMPRESSION: Cardiomegaly with pulmonary vascular congestion, similar to prior. Electronically Signed   By: Minerva Fester M.D.   On: 07/19/2023 00:46   DG Hip Unilat W or Wo Pelvis 2-3 Views Right  Result Date: 07/19/2023 CLINICAL DATA:  Fall with right hip pain. EXAM: DG HIP (WITH OR WITHOUT PELVIS) 2-3V RIGHT COMPARISON:  12/13/2020 FINDINGS: Acute displaced fracture of the proximal right femur. The fracture line courses obliquely from the lesser trochanter inferolaterally into the femoral diaphysis. There is apex lateral angulation. No dislocation of the femoral head. Degenerative changes pubic symphysis, both hips, SI joints and lower lumbar spine. Demineralization. IMPRESSION: Acute displaced angulated fracture of the proximal right femur. Electronically Signed   By: Minerva Fester M.D.   On: 07/19/2023 00:45        Sunnie Nielsen, DO [facility] 07/22/2023, 3:12 PM    Dictation software may have been used to generate the above note. Typos may occur and escape review  in typed/dictated notes. Please contact Dr Lyn Hollingshead directly for clarity if needed.  Staff may message me via secure chat in Epic  but this may not receive an immediate response,  please page me for urgent matters!  If 7PM-7AM, please contact night coverage [email or web address]

## 2023-07-22 NOTE — Progress Notes (Signed)
Occupational Therapy Treatment Patient Details Name: Tracy Spears MRN: [id number] DOB: 1933-08-06 Today's Date: 07/22/2023   History of present illness Patient is a 89 y.o. female with medical history significant for Diabetes, hypertension hyponatremia, gout, nephrolithiasis, independent in ADLs, being admitted with a right proximal femur fracture from an accidental fall. Current MD assessment: HTN, AKI, HLD, and R proximal femur fracture, now s/p R intramedullary nail.   OT comments  Pt. was up in the recliner visiting with her husband upon arrival. Pt. reports having had left heel pain earlier in the day, however reports no pain during the session. Pt. was assisted with floating her bilateral heels. Pt./caregiver education was provided about positioning to decrease pressure. Pt. education was provided about A/E use for LE ADLs. Pt. performed LE dressing skills using A/E with mod-maxA. Pt. would benefit from additional A/E training. Pt. continues to benefit from OT services for ADL training, A/E training, and pt. education about home modification, and DME. OT discharge recommendations remain appropriate at this time.       If plan is discharge home, recommend the following:  A lot of help with walking and/or transfers;A lot of help with bathing/dressing/bathroom;Assistance with cooking/housework;Assist for transportation;Help with stairs or ramp for entrance   Equipment Recommendations       Recommendations for Other Services      Precautions / Restrictions Precautions Precautions: Fall Restrictions Weight Bearing Restrictions: Yes RUE Weight Bearing: Partial weight bearing RUE Partial Weight Bearing Percentage or Pounds: 25% on RLE RLE Weight Bearing: Partial weight bearing RLE Partial Weight Bearing Percentage or Pounds: 25 %       Mobility Bed Mobility    Pt. Up in recliner chair  visiting with husband upon arrival.                Transfers                          Balance                                           ADL either performed or assessed with clinical judgement   ADL  Mod-MaxA LE ADLs with A/E use                                            Extremity/Trunk Assessment Upper Extremity Assessment Upper Extremity Assessment: Overall WFL for tasks assessed            Vision Baseline Vision/History: 1 Wears glasses     Perception     Praxis      Cognition Arousal: Alert Behavior During Therapy: [facility] for tasks assessed/performed                                            Exercises      Shoulder Instructions       General Comments      Pertinent Vitals/ Pain       Pain Assessment Pain Score: 0-No pain  Home Living  Prior Functioning/Environment              Frequency  Min 1X/week        Progress Toward Goals  OT Goals(current goals can now be found in the care plan section)  Progress towards OT goals: Progressing toward goals  Acute Rehab OT Goals Patient Stated Goal: To get better OT Goal Formulation: With patient/family Time For Goal Achievement: 08/03/23 Potential to Achieve Goals: Good  Plan      Co-evaluation                 AM-PAC OT "6 Clicks" Daily Activity     Outcome Measure   Help from another person eating meals?: None Help from another person taking care of personal grooming?: A Little Help from another person toileting, which includes using toliet, bedpan, or urinal?: A Lot Help from another person bathing (including washing, rinsing, drying)?: A Lot Help from another person to put on and taking off regular upper body clothing?: A Little Help from another person to put on and taking off regular lower body clothing?: A Lot 6 Click Score: 16    End of Session Equipment Utilized During Treatment: Gait belt;Rolling walker (2 wheels)  OT Visit  Diagnosis: Other abnormalities of gait and mobility (R26.89);History of falling (Z91.81);Muscle weakness (generalized) (M62.81);Pain Pain - Right/Left: Right Pain - part of body: Hip   Activity Tolerance Patient limited by pain   Patient Left in chair;with call bell/phone within reach;with family/visitor present   Nurse Communication          Time: 8657-8469 OT Time Calculation (min): 23 min  Charges: OT General Charges $OT Visit: 1 Visit OT Treatments $Self Care/Home Management : 8-22 mins  [name], MS, OTR/L   [name] 07/22/2023, 4:36 PM

## 2023-07-22 NOTE — Care Management Important Message (Signed)
Important Message  Patient Details  Name: Tracy Spears MRN: [id number] Date of Birth: 1932-09-20   Important Message Given:  N/A - LOS <3 / Initial given by admissions     [name] A Carlyne Keehan 07/22/2023, 9:06 AM

## 2023-07-22 NOTE — TOC Progression Note (Signed)
Transition of Care [facility]) - Progression Note    Patient Details  Name: Tracy Spears MRN: [id number] Date of Birth: May 18, 1933  [facility] of [facility]) CM/SW Contact  Garret Reddish, RN Phone Number: 07/22/2023, 3:46 PM  Clinical Narrative:    I have reviewed bed offers with patient and her husband.  Patient and her husband have chosen to go to [facility].    I have informed Tammy, Admissions Coordinator at [facility] and she reports that she will have a bed for [name] pending SNF authorization approval.  I have called SNF authorization to [facility] with [facility].  TOC will continue to follow for discharge planning.          Expected Discharge Plan and Services                                               Social Determinants of Health (SDOH) Interventions SDOH Screenings   Food Insecurity: No Food Insecurity (01/21/2023)  Housing: Low Risk  (01/21/2023)  Transportation Needs: No Transportation Needs (01/21/2023)  Utilities: Not At Risk (01/21/2023)  Tobacco Use: Low Risk  (07/19/2023)    Readmission Risk Interventions     No data to display

## 2023-07-22 NOTE — Progress Notes (Signed)
  Subjective: 3 Days Post-Op Procedure(s) (LRB): INTRAMEDULLARY (IM) NAIL INTERTROCHANTERIC (Right) Patient reports pain as mild.   Patient is well, and has had no acute complaints or problems Plan is to go Rehab after hospital stay. Negative for chest pain and shortness of breath Fever: no Gastrointestinal: Negative for nausea and vomiting  Objective: Vital signs in last 24 hours: Temp:  [97.2 F (36.2 C)-98.1 F (36.7 C)] 98 F (36.7 C) (11/13 2328) Pulse Rate:  [68-71] 70 (11/13 2328) Resp:  [16-18] 18 (11/13 2328) BP: (96-127)/(52-57) 105/57 (11/13 2328) SpO2:  [93 %-96 %] 93 % (11/13 2328)  Intake/Output from previous day:  Intake/Output Summary (Last 24 hours) at 07/22/2023 0711 Last data filed at 07/22/2023 0619 Gross per 24 hour  Intake 240 ml  Output 400 ml  Net -160 ml    Intake/Output this shift: No intake/output data recorded.  Labs: Recent Labs    07/20/23 0433 07/21/23 0438  HGB 8.2* 8.0*   Recent Labs    07/20/23 0433 07/21/23 0438  WBC 8.4 10.1  RBC 2.91* 2.75*  HCT 24.8* 23.4*  PLT 144* 149*   Recent Labs    07/20/23 0433 07/21/23 0438  NA 133* 131*  K 3.7 4.4  CL 101 95*  CO2 25 25  BUN 19 25*  CREATININE 0.86 1.00  GLUCOSE 137* 188*  CALCIUM 8.9 9.2   No results for input(s): "LABPT", "INR" in the last 72 hours.   EXAM General - Patient is Alert and Oriented Extremity - Neurovascular intact Sensation intact distally Dorsiflexion/Plantar flexion intact Compartment soft Dressing/Incision - clean, dry, no drainage Motor Function - intact, moving foot and toes well on exam.  Ambulated 3 feet with physical therapy.  Sat up in the chair.  Past Medical History:  Diagnosis Date   Actinic keratosis    Basal cell carcinoma 04/07/2010   Right paranasal.    Diabetes mellitus without complication (HCC)     Assessment/Plan: 3 Days Post-Op Procedure(s) (LRB): INTRAMEDULLARY (IM) NAIL INTERTROCHANTERIC (Right) Principal  Problem:   Hip fracture requiring operative repair, right, closed, initial encounter [facility]) Active Problems:   Essential hypertension   Uncontrolled type 2 diabetes mellitus with hyperglycemia, without long-term current use of insulin (HCC)   Closed  fracture of proximal femur, right, initial encounter (HCC)   AKI (acute kidney injury) (HCC)  Estimated body mass index is 21.87 kg/m as calculated from the following:   Height as of this encounter: 5' (1.524 m).   Weight as of this encounter: 50.8 kg. Advance diet Up with therapy  Acute blood loss anemia.  Post surgical hip fracture.  Hemoglobin 8.0.  Stable.  Discharge planning: Patient will follow-up at [facility] clinic orthopedics in 2 weeks for staple removal and x-rays of the right hip.  DVT Prophylaxis - Lovenox, Foot Pumps, and TED hose 25% Weight-Bearing to right leg  Dedra Skeens, PA-C Orthopaedic Surgery 07/22/2023, 7:11 AM

## 2023-07-22 NOTE — Plan of Care (Signed)

## 2023-07-22 NOTE — Progress Notes (Signed)
Physical Therapy Treatment Patient Details Name: Tracy Spears MRN: [id number] DOB: 1933/07/19 Today's Date: 07/22/2023   History of Present Illness Patient is a 89 y.o. female with medical history significant for Diabetes, hypertension hyponatremia, gout, nephrolithiasis, independent in ADLs, being admitted with a right proximal femur fracture from an accidental fall. Current MD assessment: HTN, AKI, HLD, and R proximal femur fracture, now s/p R intramedullary nail.    PT Comments  Pt is alert, trying to order her breakfast, however having slow processing and required assistance. Supportive spouse present throughout. Pt was able to correctly state PWB restrictions. She requires extensive max assistance to achieve EOB sitting. Stood 2 x EOB prior to transferring to recliner. Pt is very slow moving but did surprisingly well with PWB 25% on RLE. PT will continue to follow and progress per current POC. Pt will greatly benefit from continued skilled PT to maximize her independence and safety with all ADLs.      If plan is discharge home, recommend the following: A lot of help with walking and/or transfers;A lot of help with bathing/dressing/bathroom;Assist for transportation;Assistance with cooking/housework     Equipment Recommendations  Other (comment) (Defer to next level of care)       Precautions / Restrictions Precautions Precautions: Fall Restrictions Weight Bearing Restrictions: Yes RLE Weight Bearing: Partial weight bearing RLE Partial Weight Bearing Percentage or Pounds: 25 %     Mobility  Bed Mobility Overal bed mobility: Needs Assistance Bed Mobility: Supine to Sit  Supine to sit: HOB elevated, Max assist  General bed mobility comments: Max assist to achieve EOB sitting. VCs and increased time required    Transfers Overall transfer level: Needs assistance Equipment used: Rolling walker (2 wheels) Transfers: Sit to/from Stand Sit to Stand: Min assist, Mod assist, From  elevated surface  General transfer comment: pt stood EOB 2 x prior to stand pivot to recliner. Pt does well with PWB but requires extensive amount of time to perform    Ambulation/Gait Ambulation/Gait assistance: Min assist Gait Distance (Feet): 2 Feet Assistive device: Rolling walker (2 wheels) Gait velocity: decreased  General Gait Details: pt was able to pivot to recliner on LLE with min assist. more assistance required to achieve standing versus step/pivoting to recliner    Balance Overall balance assessment: Needs assistance Sitting-balance support: Bilateral upper extremity supported, Feet supported Sitting balance-Leahy Scale: Fair     Standing balance support: Reliant on assistive device for balance, During functional activity, Bilateral upper extremity supported Standing balance-Leahy Scale: Fair Standing balance comment: reliant on RW with BUE support for all standing activity     Cognition Arousal: Alert Behavior During Therapy: WFL for tasks assessed/performed Overall Cognitive Status: Within Functional Limits for tasks assessed      General Comments: alert and oriented able to follow simple commands               Pertinent Vitals/Pain Pain Assessment Pain Assessment: 0-10 Pain Score: 2  Pain Location: R hip Pain Descriptors / Indicators: Aching, Guarding, Sore Pain Intervention(s): Limited activity within patient's tolerance, Monitored during session, Premedicated before session, Repositioned     PT Goals (current goals can now be found in the care plan section) Acute Rehab PT Goals Patient Stated Goal: get better Progress towards PT goals: Progressing toward goals    Frequency    7X/week       AM-PAC PT "6 Clicks" Mobility   Outcome Measure  Help needed turning from your back to your side while in  a flat bed without using bedrails?: A Lot Help needed moving from lying on your back to sitting on the side of a flat bed without using bedrails?:  A Lot Help needed moving to and from a bed to a chair (including a wheelchair)?: A Lot Help needed standing up from a chair using your arms (e.g., wheelchair or bedside chair)?: A Lot Help needed to walk in hospital room?: A Lot Help needed climbing 3-5 steps with a railing? : Total 6 Click Score: 11    End of Session Equipment Utilized During Treatment: Gait belt Activity Tolerance: Patient tolerated treatment well Patient left: in chair;with call bell/phone within reach;with chair alarm set Nurse Communication: Mobility status PT Visit Diagnosis: Pain;Other abnormalities of gait and mobility (R26.89);Difficulty in walking, not elsewhere classified (R26.2);Muscle weakness (generalized) (M62.81) Pain - Right/Left: Right Pain - part of body: Hip     Time: 0750-0820 PT Time Calculation (min) (ACUTE ONLY): 30 min  Charges:    $Therapeutic Exercise: 8-22 mins $Therapeutic Activity: 8-22 mins PT General Charges $$ ACUTE PT VISIT: 1 Visit                    Jetta Lout PTA 07/22/23, 9:16 AM

## 2023-07-23 DIAGNOSIS — S72001A Fracture of unspecified part of neck of right femur, initial encounter for closed fracture: Secondary | ICD-10-CM | POA: Diagnosis not present

## 2023-07-23 LAB — GLUCOSE, CAPILLARY
Glucose-Capillary: 151 mg/dL — ABNORMAL HIGH (ref 70–99)
Glucose-Capillary: 201 mg/dL — ABNORMAL HIGH (ref 70–99)
Glucose-Capillary: 207 mg/dL — ABNORMAL HIGH (ref 70–99)
Glucose-Capillary: 207 mg/dL — ABNORMAL HIGH (ref 70–99)

## 2023-07-23 NOTE — TOC Progression Note (Signed)
Transition of Care [facility]) - Progression Note    Patient Details  Name: Tracy Spears MRN: [id number] Date of Birth: 19-Nov-1932  Transition of Care [facility]) CM/SW Contact  Garret Reddish, RN Phone Number: 07/23/2023, 11:34 AM  Clinical Narrative:    Chart reviewed.  SNF authorization still pending.    TOC will continue to follow progress of patient.          Expected Discharge Plan and Services                                               Social Determinants of Health (SDOH) Interventions SDOH Screenings   Food Insecurity: No Food Insecurity (01/21/2023)  Housing: Low Risk  (01/21/2023)  Transportation Needs: No Transportation Needs (01/21/2023)  Utilities: Not At Risk (01/21/2023)  Tobacco Use: Low Risk  (07/19/2023)    Readmission Risk Interventions     No data to display

## 2023-07-23 NOTE — Progress Notes (Signed)
Physical Therapy Treatment Patient Details Name: Tracy Spears MRN: [id number] DOB: 12-15-1932 Today's Date: 07/23/2023   History of Present Illness Patient is a 89 y.o. female with medical history significant for Diabetes, hypertension hyponatremia, gout, nephrolithiasis, independent in ADLs, being admitted with a right proximal femur fracture from an accidental fall. Current MD assessment: HTN, AKI, HLD, and R proximal femur fracture, now s/p R intramedullary nail.    PT Comments  Pt was sitting in recliner with supportive spouse at present. (Has been sitting in recliner > 6 hours today and tolerating well) She remains A and cooperative. Pt performed STS 4 x from recliner and was re-educated + performed HEP handout for strengthening. Overall pt tolerated session well but remains limited by PWB status. Chartered loss adjuster issued pt youth RW for appropriate size and delivered smaller BSC for use going forward. Overall pt is progressing but slowly. Will try to advance gait during tomorrow's session. She will greatly benefit from continued skilled PT to maximize her independence and safety with all ADLs while assisting her to PLOF. DC recs remain appropriate.     If plan is discharge home, recommend the following: A lot of help with walking and/or transfers;A lot of help with bathing/dressing/bathroom;Assist for transportation;Assistance with cooking/housework     Equipment Recommendations  Other (comment) (Defer to next level of care)       Precautions / Restrictions Precautions Precautions: Fall Restrictions Weight Bearing Restrictions: Yes RLE Weight Bearing: Partial weight bearing RLE Partial Weight Bearing Percentage or Pounds: 25     Mobility  Bed Mobility  General bed mobility comments: in recliner pre/post session    Transfers Overall transfer level: Needs assistance Equipment used: Rolling walker (2 wheels) Transfers: Sit to/from Stand Sit to Stand: Min assist  General transfer  comment: Pt continues to require assistance to safely stand while adhering to PWB. performed STS several times from recliner prior to performing ther ex     Balance Overall balance assessment: Needs assistance Sitting-balance support: Bilateral upper extremity supported, Feet supported Sitting balance-Leahy Scale: Fair     Standing balance support: Bilateral upper extremity supported, During functional activity, Reliant on assistive device for balance Standing balance-Leahy Scale: Fair Standing balance comment: reliant on RW with BUE support for all standing activity       Cognition Arousal: Alert Behavior During Therapy: WFL for tasks assessed/performed Overall Cognitive Status: Within Functional Limits for tasks assessed    General Comments: alert and oriented able to follow simple commands        Exercises General Exercises - Lower Extremity Ankle Circles/Pumps: AROM, 10 reps Quad Sets: AROM, 10 reps Gluteal Sets: 10 reps Long Arc Quad: AROM, 10 reps Heel Slides: AROM, 10 reps Hip ABduction/ADduction: AAROM Straight Leg Raises: AAROM, 10 reps        Pertinent Vitals/Pain Pain Assessment Pain Assessment: No/denies pain Pain Location: R hip Pain Descriptors / Indicators: Aching, Guarding, Sore Pain Intervention(s): Limited activity within patient's tolerance, Monitored during session, Premedicated before session, Repositioned     PT Goals (current goals can now be found in the care plan section) Acute Rehab PT Goals Patient Stated Goal: get better Progress towards PT goals: Progressing toward goals    Frequency    7X/week       AM-PAC PT "6 Clicks" Mobility   Outcome Measure  Help needed turning from your back to your side while in a flat bed without using bedrails?: A Lot Help needed moving from lying on your back to sitting on  the side of a flat bed without using bedrails?: A Lot Help needed moving to and from a bed to a chair (including a wheelchair)?:  A Lot Help needed standing up from a chair using your arms (e.g., wheelchair or bedside chair)?: A Lot Help needed to walk in hospital room?: A Lot Help needed climbing 3-5 steps with a railing? : Total 6 Click Score: 11    End of Session Equipment Utilized During Treatment: Gait belt Activity Tolerance: Patient tolerated treatment well Patient left: in chair;with call bell/phone within reach;with chair alarm set Nurse Communication: Mobility status PT Visit Diagnosis: Pain;Other abnormalities of gait and mobility (R26.89);Difficulty in walking, not elsewhere classified (R26.2);Muscle weakness (generalized) (M62.81) Pain - Right/Left: Right Pain - part of body: Hip     Time: 1540-1605 PT Time Calculation (min) (ACUTE ONLY): 25 min  Charges:    $Therapeutic Exercise: 8-22 mins $Therapeutic Activity: 8-22 mins PT General Charges $$ ACUTE PT VISIT: 1 Visit                     Jetta Lout PTA 07/23/23, 5:09 PM

## 2023-07-23 NOTE — Plan of Care (Signed)
°  Problem: Education: Goal: Knowledge of General Education information will improve Description: Including pain rating scale, medication(s)/side effects and non-pharmacologic comfort measures Outcome: Progressing   Problem: Activity: Goal: Risk for activity intolerance will decrease Outcome: Progressing   Problem: Nutrition: Goal: Adequate nutrition will be maintained Outcome: Progressing   Problem: Elimination: Goal: Will not experience complications related to bowel motility Outcome: Progressing   Problem: Safety: Goal: Ability to remain free from injury will improve Outcome: Progressing   Problem: Skin Integrity: Goal: Risk for impaired skin integrity will decrease Outcome: Progressing   

## 2023-07-23 NOTE — Progress Notes (Signed)
  Subjective: 4 Days Post-Op Procedure(s) (LRB): INTRAMEDULLARY (IM) NAIL INTERTROCHANTERIC (Right) Patient reports pain as mild.   Patient is well, and has had no acute complaints or problems Plan is to go Rehab after hospital stay. Negative for chest pain and shortness of breath Fever: no Gastrointestinal: Negative for nausea and vomiting  Objective: Vital signs in last 24 hours: Temp:  [97.5 F (36.4 C)-98.2 F (36.8 C)] 98.1 F (36.7 C) (11/15 0735) Pulse Rate:  [73-85] 85 (11/15 0735) Resp:  [14-18] 14 (11/15 0735) BP: (122-140)/(61-81) 135/81 (11/15 0735) SpO2:  [94 %-98 %] 98 % (11/15 0735)  Intake/Output from previous day:  Intake/Output Summary (Last 24 hours) at 07/23/2023 0818 Last data filed at 07/22/2023 1717 Gross per 24 hour  Intake 120 ml  Output 300 ml  Net -180 ml    Intake/Output this shift: No intake/output data recorded.  Labs: Recent Labs    07/21/23 0438  HGB 8.0*   Recent Labs    07/21/23 0438  WBC 10.1  RBC 2.75*  HCT 23.4*  PLT 149*   Recent Labs    07/21/23 0438  NA 131*  K 4.4  CL 95*  CO2 25  BUN 25*  CREATININE 1.00  GLUCOSE 188*  CALCIUM 9.2   No results for input(s): "LABPT", "INR" in the last 72 hours.   EXAM General - Patient is Alert and Oriented Extremity - Neurovascular intact Sensation intact distally Dorsiflexion/Plantar flexion intact Compartment soft Dressing/Incision - clean, dry, no drainage Motor Function - intact, moving foot and toes well on exam.  Ambulated 3 feet with physical therapy.  Sat up in the chair.  Past Medical History:  Diagnosis Date   Actinic keratosis    Basal cell carcinoma 04/07/2010   Right paranasal.    Diabetes mellitus without complication (HCC)     Assessment/Plan: 4 Days Post-Op Procedure(s) (LRB): INTRAMEDULLARY (IM) NAIL INTERTROCHANTERIC (Right) Principal Problem:   Hip fracture requiring operative repair, right, closed, initial encounter [facility]) Active  Problems:   Essential hypertension   Uncontrolled type 2 diabetes mellitus with hyperglycemia, without long-term current use of insulin (HCC)   Closed  fracture of proximal femur, right, initial encounter (HCC)   AKI (acute kidney injury) (HCC)  Estimated body mass index is 21.87 kg/m as calculated from the following:   Height as of this encounter: 5' (1.524 m).   Weight as of this encounter: 50.8 kg. Advance diet Up with therapy  Acute blood loss anemia.  Post surgical hip fracture.  Hemoglobin 8.0.  Stable.  Discharge planning: Patient will follow-up at [facility] clinic orthopedics in 2 weeks for staple removal and x-rays of the right hip.  DVT Prophylaxis - Lovenox, Foot Pumps, and TED hose 25% Weight-Bearing to right leg  Dedra Skeens, PA-C Orthopaedic Surgery 07/23/2023, 8:18 AM

## 2023-07-23 NOTE — Plan of Care (Signed)
  Problem: Education: Goal: Ability to describe self-care measures that may prevent or decrease complications (Diabetes Survival Skills Education) will improve Outcome: Progressing Goal: Individualized Educational Video(s) Outcome: Progressing   Problem: Coping: Goal: Ability to adjust to condition or change in health will improve Outcome: Progressing   Problem: Fluid Volume: Goal: Ability to maintain a balanced intake and output will improve Outcome: Progressing   Problem: Health Behavior/Discharge Planning: Goal: Ability to identify and utilize available resources and services will improve Outcome: Progressing Goal: Ability to manage health-related needs will improve Outcome: Progressing   Problem: Metabolic: Goal: Ability to maintain appropriate glucose levels will improve Outcome: Progressing   Problem: Nutritional: Goal: Maintenance of adequate nutrition will improve Outcome: Progressing Goal: Progress toward achieving an optimal weight will improve Outcome: Progressing   Problem: Skin Integrity: Goal: Risk for impaired skin integrity will decrease Outcome: Progressing   Problem: Tissue Perfusion: Goal: Adequacy of tissue perfusion will improve Outcome: Progressing   Problem: Education: Goal: Knowledge of General Education information will improve Description: Including pain rating scale, medication(s)/side effects and non-pharmacologic comfort measures Outcome: Progressing   Problem: Health Behavior/Discharge Planning: Goal: Ability to manage health-related needs will improve Outcome: Progressing   Problem: Clinical Measurements: Goal: Ability to maintain clinical measurements within normal limits will improve Outcome: Progressing Goal: Will remain free from infection Outcome: Progressing Goal: Diagnostic test results will improve Outcome: Progressing Goal: Respiratory complications will improve Outcome: Progressing Goal: Cardiovascular complication will  be avoided Outcome: Progressing   Problem: Activity: Goal: Risk for activity intolerance will decrease Outcome: Progressing   Problem: Nutrition: Goal: Adequate nutrition will be maintained Outcome: Progressing   Problem: Coping: Goal: Level of anxiety will decrease Outcome: Progressing   Problem: Elimination: Goal: Will not experience complications related to bowel motility Outcome: Progressing Goal: Will not experience complications related to urinary retention Outcome: Progressing   Problem: Pain Management: Goal: General experience of comfort will improve Outcome: Progressing   Problem: Safety: Goal: Ability to remain free from injury will improve Outcome: Progressing   Problem: Skin Integrity: Goal: Risk for impaired skin integrity will decrease Outcome: Progressing   Problem: Education: Goal: Verbalization of understanding the information provided (i.e., activity precautions, restrictions, etc) will improve Outcome: Progressing Goal: Individualized Educational Video(s) Outcome: Progressing   Problem: Activity: Goal: Ability to ambulate and perform ADLs will improve Outcome: Progressing   Problem: Clinical Measurements: Goal: Postoperative complications will be avoided or minimized Outcome: Progressing   Problem: Self-Concept: Goal: Ability to maintain and perform role responsibilities to the fullest extent possible will improve Outcome: Progressing   Problem: Pain Management: Goal: Pain level will decrease Outcome: Progressing

## 2023-07-23 NOTE — Care Management Important Message (Signed)
Important Message  Patient Details  Name: Tracy Spears MRN: [id number] Date of Birth: Jan 27, 1933   Important Message Given:  Yes - Medicare IM     [name] [name] 07/23/2023, 3:03 PM

## 2023-07-23 NOTE — Progress Notes (Signed)
PROGRESS NOTE    Tracy Spears   YQM:[id number] DOB: 02/05/1933  DOA: 07/18/2023 Date of Service: 07/23/23 which is hospital day 4  PCP: Lauro Regulus, MD    HPI: Tracy Spears is an active 89 y.o. female with a history of T2DM, HTN, hyponatremia, gout, nephrolithiasis who presented to the ED on 07/18/2023 after tripping on laundry at home, falling on right hip with significant pain.    Hospital course / significant events:  11/10: subtrochanteric right femur fracture.  11/11: fracture repair  11/12-11/15: awaiting SNF rehab placement   Consultants:  Orthopedics  Procedures/Surgeries: 07/19/23; IM nail R femur fracture repair       ASSESSMENT & PLAN:   Closed right subtrochanteric femur fracture from mechanical fall at home: s/p IM nail 11/11 by Dr. Rosann Auerbach.  Await SNF rehab placement  Continue multimodal pain regimen, attempting to limit narcotics.  Discharge planning: Patient will follow-up at [facility] clinic orthopedics in 2 weeks for staple removal and x-rays of the right hip. DVT Prophylaxis - Lovenox, Foot Pumps, and TED hose 25% Weight-Bearing to right leg   Acute postoperative blood loss anemia:  Monitor CBC   Thrombocytopenia: Mild.  Monitor CBC   HTN Chronic HFpEF: Appears euvolemic HLD CXR with cardiomegaly (AP film), pulmonary vascular congestion though she has no dyspnea, hypoxia, or crackles on exam. She has no respiratory symptoms and is without dyspnea completely supine. Last echo May 2024 showed LVEF 60-65%, no RWMA, grade 2 diastolic dysfunction. Normal RV and no hemodynamically significant valvulopathy. Severe LAE. holding home losartan d/t AKI, low BP Holding home carvedilol d/t low BP Held torsemide given AKI, this can resume as needed  Aspirin Statin    AKI, resolved  Monitor BMP. Avoid nephrotoxins  Holding gabapentin and metformin     Nausea: Abd exam benign but at risk of constipation Start regular bowel  regimen Continue prn antiemetics.    T2DM: HbA1c 7.1% indicating adequate control  Continue SSI   GERD Continue home PPI  Gout: Quiescent.     Normal weight based on BMI: Body mass index is 21.87 kg/m.  Underweight - under 18.5  normal weight - 18.5 to 24.9 overweight - 25 to 29.9 obese - 30 or more   DVT prophylaxis: lovenox IV fluids: no continuous IV fluids  Nutrition: regular Central lines / invasive devices: none  Code Status: FULL CODE ACP documentation reviewed: 07/21/23 and none on file in [facility]  [facility] needs: SNF placement Barriers to dispo / significant pending items: placement for rehab              Subjective / Brief ROS:  Patient reports doing well today Denies CP/SOB.  Pain controlled.    Family Communication: husband at bedside on rounds     Objective Findings:  Vitals:   07/22/23 0800 07/22/23 1428 07/22/23 2051 07/23/23 0735  BP: (!) 118/54 122/67 (!) 140/61 135/81  Pulse: 74 73 78 85  Resp: 14 14 18 14   Temp: 98.3 F (36.8 C) (!) 97.5 F (36.4 C) 98.2 F (36.8 C) 98.1 F (36.7 C)  TempSrc: Oral Oral Oral Oral  SpO2: 92% 95% 94% 98%  Weight:      Height:        Intake/Output Summary (Last 24 hours) at 07/23/2023 1627 Last data filed at 07/22/2023 1717 Gross per 24 hour  Intake --  Output 300 ml  Net -300 ml   Filed Weights   07/18/23 2354 07/19/23 1310  Weight: 50.8 kg 50.8  kg    Examination:  Physical Exam Constitutional:      General: She is not in acute distress. Cardiovascular:     Rate and Rhythm: Normal rate and regular rhythm.  Pulmonary:     Effort: Pulmonary effort is normal.     Breath sounds: Normal breath sounds.  Musculoskeletal:     Right lower leg: No edema.     Left lower leg: No edema.  Neurological:     General: No focal deficit present.     Mental Status: She is alert and oriented to person, place, and time. Mental status is at baseline.  Psychiatric:        Mood and Affect: Mood  normal.        Behavior: Behavior normal.          Scheduled Medications:   aspirin  81 mg Oral Daily   atorvastatin  10 mg Oral Daily   cholecalciferol  2,000 Units Oral Daily   enoxaparin (LOVENOX) injection  30 mg Subcutaneous Q24H   feeding supplement (GLUCERNA SHAKE)  237 mL Oral TID BM   insulin aspart  0-9 Units Subcutaneous TID AC & HS   multivitamin with minerals  1 tablet Oral Daily   pantoprazole  40 mg Oral Daily   polyethylene glycol  17 g Oral Daily   senna-docusate  1 tablet Oral Daily   traMADol  100 mg Oral Q12H    Continuous Infusions:   PRN Medications:  acetaminophen, HYDROcodone-acetaminophen, menthol-cetylpyridinium **OR** phenol, methocarbamol **OR** methocarbamol (ROBAXIN) injection, metoCLOPramide **OR** metoCLOPramide (REGLAN) injection, morphine injection, ondansetron **OR** ondansetron (ZOFRAN) IV, traZODone  Antimicrobials from admission:  Anti-infectives (From admission, onward)    Start     Dose/Rate Route Frequency Ordered Stop   07/19/23 1445  ceFAZolin (ANCEF) IVPB 2g/100 mL premix        2 g 200 mL/hr over 30 Minutes Intravenous  Once 07/19/23 1435 07/19/23 1543   07/19/23 0930  ceFAZolin (ANCEF) IVPB 2g/100 mL premix        2 g 200 mL/hr over 30 Minutes Intravenous To Surgery 07/19/23 0835 07/19/23 1117   07/19/23 0915  ceFAZolin (ANCEF) powder 2 g  Status:  Discontinued        2 g Other To Surgery 07/19/23 1610 07/19/23 0834           Data Reviewed:  I have personally reviewed the following...  CBC: Recent Labs  Lab 07/19/23 0132 07/20/23 0433 07/21/23 0438  WBC 7.2 8.4 10.1  NEUTROABS 4.1  --   --   HGB 10.1* 8.2* 8.0*  HCT 30.9* 24.8* 23.4*  MCV 88.0 85.2 85.1  PLT 195 144* 149*   Basic Metabolic Panel: Recent Labs  Lab 07/19/23 0132 07/20/23 0433 07/21/23 0438  NA 133* 133* 131*  K 3.9 3.7 4.4  CL 99 101 95*  CO2 27 25 25   GLUCOSE 220* 137* 188*  BUN 29* 19 25*  CREATININE 1.11* 0.86 1.00  CALCIUM  9.1 8.9 9.2   GFR: Estimated Creatinine Clearance: 27.9 mL/min (by C-G formula based on SCr of 1 mg/dL). Liver Function Tests: No results for input(s): "AST", "ALT", "ALKPHOS", "BILITOT", "PROT", "ALBUMIN" in the last 168 hours. No results for input(s): "LIPASE", "AMYLASE" in the last 168 hours. No results for input(s): "AMMONIA" in the last 168 hours. Coagulation Profile: No results for input(s): "INR", "PROTIME" in the last 168 hours. Cardiac Enzymes: No results for input(s): "CKTOTAL", "CKMB", "CKMBINDEX", "TROPONINI" in the last 168 hours. BNP (last 3  results) No results for input(s): "PROBNP" in the last 8760 hours. HbA1C: No results for input(s): "HGBA1C" in the last 72 hours. CBG: Recent Labs  Lab 07/22/23 1134 07/22/23 1612 07/22/23 2051 07/23/23 0737 07/23/23 1133  GLUCAP 191* 184* 194* 151* 201*   Lipid Profile: No results for input(s): "CHOL", "HDL", "LDLCALC", "TRIG", "CHOLHDL", "LDLDIRECT" in the last 72 hours. Thyroid Function Tests: No results for input(s): "TSH", "T4TOTAL", "FREET4", "T3FREE", "THYROIDAB" in the last 72 hours. Anemia Panel: No results for input(s): "VITAMINB12", "FOLATE", "FERRITIN", "TIBC", "IRON", "RETICCTPCT" in the last 72 hours. Most Recent Urinalysis On File:  No results found for: "COLORURINE", "APPEARANCEUR", "LABSPEC", "PHURINE", "GLUCOSEU", "HGBUR", "BILIRUBINUR", "KETONESUR", "PROTEINUR", "UROBILINOGEN", "NITRITE", "LEUKOCYTESUR" Sepsis Labs: @LABRCNTIP (procalcitonin:4,lacticidven:4) Microbiology: No results found for this or any previous visit (from the past 240 hour(s)).    Radiology Studies last 3 days: DG HIP UNILAT WITH PELVIS 2-3 VIEWS RIGHT  Result Date: 07/19/2023 CLINICAL DATA:  Elective surgery. EXAM: DG HIP (WITH OR WITHOUT PELVIS) 2-3V RIGHT COMPARISON:  Preoperative imaging. FINDINGS: Five fluoroscopic spot views of the right hip and femur obtained in the operating room. Femoral intramedullary nail with trans  trochanteric and distal locking screw fixation traversing comminuted proximal femur fracture. Fluoroscopy time 2 minutes 28 seconds. Dose 19.54 mGy. IMPRESSION: Intraoperative fluoroscopy during proximal femur fracture fixation. Electronically Signed   By: Narda Rutherford M.D.   On: 07/19/2023 19:01   DG C-Arm 1-60 Min-No Report  Result Date: 07/19/2023 Fluoroscopy was utilized by the requesting physician.  No radiographic interpretation.   DG C-Arm 1-60 Min-No Report  Result Date: 07/19/2023 Fluoroscopy was utilized by the requesting physician.  No radiographic interpretation.        Sunnie Nielsen, DO [facility] 07/23/2023, 4:27 PM    Dictation software may have been used to generate the above note. Typos may occur and escape review in typed/dictated notes. Please contact Dr Lyn Hollingshead directly for clarity if needed.  Staff may message me via secure chat in Epic  but this may not receive an immediate response,  please page me for urgent matters!  If 7PM-7AM, please contact night coverage [email or web address]

## 2023-07-23 NOTE — Progress Notes (Signed)
Nutrition Follow-up  DOCUMENTATION CODES:   Not applicable  INTERVENTION:   -Continue Glucerna Shake po TID, each supplement provides 220 kcal and 10 grams of protein  -Continue MVI with minerals daily  NUTRITION DIAGNOSIS:   Increased nutrient needs related to post-op healing as evidenced by estimated needs.  Ongoing  GOAL:   Patient will meet greater than or equal to 90% of their needs  Progressing   MONITOR:   PO intake, Supplement acceptance  REASON FOR ASSESSMENT:   Consult Assessment of nutrition requirement/status  ASSESSMENT:   Pt with medical history significant for Diabetes, hypertension hyponatremia, gout, nephrolithiasis, independent in ADLs, being admitted with a right proximal femur fracture from an accidental fall when she tripped over something on the floor while doing her laundry  11/11- s/p INTRAMEDULLARY (IM) NAIL INTERTROCHANTERIC   Reviewed I/O's: -180 ml x 24 hours and +205 ml since admission  UOP: 300 ml x 24 hours  Pt is on a regular diet with good appetite. Noted meal completions 100%. Pt is also consuming Glucerna supplements.   No new wt since last visit.   Per TOC notes, plan to discharge to SNF ([facility]) once insurance authorization has been obtained.   Medications reviewed and include vitamin D3, protonix, miralax, and senokot.   Labs reviewed: Na: 131, CBGS: 131-191 (inpatient orders for glycemic control are 0-9 units insulin aspart TID before meals and at bedtime).    Diet Order:   Diet Order             Diet regular Room service appropriate? Yes; Fluid consistency: Thin  Diet effective now                   EDUCATION NEEDS:   Education needs have been addressed  Skin:  Skin Assessment: Skin Integrity Issues: Skin Integrity Issues:: Incisions Incisions: closed rt hip  Last BM:  07/18/23  Height:   Ht Readings from Last 1 Encounters:  07/19/23 5' (1.524 m)    Weight:   Wt Readings from Last 1  Encounters:  07/19/23 50.8 kg    Ideal Body Weight:  45.5 kg  BMI:  Body mass index is 21.87 kg/m.  Estimated Nutritional Needs:   Kcal:  1500-1700  Protein:  75-90 grams  Fluid:  > 1.5 L    Levada Schilling, RD, LDN, CDCES Registered Dietitian III Certified Diabetes Care and Education Specialist Please refer to [facility] for RD and/or RD on-call/weekend/after hours pager

## 2023-07-24 DIAGNOSIS — S72001A Fracture of unspecified part of neck of right femur, initial encounter for closed fracture: Secondary | ICD-10-CM | POA: Diagnosis not present

## 2023-07-24 LAB — CBC
HCT: 21.4 % — ABNORMAL LOW (ref 36.0–46.0)
Hemoglobin: 7.2 g/dL — ABNORMAL LOW (ref 12.0–15.0)
MCH: 28.9 pg (ref 26.0–34.0)
MCHC: 33.6 g/dL (ref 30.0–36.0)
MCV: 85.9 fL (ref 80.0–100.0)
Platelets: 179 10*3/uL (ref 150–400)
RBC: 2.49 MIL/uL — ABNORMAL LOW (ref 3.87–5.11)
RDW: 15.8 % — ABNORMAL HIGH (ref 11.5–15.5)
WBC: 6.3 10*3/uL (ref 4.0–10.5)
nRBC: 0 % (ref 0.0–0.2)

## 2023-07-24 LAB — BASIC METABOLIC PANEL
Anion gap: 9 (ref 5–15)
BUN: 49 mg/dL — ABNORMAL HIGH (ref 8–23)
CO2: 24 mmol/L (ref 22–32)
Calcium: 9.5 mg/dL (ref 8.9–10.3)
Chloride: 95 mmol/L — ABNORMAL LOW (ref 98–111)
Creatinine, Ser: 1.04 mg/dL — ABNORMAL HIGH (ref 0.44–1.00)
GFR, Estimated: 52 mL/min — ABNORMAL LOW (ref >60)
Glucose, Bld: 148 mg/dL — ABNORMAL HIGH (ref 70–99)
Potassium: 4.1 mmol/L (ref 3.5–5.1)
Sodium: 128 mmol/L — ABNORMAL LOW (ref 135–145)

## 2023-07-24 LAB — HEMOGLOBIN AND HEMATOCRIT, BLOOD
HCT: 28.2 % — ABNORMAL LOW (ref 36.0–46.0)
Hemoglobin: 9.7 g/dL — ABNORMAL LOW (ref 12.0–15.0)

## 2023-07-24 LAB — GLUCOSE, CAPILLARY
Glucose-Capillary: 132 mg/dL — ABNORMAL HIGH (ref 70–99)
Glucose-Capillary: 253 mg/dL — ABNORMAL HIGH (ref 70–99)
Glucose-Capillary: 159 mg/dL — ABNORMAL HIGH (ref 70–99)
Glucose-Capillary: 162 mg/dL — ABNORMAL HIGH (ref 70–99)

## 2023-07-24 LAB — PREPARE RBC (CROSSMATCH)

## 2023-07-24 MED ORDER — SODIUM CHLORIDE 0.9% IV SOLUTION: Freq: Once | INTRAVENOUS | Status: AC

## 2023-07-24 NOTE — Progress Notes (Signed)
PROGRESS NOTE    Tracy Spears   UJW:[id number] DOB: [birth year]  DOA: 07/18/2023 Date of Service: 07/24/23 which is hospital day 5  PCP: Lauro Regulus, MD    HPI: Tracy Spears is an active 89 y.o. female with a history of T2DM, HTN, hyponatremia, gout, nephrolithiasis who presented to the ED on 07/18/2023 after tripping on laundry at home, falling on right hip with significant pain.    Hospital course / significant events:  11/10: subtrochanteric right femur fracture.  11/11: fracture repair  11/12-11/16: awaiting SNF rehab placement   Consultants:  Orthopedics  Procedures/Surgeries: 07/19/23; IM nail R femur fracture repair       ASSESSMENT & PLAN:   Closed right subtrochanteric femur fracture from mechanical fall at home: s/p IM nail 11/11 by Dr. Rosann Auerbach.  Await SNF rehab placement  Continue multimodal pain regimen, attempting to limit narcotics.  Discharge planning: Patient will follow-up at [facility] clinic orthopedics in 2 weeks for staple removal and x-rays of the right hip. DVT Prophylaxis - Lovenox, Foot Pumps, and TED hose 25% Weight-Bearing to right leg   Acute postoperative blood loss anemia:  Monitor CBC   Thrombocytopenia: Mild.  Monitor CBC   HTN Chronic HFpEF: Appears euvolemic HLD CXR with cardiomegaly (AP film), pulmonary vascular congestion though she has no dyspnea, hypoxia, or crackles on exam. She has no respiratory symptoms and is without dyspnea completely supine. Last echo May 2024 showed LVEF 60-65%, no RWMA, grade 2 diastolic dysfunction. Normal RV and no hemodynamically significant valvulopathy. Severe LAE. holding home losartan d/t AKI, low BP Holding home carvedilol d/t low BP Held torsemide given AKI, this can resume as needed  Aspirin Statin    AKI, resolved  Monitor BMP. Avoid nephrotoxins  Holding gabapentin and metformin     Nausea: Abd exam benign but at risk of constipation Start regular bowel  regimen Continue prn antiemetics.    T2DM: HbA1c 7.1% indicating adequate control  Continue SSI   GERD Continue home PPI  Gout: Quiescent.     Normal weight based on BMI: Body mass index is 21.87 kg/m.  Underweight - under 18.5  normal weight - 18.5 to 24.9 overweight - 25 to 29.9 obese - 30 or more   DVT prophylaxis: lovenox IV fluids: no continuous IV fluids  Nutrition: regular Central lines / invasive devices: none  Code Status: FULL CODE ACP documentation reviewed: 07/21/23 and none on file in [facility]  TOC needs: SNF placement Barriers to dispo / significant pending items: placement for rehab              Subjective / Brief ROS:  Patient reports doing well today Reports recent BM< constipation has resolved  Denies CP/SOB.  Pain controlled.    Family Communication: husband at bedside on rounds     Objective Findings:  Vitals:   07/23/23 2259 07/24/23 0739 07/24/23 1041 07/24/23 1101  BP: (!) 117/54 (!) 148/63 (!) 124/53 110/63  Pulse: 71 69 80 71  Resp: 18 19 20 18   Temp: 97.8 F (36.6 C) 98 F (36.7 C) 97.8 F (36.6 C) 98.3 F (36.8 C)  TempSrc:   Oral Oral  SpO2: 97% 97% 94%   Weight:      Height:        Intake/Output Summary (Last 24 hours) at 07/24/2023 1245 Last data filed at 07/24/2023 1000 Gross per 24 hour  Intake 240 ml  Output --  Net 240 ml   Filed Weights   07/18/23 2354  07/19/23 1310  Weight: 50.8 kg 50.8 kg    Examination:  Physical Exam Constitutional:      General: She is not in acute distress. Cardiovascular:     Rate and Rhythm: Normal rate and regular rhythm.  Pulmonary:     Effort: Pulmonary effort is normal.     Breath sounds: Normal breath sounds.  Musculoskeletal:     Right lower leg: No edema.     Left lower leg: No edema.  Neurological:     General: No focal deficit present.     Mental Status: She is alert and oriented to person, place, and time. Mental status is at baseline.  Psychiatric:         Mood and Affect: Mood normal.        Behavior: Behavior normal.          Scheduled Medications:   aspirin  81 mg Oral Daily   atorvastatin  10 mg Oral Daily   cholecalciferol  2,000 Units Oral Daily   enoxaparin (LOVENOX) injection  30 mg Subcutaneous Q24H   feeding supplement (GLUCERNA SHAKE)  237 mL Oral TID BM   insulin aspart  0-9 Units Subcutaneous TID AC & HS   multivitamin with minerals  1 tablet Oral Daily   pantoprazole  40 mg Oral Daily   polyethylene glycol  17 g Oral Daily   senna-docusate  1 tablet Oral Daily   traMADol  100 mg Oral Q12H    Continuous Infusions:   PRN Medications:  acetaminophen, HYDROcodone-acetaminophen, menthol-cetylpyridinium **OR** phenol, methocarbamol **OR** methocarbamol (ROBAXIN) injection, metoCLOPramide **OR** metoCLOPramide (REGLAN) injection, morphine injection, ondansetron **OR** ondansetron (ZOFRAN) IV, traZODone  Antimicrobials from admission:  Anti-infectives (From admission, onward)    Start     Dose/Rate Route Frequency Ordered Stop   07/19/23 1445  ceFAZolin (ANCEF) IVPB 2g/100 mL premix        2 g 200 mL/hr over 30 Minutes Intravenous  Once 07/19/23 1435 07/19/23 1543   07/19/23 0930  ceFAZolin (ANCEF) IVPB 2g/100 mL premix        2 g 200 mL/hr over 30 Minutes Intravenous To Surgery 07/19/23 0835 07/19/23 1117   07/19/23 0915  ceFAZolin (ANCEF) powder 2 g  Status:  Discontinued        2 g Other To Surgery 07/19/23 0347 07/19/23 0834           Data Reviewed:  I have personally reviewed the following...  CBC: Recent Labs  Lab 07/19/23 0132 07/20/23 0433 07/21/23 0438 07/24/23 0504  WBC 7.2 8.4 10.1 6.3  NEUTROABS 4.1  --   --   --   HGB 10.1* 8.2* 8.0* 7.2*  HCT 30.9* 24.8* 23.4* 21.4*  MCV 88.0 85.2 85.1 85.9  PLT 195 144* 149* 179   Basic Metabolic Panel: Recent Labs  Lab 07/19/23 0132 07/20/23 0433 07/21/23 0438 07/24/23 0504  NA 133* 133* 131* 128*  K 3.9 3.7 4.4 4.1  CL 99 101 95*  95*  CO2 27 25 25 24   GLUCOSE 220* 137* 188* 148*  BUN 29* 19 25* 49*  CREATININE 1.11* 0.86 1.00 1.04*  CALCIUM 9.1 8.9 9.2 9.5   GFR: Estimated Creatinine Clearance: 26.9 mL/min (A) (by C-G formula based on SCr of 1.04 mg/dL (H)). Liver Function Tests: No results for input(s): "AST", "ALT", "ALKPHOS", "BILITOT", "PROT", "ALBUMIN" in the last 168 hours. No results for input(s): "LIPASE", "AMYLASE" in the last 168 hours. No results for input(s): "AMMONIA" in the last 168 hours. Coagulation Profile:  No results for input(s): "INR", "PROTIME" in the last 168 hours. Cardiac Enzymes: No results for input(s): "CKTOTAL", "CKMB", "CKMBINDEX", "TROPONINI" in the last 168 hours. BNP (last 3 results) No results for input(s): "PROBNP" in the last 8760 hours. HbA1C: No results for input(s): "HGBA1C" in the last 72 hours. CBG: Recent Labs  Lab 07/23/23 1133 07/23/23 1731 07/23/23 2009 07/24/23 0740 07/24/23 1157  GLUCAP 201* 207* 207* 132* 253*   Lipid Profile: No results for input(s): "CHOL", "HDL", "LDLCALC", "TRIG", "CHOLHDL", "LDLDIRECT" in the last 72 hours. Thyroid Function Tests: No results for input(s): "TSH", "T4TOTAL", "FREET4", "T3FREE", "THYROIDAB" in the last 72 hours. Anemia Panel: No results for input(s): "VITAMINB12", "FOLATE", "FERRITIN", "TIBC", "IRON", "RETICCTPCT" in the last 72 hours. Most Recent Urinalysis On File:  No results found for: "COLORURINE", "APPEARANCEUR", "LABSPEC", "PHURINE", "GLUCOSEU", "HGBUR", "BILIRUBINUR", "KETONESUR", "PROTEINUR", "UROBILINOGEN", "NITRITE", "LEUKOCYTESUR" Sepsis Labs: @LABRCNTIP (procalcitonin:4,lacticidven:4) Microbiology: No results found for this or any previous visit (from the past 240 hour(s)).    Radiology Studies last 3 days: No results found.      Sunnie Nielsen, DO [facility] 07/24/2023, 12:45 PM    Dictation software may have been used to generate the above note. Typos may occur and escape review  in typed/dictated notes. Please contact Dr Lyn Hollingshead directly for clarity if needed.  Staff may message me via secure chat in Epic  but this may not receive an immediate response,  please page me for urgent matters!  If 7PM-7AM, please contact night coverage [email or web address]

## 2023-07-24 NOTE — Plan of Care (Signed)
  Problem: Education: Goal: Ability to describe self-care measures that may prevent or decrease complications (Diabetes Survival Skills Education) will improve Outcome: Progressing Goal: Individualized Educational Video(s) Outcome: Progressing   Problem: Coping: Goal: Ability to adjust to condition or change in health will improve Outcome: Progressing   Problem: Fluid Volume: Goal: Ability to maintain a balanced intake and output will improve Outcome: Progressing   Problem: Health Behavior/Discharge Planning: Goal: Ability to identify and utilize available resources and services will improve Outcome: Progressing Goal: Ability to manage health-related needs will improve Outcome: Progressing   Problem: Metabolic: Goal: Ability to maintain appropriate glucose levels will improve Outcome: Progressing   Problem: Nutritional: Goal: Maintenance of adequate nutrition will improve Outcome: Progressing Goal: Progress toward achieving an optimal weight will improve Outcome: Progressing   Problem: Skin Integrity: Goal: Risk for impaired skin integrity will decrease Outcome: Progressing   Problem: Tissue Perfusion: Goal: Adequacy of tissue perfusion will improve Outcome: Progressing   Problem: Education: Goal: Knowledge of General Education information will improve Description: Including pain rating scale, medication(s)/side effects and non-pharmacologic comfort measures Outcome: Progressing   Problem: Health Behavior/Discharge Planning: Goal: Ability to manage health-related needs will improve Outcome: Progressing   Problem: Clinical Measurements: Goal: Ability to maintain clinical measurements within normal limits will improve Outcome: Progressing Goal: Will remain free from infection Outcome: Progressing Goal: Diagnostic test results will improve Outcome: Progressing Goal: Respiratory complications will improve Outcome: Progressing Goal: Cardiovascular complication will  be avoided Outcome: Progressing   Problem: Activity: Goal: Risk for activity intolerance will decrease Outcome: Progressing   Problem: Nutrition: Goal: Adequate nutrition will be maintained Outcome: Progressing   Problem: Coping: Goal: Level of anxiety will decrease Outcome: Progressing   Problem: Elimination: Goal: Will not experience complications related to bowel motility Outcome: Progressing Goal: Will not experience complications related to urinary retention Outcome: Progressing   Problem: Pain Management: Goal: General experience of comfort will improve Outcome: Progressing   Problem: Safety: Goal: Ability to remain free from injury will improve Outcome: Progressing   Problem: Skin Integrity: Goal: Risk for impaired skin integrity will decrease Outcome: Progressing   Problem: Education: Goal: Verbalization of understanding the information provided (i.e., activity precautions, restrictions, etc) will improve Outcome: Progressing Goal: Individualized Educational Video(s) Outcome: Progressing   Problem: Activity: Goal: Ability to ambulate and perform ADLs will improve Outcome: Progressing   Problem: Clinical Measurements: Goal: Postoperative complications will be avoided or minimized Outcome: Progressing   Problem: Self-Concept: Goal: Ability to maintain and perform role responsibilities to the fullest extent possible will improve Outcome: Progressing   Problem: Pain Management: Goal: Pain level will decrease Outcome: Progressing

## 2023-07-24 NOTE — Progress Notes (Signed)
Physical Therapy Treatment Patient Details Name: Tracy Spears MRN: [id number] DOB: [birth year] Today's Date: 07/24/2023   History of Present Illness Patient is a 89 y.o. female with medical history significant for Diabetes, hypertension hyponatremia, gout, nephrolithiasis, independent in ADLs, being admitted with a right proximal femur fracture from an accidental fall. Current MD assessment: HTN, AKI, HLD, and R proximal femur fracture, now s/p R intramedullary nail.    PT Comments  Pt was long sitting in bed finishing breakfast upon arrival. She is alert and remains extremely pleasant and cooperative. Pt requires increased time to perform task but puts forth great effort throughout. She continues to require assistance to safely exit bed, stand, and ambulate short distances. Does well adhering to PWB 25% but once fatigued needs increased assistance and vcs for adhering. Overall pt is progressing. Reviewed importance of there ex throughout the day. Pt had blood ordered for 7.2 Hgb. Dc recs remain appropriate.     If plan is discharge home, recommend the following: A lot of help with walking and/or transfers;A lot of help with bathing/dressing/bathroom;Assistance with cooking/housework;Direct supervision/assist for medications management;Direct supervision/assist for financial management;Assist for transportation;Help with stairs or ramp for entrance     Equipment Recommendations  Other (comment) (Defer to next level of care)       Precautions / Restrictions Precautions Precautions: Fall Restrictions Weight Bearing Restrictions: Yes RUE Weight Bearing: Partial weight bearing RUE Partial Weight Bearing Percentage or Pounds: 25% on RLE RLE Weight Bearing: Partial weight bearing RLE Partial Weight Bearing Percentage or Pounds: 25     Mobility  Bed Mobility Overal bed mobility: Needs Assistance Bed Mobility: Supine to Sit Supine to sit: HOB elevated, Mod assist General bed mobility  comments: increased time required with vsc for technique improvements    Transfers Overall transfer level: Needs assistance Equipment used: Rolling walker (2 wheels) Transfers: Sit to/from Stand Sit to Stand: Min assist   Ambulation/Gait Ambulation/Gait assistance: Editor, commissioning (Feet): 10 Feet Assistive device: Rolling walker (2 wheels) Gait Pattern/deviations: Decreased step length - right, Decreased step length - left, Decreased weight shift to right, Decreased stance time - right, Step-to pattern, Antalgic Gait velocity: decreased     General Gait Details: pt ambulated with RW 10 ft with step to pattern + min assist. Once she started to fatigued she started to have difficulty adhering to PWB 25%    Balance Overall balance assessment: Needs assistance Sitting-balance support: Bilateral upper extremity supported, Feet supported Sitting balance-Leahy Scale: Fair     Standing balance support: Bilateral upper extremity supported, During functional activity, Reliant on assistive device for balance Standing balance-Leahy Scale: Fair      Cognition Arousal: Alert Behavior During Therapy: WFL for tasks assessed/performed Overall Cognitive Status: Within Functional Limits for tasks assessed        General Comments: alert and oriented able to follow simple commands           General Comments General comments (skin integrity, edema, etc.): reviewed need to be performing HEP throughout the day.      Pertinent Vitals/Pain Pain Assessment Pain Assessment: 0-10 Pain Score: 3  Faces Pain Scale: Hurts a little bit Pain Location: R hip Pain Descriptors / Indicators: Aching, Guarding, Sore Pain Intervention(s): Limited activity within patient's tolerance, Monitored during session, Premedicated before session, Repositioned     PT Goals (current goals can now be found in the care plan section) Acute Rehab PT Goals Patient Stated Goal: get stronger and move  better Progress towards  PT goals: Progressing toward goals    Frequency    7X/week       AM-PAC PT "6 Clicks" Mobility   Outcome Measure  Help needed turning from your back to your side while in a flat bed without using bedrails?: A Little Help needed moving from lying on your back to sitting on the side of a flat bed without using bedrails?: A Lot Help needed moving to and from a bed to a chair (including a wheelchair)?: A Lot Help needed standing up from a chair using your arms (e.g., wheelchair or bedside chair)?: A Lot Help needed to walk in hospital room?: A Lot Help needed climbing 3-5 steps with a railing? : A Lot 6 Click Score: 13    End of Session   Activity Tolerance: Patient tolerated treatment well;Other (comment) (limited by [facility] restrictions) Patient left: in chair;with call bell/phone within reach;with chair alarm set Nurse Communication: Mobility status PT Visit Diagnosis: Pain;Other abnormalities of gait and mobility (R26.89);Difficulty in walking, not elsewhere classified (R26.2);Muscle weakness (generalized) (M62.81) Pain - Right/Left: Right Pain - part of body: Hip     Time: 5643-3295 PT Time Calculation (min) (ACUTE ONLY): 23 min  Charges:    $Gait Training: 8-22 mins $Therapeutic Activity: 8-22 mins PT General Charges $$ ACUTE PT VISIT: 1 Visit                    Jetta Lout PTA 07/24/23, 1:10 PM

## 2023-07-24 NOTE — Plan of Care (Signed)
  Problem: Fluid Volume: Goal: Ability to maintain a balanced intake and output will improve Outcome: Progressing   Problem: Nutritional: Goal: Maintenance of adequate nutrition will improve Outcome: Progressing   Problem: Clinical Measurements: Goal: Respiratory complications will improve Outcome: Progressing   Problem: Activity: Goal: Risk for activity intolerance will decrease Outcome: Progressing   Problem: Coping: Goal: Level of anxiety will decrease Outcome: Progressing   Problem: Elimination: Goal: Will not experience complications related to bowel motility Outcome: Progressing Goal: Will not experience complications related to urinary retention Outcome: Progressing

## 2023-07-24 NOTE — Progress Notes (Signed)
  Subjective: 5 Days Post-Op Procedure(s) (LRB): INTRAMEDULLARY (IM) NAIL INTERTROCHANTERIC (Right) Patient reports pain as mild.   Patient is well, and has had no acute complaints or problems Plan is to go Rehab after hospital stay. Negative for chest pain and shortness of breath Fever: no Gastrointestinal: Negative for nausea and vomiting  Objective: Vital signs in last 24 hours: Temp:  [97.5 F (36.4 C)-98.1 F (36.7 C)] 97.8 F (36.6 C) (11/15 2259) Pulse Rate:  [71-85] 71 (11/15 2259) Resp:  [14-18] 18 (11/15 2259) BP: (117-135)/(54-81) 117/54 (11/15 2259) SpO2:  [95 %-98 %] 97 % (11/15 2259)  Intake/Output from previous day:  Intake/Output Summary (Last 24 hours) at 07/24/2023 0717 Last data filed at 07/23/2023 1300 Gross per 24 hour  Intake 120 ml  Output --  Net 120 ml    Intake/Output this shift: No intake/output data recorded.  Labs: Recent Labs    07/24/23 0504  HGB 7.2*   Recent Labs    07/24/23 0504  WBC 6.3  RBC 2.49*  HCT 21.4*  PLT 179   Recent Labs    07/24/23 0504  NA 128*  K 4.1  CL 95*  CO2 24  BUN 49*  CREATININE 1.04*  GLUCOSE 148*  CALCIUM 9.5   No results for input(s): "LABPT", "INR" in the last 72 hours.   EXAM General - Patient is Alert and Oriented Extremity - Neurovascular intact Sensation intact distally Dorsiflexion/Plantar flexion intact Compartment soft Dressing/Incision -bulky dressing intact.  Clean, dry, no drainage Motor Function - intact, moving foot and toes well on exam.     Past Medical History:  Diagnosis Date   Actinic keratosis    Basal cell carcinoma 04/07/2010   Right paranasal.    Diabetes mellitus without complication (HCC)     Assessment/Plan: 5 Days Post-Op Procedure(s) (LRB): INTRAMEDULLARY (IM) NAIL INTERTROCHANTERIC (Right) Principal Problem:   Hip fracture requiring operative repair, right, closed, initial encounter [facility]) Active Problems:   Essential hypertension   Uncontrolled  type 2 diabetes mellitus with hyperglycemia, without long-term current use of insulin (HCC)   Closed  fracture of proximal femur, right, initial encounter (HCC)   AKI (acute kidney injury) (HCC)  Estimated body mass index is 21.87 kg/m as calculated from the following:   Height as of this encounter: 5' (1.524 m).   Weight as of this encounter: 50.8 kg. Advance diet Up with therapy Pain controlled  Vital signs are stable  Acute post op blood loss anemia - Hgb 7.2, transfuse 1 unit of packed red blood cells today  Discharge planning: Patient will follow-up at [facility] clinic orthopedics in 2 weeks for staple removal and x-rays of the right hip.  DVT Prophylaxis - Lovenox, Foot Pumps, and TED hose 25% Weight-Bearing to right leg  T. Cranston Neighbor, PA-C Orthopaedic Surgery 07/24/2023, 7:17 AM

## 2023-07-25 DIAGNOSIS — S72001A Fracture of unspecified part of neck of right femur, initial encounter for closed fracture: Secondary | ICD-10-CM | POA: Diagnosis not present

## 2023-07-25 LAB — BASIC METABOLIC PANEL
Anion gap: 10 (ref 5–15)
BUN: 42 mg/dL — ABNORMAL HIGH (ref 8–23)
CO2: 24 mmol/L (ref 22–32)
Calcium: 9.4 mg/dL (ref 8.9–10.3)
Chloride: 97 mmol/L — ABNORMAL LOW (ref 98–111)
Creatinine, Ser: 0.73 mg/dL (ref 0.44–1.00)
GFR, Estimated: >60 mL/min (ref >60)
Glucose, Bld: 108 mg/dL — ABNORMAL HIGH (ref 70–99)
Potassium: 3.6 mmol/L (ref 3.5–5.1)
Sodium: 131 mmol/L — ABNORMAL LOW (ref 135–145)

## 2023-07-25 LAB — BPAM RBC
Blood Product Expiration Date: 202412152359
ISSUE DATE / TIME: 202411160951
Unit Type and Rh: 5100

## 2023-07-25 LAB — GLUCOSE, CAPILLARY
Glucose-Capillary: 113 mg/dL — ABNORMAL HIGH (ref 70–99)
Glucose-Capillary: 265 mg/dL — ABNORMAL HIGH (ref 70–99)
Glucose-Capillary: 166 mg/dL — ABNORMAL HIGH (ref 70–99)
Glucose-Capillary: 128 mg/dL — ABNORMAL HIGH (ref 70–99)

## 2023-07-25 LAB — CBC
HCT: 23.5 % — ABNORMAL LOW (ref 36.0–46.0)
Hemoglobin: 8.3 g/dL — ABNORMAL LOW (ref 12.0–15.0)
MCH: 29.6 pg (ref 26.0–34.0)
MCHC: 35.3 g/dL (ref 30.0–36.0)
MCV: 83.9 fL (ref 80.0–100.0)
Platelets: 179 10*3/uL (ref 150–400)
RBC: 2.8 MIL/uL — ABNORMAL LOW (ref 3.87–5.11)
RDW: 15.3 % (ref 11.5–15.5)
WBC: 5.7 10*3/uL (ref 4.0–10.5)
nRBC: 0 % (ref 0.0–0.2)

## 2023-07-25 LAB — TYPE AND SCREEN
ABO/RH(D): O NEG
Antibody Screen: NEGATIVE
Unit division: 0

## 2023-07-25 NOTE — Progress Notes (Signed)
PROGRESS NOTE    Tracy Spears   ZOX:[id number] DOB: 09/18/1932  DOA: 07/18/2023 Date of Service: 07/25/23 which is hospital day 6  PCP: Lauro Regulus, MD    HPI: Tracy Spears is an active 89 y.o. female with a history of T2DM, HTN, hyponatremia, gout, nephrolithiasis who presented to the ED on 07/18/2023 after tripping on laundry at home, falling on right hip with significant pain.    Hospital course / significant events:  11/10: subtrochanteric right femur fracture.  11/11: fracture repair  11/12-11/17: awaiting SNF rehab placement   Consultants:  Orthopedics  Procedures/Surgeries: 07/19/23; IM nail R femur fracture repair       ASSESSMENT & PLAN:   Closed right subtrochanteric femur fracture from mechanical fall at home: s/p IM nail 11/11 by Dr. Rosann Auerbach.  Await SNF rehab placement  Continue multimodal pain regimen, attempting to limit narcotics.  Discharge planning: Patient will follow-up at [facility] clinic orthopedics in 2 weeks for staple removal and x-rays of the right hip. DVT Prophylaxis - Lovenox, Foot Pumps, and TED hose 25% Weight-Bearing to right leg   Acute postoperative blood loss anemia:  Monitor CBC   Thrombocytopenia: Mild.  Monitor CBC   HTN Chronic HFpEF: Appears euvolemic HLD CXR with cardiomegaly (AP film), pulmonary vascular congestion though she has no dyspnea, hypoxia, or crackles on exam. She has no respiratory symptoms and is without dyspnea completely supine. Last echo May 2024 showed LVEF 60-65%, no RWMA, grade 2 diastolic dysfunction. Normal RV and no hemodynamically significant valvulopathy. Severe LAE. holding home losartan d/t AKI, low BP Holding home carvedilol d/t low BP Held torsemide given AKI, this can resume as needed  Aspirin Statin    AKI, resolved  Monitor BMP. Avoid nephrotoxins  Holding gabapentin and metformin     Nausea: Abd exam benign but at risk of constipation Start regular bowel  regimen Continue prn antiemetics.    T2DM: HbA1c 7.1% indicating adequate control  Continue SSI   GERD Continue home PPI  Gout: Quiescent.     Normal weight based on BMI: Body mass index is 21.87 kg/m.  Underweight - under 18.5  normal weight - 18.5 to 24.9 overweight - 25 to 29.9 obese - 30 or more   DVT prophylaxis: lovenox IV fluids: no continuous IV fluids  Nutrition: regular Central lines / invasive devices: none  Code Status: FULL CODE ACP documentation reviewed: 07/21/23 and none on file in [facility]  [facility] needs: SNF placement Barriers to dispo / significant pending items: placement for rehab              Subjective / Brief ROS:  Patient reports doing well today Denies CP/SOB.  Pain controlled.    Family Communication: none at this time     Objective Findings:  Vitals:   07/24/23 1400 07/24/23 1544 07/24/23 2304 07/25/23 0801  BP: (!) 116/49 125/60 (!) 112/54 (!) 121/58  Pulse: 75 80 67 70  Resp: 18 15 18 16   Temp: 98.6 F (37 C) 98.3 F (36.8 C) 97.7 F (36.5 C) 98.1 F (36.7 C)  TempSrc: Oral  Oral   SpO2: 97% 98% 98% 97%  Weight:      Height:        Intake/Output Summary (Last 24 hours) at 07/25/2023 1135 Last data filed at 07/24/2023 1400 Gross per 24 hour  Intake 416.33 ml  Output --  Net 416.33 ml   Filed Weights   07/18/23 2354 07/19/23 1310  Weight: 50.8 kg 50.8 kg  Examination:  Physical Exam Constitutional:      General: She is not in acute distress. Cardiovascular:     Rate and Rhythm: Normal rate and regular rhythm.  Pulmonary:     Effort: Pulmonary effort is normal.     Breath sounds: Normal breath sounds.  Musculoskeletal:     Right lower leg: No edema.     Left lower leg: No edema.  Neurological:     General: No focal deficit present.     Mental Status: She is alert and oriented to person, place, and time. Mental status is at baseline.  Psychiatric:        Mood and Affect: Mood normal.         Behavior: Behavior normal.          Scheduled Medications:   aspirin  81 mg Oral Daily   atorvastatin  10 mg Oral Daily   cholecalciferol  2,000 Units Oral Daily   enoxaparin (LOVENOX) injection  30 mg Subcutaneous Q24H   feeding supplement (GLUCERNA SHAKE)  237 mL Oral TID BM   insulin aspart  0-9 Units Subcutaneous TID AC & HS   multivitamin with minerals  1 tablet Oral Daily   pantoprazole  40 mg Oral Daily   polyethylene glycol  17 g Oral Daily   senna-docusate  1 tablet Oral Daily   traMADol  100 mg Oral Q12H    Continuous Infusions:   PRN Medications:  acetaminophen, HYDROcodone-acetaminophen, menthol-cetylpyridinium **OR** phenol, methocarbamol **OR** methocarbamol (ROBAXIN) injection, metoCLOPramide **OR** metoCLOPramide (REGLAN) injection, morphine injection, ondansetron **OR** ondansetron (ZOFRAN) IV, traZODone  Antimicrobials from admission:  Anti-infectives (From admission, onward)    Start     Dose/Rate Route Frequency Ordered Stop   07/19/23 1445  ceFAZolin (ANCEF) IVPB 2g/100 mL premix        2 g 200 mL/hr over 30 Minutes Intravenous  Once 07/19/23 1435 07/19/23 1543   07/19/23 0930  ceFAZolin (ANCEF) IVPB 2g/100 mL premix        2 g 200 mL/hr over 30 Minutes Intravenous To Surgery 07/19/23 0835 07/19/23 1117   07/19/23 0915  ceFAZolin (ANCEF) powder 2 g  Status:  Discontinued        2 g Other To Surgery 07/19/23 0102 07/19/23 0834           Data Reviewed:  I have personally reviewed the following...  CBC: Recent Labs  Lab 07/19/23 0132 07/20/23 0433 07/21/23 0438 07/24/23 0504 07/24/23 1525 07/25/23 0537  WBC 7.2 8.4 10.1 6.3  --  5.7  NEUTROABS 4.1  --   --   --   --   --   HGB 10.1* 8.2* 8.0* 7.2* 9.7* 8.3*  HCT 30.9* 24.8* 23.4* 21.4* 28.2* 23.5*  MCV 88.0 85.2 85.1 85.9  --  83.9  PLT 195 144* 149* 179  --  179   Basic Metabolic Panel: Recent Labs  Lab 07/19/23 0132 07/20/23 0433 07/21/23 0438 07/24/23 0504 07/25/23 0537   NA 133* 133* 131* 128* 131*  K 3.9 3.7 4.4 4.1 3.6  CL 99 101 95* 95* 97*  CO2 27 25 25 24 24   GLUCOSE 220* 137* 188* 148* 108*  BUN 29* 19 25* 49* 42*  CREATININE 1.11* 0.86 1.00 1.04* 0.73  CALCIUM 9.1 8.9 9.2 9.5 9.4   GFR: Estimated Creatinine Clearance: 34.9 mL/min (by C-G formula based on SCr of 0.73 mg/dL). Liver Function Tests: No results for input(s): "AST", "ALT", "ALKPHOS", "BILITOT", "PROT", "ALBUMIN" in the last 168 hours.  No results for input(s): "LIPASE", "AMYLASE" in the last 168 hours. No results for input(s): "AMMONIA" in the last 168 hours. Coagulation Profile: No results for input(s): "INR", "PROTIME" in the last 168 hours. Cardiac Enzymes: No results for input(s): "CKTOTAL", "CKMB", "CKMBINDEX", "TROPONINI" in the last 168 hours. BNP (last 3 results) No results for input(s): "PROBNP" in the last 8760 hours. HbA1C: No results for input(s): "HGBA1C" in the last 72 hours. CBG: Recent Labs  Lab 07/24/23 0740 07/24/23 1157 07/24/23 1641 07/24/23 2029 07/25/23 0752  GLUCAP 132* 253* 159* 162* 113*   Lipid Profile: No results for input(s): "CHOL", "HDL", "LDLCALC", "TRIG", "CHOLHDL", "LDLDIRECT" in the last 72 hours. Thyroid Function Tests: No results for input(s): "TSH", "T4TOTAL", "FREET4", "T3FREE", "THYROIDAB" in the last 72 hours. Anemia Panel: No results for input(s): "VITAMINB12", "FOLATE", "FERRITIN", "TIBC", "IRON", "RETICCTPCT" in the last 72 hours. Most Recent Urinalysis On File:  No results found for: "COLORURINE", "APPEARANCEUR", "LABSPEC", "PHURINE", "GLUCOSEU", "HGBUR", "BILIRUBINUR", "KETONESUR", "PROTEINUR", "UROBILINOGEN", "NITRITE", "LEUKOCYTESUR" Sepsis Labs: @LABRCNTIP (procalcitonin:4,lacticidven:4) Microbiology: No results found for this or any previous visit (from the past 240 hour(s)).    Radiology Studies last 3 days: No results found.      Sunnie Nielsen, DO [facility] 07/25/2023, 11:35 AM    Dictation  software may have been used to generate the above note. Typos may occur and escape review in typed/dictated notes. Please contact Dr Lyn Hollingshead directly for clarity if needed.  Staff may message me via secure chat in Epic  but this may not receive an immediate response,  please page me for urgent matters!  If 7PM-7AM, please contact night coverage [email or web address]

## 2023-07-25 NOTE — Plan of Care (Signed)

## 2023-07-25 NOTE — Progress Notes (Addendum)
Physical Therapy Treatment Patient Details Name: Tracy Spears MRN: [id number] DOB: Apr 06, 1933 Today's Date: 07/25/2023   History of Present Illness Patient is a 89 y.o. female with medical history significant for Diabetes, hypertension hyponatremia, gout, nephrolithiasis, independent in ADLs, being admitted with a right proximal femur fracture from an accidental fall. Current MD assessment: HTN, AKI, HLD, and R proximal femur fracture, now s/p R intramedullary nail.    PT Comments  Pt ready for session.  Uncomfortable in bed.  Transitioned to EOB with mod a x 1.  Steady in sitting and voices need for BSC.  She is able to stand with mod a x 1 and transfers with difficulty to [facility] with mod a x 1 heavy assist to [facility].  Seated AROM on BSC ffor BLE's She reports increased stiffness today.  She has poor descent into chair controlled by Clinical research associate.  She voids and has large sorft BM.  Bathing completed on [facility] and she is able to stand for care with similar assist.  Remained in recliner after session with needs met.   If plan is discharge home, recommend the following: A lot of help with walking and/or transfers;A lot of help with bathing/dressing/bathroom;Assistance with cooking/housework;Direct supervision/assist for medications management;Direct supervision/assist for financial management;Assist for transportation;Help with stairs or ramp for entrance   Can travel by private vehicle        Equipment Recommendations       Recommendations for Other Services       Precautions / Restrictions Precautions Precautions: Fall Restrictions Weight Bearing Restrictions: Yes RUE Weight Bearing: Partial weight bearing RUE Partial Weight Bearing Percentage or Pounds: 25% on RLE RLE Weight Bearing: Partial weight bearing RLE Partial Weight Bearing Percentage or Pounds: 25     Mobility  Bed Mobility Overal bed mobility: Needs Assistance Bed Mobility: Supine to Sit     Supine to sit: HOB elevated, Mod  assist       Patient Response: Cooperative  Transfers Overall transfer level: Needs assistance Equipment used: Rolling walker (2 wheels) Transfers: Sit to/from Stand Sit to Stand: Mod assist                Ambulation/Gait Ambulation/Gait assistance: Mod assist Gait Distance (Feet): 3 Feet Assistive device: Rolling walker (2 wheels)   Gait velocity: decreased     General Gait Details: struggled today with c/o increased stiffness   Stairs             Wheelchair Mobility     Tilt Bed Tilt Bed Patient Response: Cooperative  Modified Rankin (Stroke Patients Only)       Balance Overall balance assessment: Needs assistance Sitting-balance support: Bilateral upper extremity supported, Feet supported Sitting balance-Leahy Scale: Fair     Standing balance support: Bilateral upper extremity supported, During functional activity, Reliant on assistive device for balance Standing balance-Leahy Scale: Poor Standing balance comment: reliant on RW with BUE support for all standing activity                            Cognition Arousal: Alert Behavior During Therapy: WFL for tasks assessed/performed Overall Cognitive Status: Within Functional Limits for tasks assessed                                          Exercises Other Exercises Other Exercises: to [facility] to void and BM along  with bath    General Comments        Pertinent Vitals/Pain Pain Assessment Pain Assessment: Faces Faces Pain Scale: Hurts even more Pain Location: R hip Pain Descriptors / Indicators: Aching, Guarding, Sore Pain Intervention(s): Limited activity within patient's tolerance, Monitored during session, Repositioned    Home Living                          Prior Function            PT Goals (current goals can now be found in the care plan section) Progress towards PT goals: Progressing toward goals    Frequency    7X/week      PT  Plan      Co-evaluation              AM-PAC PT "6 Clicks" Mobility   Outcome Measure  Help needed turning from your back to your side while in a flat bed without using bedrails?: A Little Help needed moving from lying on your back to sitting on the side of a flat bed without using bedrails?: A Lot Help needed moving to and from a bed to a chair (including a wheelchair)?: A Lot Help needed standing up from a chair using your arms (e.g., wheelchair or bedside chair)?: A Lot Help needed to walk in hospital room?: A Lot Help needed climbing 3-5 steps with a railing? : Total 6 Click Score: 12    End of Session Equipment Utilized During Treatment: Gait belt Activity Tolerance: Patient tolerated treatment well;Other (comment) Patient left: in chair;with call bell/phone within reach;with chair alarm set Nurse Communication: Mobility status PT Visit Diagnosis: Pain;Other abnormalities of gait and mobility (R26.89);Difficulty in walking, not elsewhere classified (R26.2);Muscle weakness (generalized) (M62.81) Pain - Right/Left: Right Pain - part of body: Hip     Time: 1136-1207 PT Time Calculation (min) (ACUTE ONLY): 31 min  Charges:    $Therapeutic Exercise: 8-22 mins $Therapeutic Activity: 8-22 mins PT General Charges $$ ACUTE PT VISIT: 1 Visit                   Danielle Dess, PTA 07/25/23, 1:22 PM

## 2023-07-26 DIAGNOSIS — I10 Essential (primary) hypertension: Secondary | ICD-10-CM | POA: Diagnosis not present

## 2023-07-26 DIAGNOSIS — Z741 Need for assistance with personal care: Secondary | ICD-10-CM | POA: Diagnosis not present

## 2023-07-26 DIAGNOSIS — M109 Gout, unspecified: Secondary | ICD-10-CM | POA: Diagnosis not present

## 2023-07-26 DIAGNOSIS — K5900 Constipation, unspecified: Secondary | ICD-10-CM | POA: Diagnosis not present

## 2023-07-26 DIAGNOSIS — M1611 Unilateral primary osteoarthritis, right hip: Secondary | ICD-10-CM | POA: Diagnosis not present

## 2023-07-26 DIAGNOSIS — R52 Pain, unspecified: Secondary | ICD-10-CM | POA: Diagnosis not present

## 2023-07-26 DIAGNOSIS — E569 Vitamin deficiency, unspecified: Secondary | ICD-10-CM | POA: Diagnosis not present

## 2023-07-26 DIAGNOSIS — K523 Indeterminate colitis: Secondary | ICD-10-CM | POA: Diagnosis not present

## 2023-07-26 DIAGNOSIS — T699XXA Effect of reduced temperature, unspecified, initial encounter: Secondary | ICD-10-CM | POA: Diagnosis not present

## 2023-07-26 DIAGNOSIS — S32592D Other specified fracture of left pubis, subsequent encounter for fracture with routine healing: Secondary | ICD-10-CM | POA: Diagnosis not present

## 2023-07-26 DIAGNOSIS — G4700 Insomnia, unspecified: Secondary | ICD-10-CM | POA: Diagnosis not present

## 2023-07-26 DIAGNOSIS — E785 Hyperlipidemia, unspecified: Secondary | ICD-10-CM | POA: Diagnosis not present

## 2023-07-26 DIAGNOSIS — E559 Vitamin D deficiency, unspecified: Secondary | ICD-10-CM | POA: Diagnosis not present

## 2023-07-26 DIAGNOSIS — S7224XD Nondisplaced subtrochanteric fracture of right femur, subsequent encounter for closed fracture with routine healing: Secondary | ICD-10-CM | POA: Diagnosis not present

## 2023-07-26 DIAGNOSIS — K219 Gastro-esophageal reflux disease without esophagitis: Secondary | ICD-10-CM | POA: Diagnosis not present

## 2023-07-26 DIAGNOSIS — R600 Localized edema: Secondary | ICD-10-CM | POA: Diagnosis not present

## 2023-07-26 DIAGNOSIS — S7221XA Displaced subtrochanteric fracture of right femur, initial encounter for closed fracture: Secondary | ICD-10-CM | POA: Diagnosis not present

## 2023-07-26 DIAGNOSIS — M167 Other unilateral secondary osteoarthritis of hip: Secondary | ICD-10-CM | POA: Diagnosis not present

## 2023-07-26 DIAGNOSIS — Z418 Encounter for other procedures for purposes other than remedying health state: Secondary | ICD-10-CM | POA: Diagnosis not present

## 2023-07-26 DIAGNOSIS — R2681 Unsteadiness on feet: Secondary | ICD-10-CM | POA: Diagnosis not present

## 2023-07-26 DIAGNOSIS — R110 Nausea: Secondary | ICD-10-CM | POA: Diagnosis not present

## 2023-07-26 DIAGNOSIS — M6259 Muscle wasting and atrophy, not elsewhere classified, multiple sites: Secondary | ICD-10-CM | POA: Diagnosis not present

## 2023-07-26 DIAGNOSIS — E119 Type 2 diabetes mellitus without complications: Secondary | ICD-10-CM | POA: Diagnosis not present

## 2023-07-26 DIAGNOSIS — J309 Allergic rhinitis, unspecified: Secondary | ICD-10-CM | POA: Diagnosis not present

## 2023-07-26 DIAGNOSIS — R279 Unspecified lack of coordination: Secondary | ICD-10-CM | POA: Diagnosis not present

## 2023-07-26 DIAGNOSIS — D62 Acute posthemorrhagic anemia: Secondary | ICD-10-CM | POA: Diagnosis not present

## 2023-07-26 DIAGNOSIS — Z743 Need for continuous supervision: Secondary | ICD-10-CM | POA: Diagnosis not present

## 2023-07-26 DIAGNOSIS — S72001A Fracture of unspecified part of neck of right femur, initial encounter for closed fracture: Secondary | ICD-10-CM | POA: Diagnosis not present

## 2023-07-26 DIAGNOSIS — I5032 Chronic diastolic (congestive) heart failure: Secondary | ICD-10-CM | POA: Diagnosis not present

## 2023-07-26 LAB — CREATININE, SERUM
Creatinine, Ser: 0.68 mg/dL (ref 0.44–1.00)
GFR, Estimated: >60 mL/min (ref >60)

## 2023-07-26 LAB — GLUCOSE, CAPILLARY
Glucose-Capillary: 137 mg/dL — ABNORMAL HIGH (ref 70–99)
Glucose-Capillary: 243 mg/dL — ABNORMAL HIGH (ref 70–99)

## 2023-07-26 MED ORDER — ENOXAPARIN SODIUM 40 MG/0.4ML IJ SOSY: 40.0000 mg | PREFILLED_SYRINGE | INTRAMUSCULAR | Status: DC

## 2023-07-26 MED ORDER — POLYETHYLENE GLYCOL 3350 17 G PO PACK: 17.0000 g | PACK | Freq: Every day | ORAL | Status: AC

## 2023-07-26 MED ORDER — METFORMIN HCL ER 500 MG PO TB24: 1000.0000 mg | ORAL_TABLET | Freq: Every evening | ORAL | Status: DC

## 2023-07-26 NOTE — Progress Notes (Signed)
Physical Therapy Treatment Patient Details Name: Tracy Spears MRN: [id number] DOB: Dec 12, 1932 Today's Date: 07/26/2023   History of Present Illness Patient is a 89 y.o. female with medical history significant for Diabetes, hypertension hyponatremia, gout, nephrolithiasis, independent in ADLs, being admitted with a right proximal femur fracture from an accidental fall. Current MD assessment: HTN, AKI, HLD, and R proximal femur fracture, now s/p R intramedullary nail.    PT Comments  Pt in chair, needs to use bathoom.  Co-tx with OT with 1 unit billed each.  She is able to stand with min a x 2 and does walk 8' towards [facility] but despite cues and assist she struggles with maintaining 25% WB limitations so gait is limited. She does take extended time on [facility] where she does do seated AROM BLE.  She needs full assist for care after voiding as she is unable to stand with 1 arm support.  Returned to recliner after session.     If plan is discharge home, recommend the following: A lot of help with walking and/or transfers;A lot of help with bathing/dressing/bathroom;Assistance with cooking/housework;Direct supervision/assist for medications management;Direct supervision/assist for financial management;Assist for transportation;Help with stairs or ramp for entrance   Can travel by private vehicle        Equipment Recommendations       Recommendations for Other Services       Precautions / Restrictions Precautions Precautions: Fall Restrictions Weight Bearing Restrictions: Yes RUE Weight Bearing: Partial weight bearing RUE Partial Weight Bearing Percentage or Pounds: 25% on RLE RLE Weight Bearing: Partial weight bearing RLE Partial Weight Bearing Percentage or Pounds: 25     Mobility  Bed Mobility               General bed mobility comments: NT in recliner at start adn end of session Patient Response: Cooperative  Transfers Overall transfer level: Needs assistance Equipment used:  Rolling walker (2 wheels) Transfers: Sit to/from Stand Sit to Stand: Min assist, +2 physical assistance                Ambulation/Gait Ambulation/Gait assistance: Min assist, +2 physical assistance Gait Distance (Feet): 8 Feet Assistive device: Rolling walker (2 wheels) Gait Pattern/deviations: Decreased step length - right, Decreased step length - left, Decreased weight shift to right, Decreased stance time - right, Step-to pattern, Antalgic Gait velocity: decreased     General Gait Details: struggled with PWB so gait is limtied   Optometrist     Tilt Bed Tilt Bed Patient Response: Cooperative  Modified Rankin (Stroke Patients Only)       Balance Overall balance assessment: Needs assistance Sitting-balance support: No upper extremity supported, Feet supported Sitting balance-Leahy Scale: Fair         Standing balance comment: reliant on RW with BUE support for all standing activity                            Cognition Arousal: Alert Behavior During Therapy: WFL for tasks assessed/performed Overall Cognitive Status: Within Functional Limits for tasks assessed                                          Exercises Other Exercises Other Exercises: BSC to void but unable to have BM  General Comments        Pertinent Vitals/Pain Pain Assessment Pain Assessment: No/denies pain Faces Pain Scale: Hurts little more Pain Location: R hip Pain Descriptors / Indicators: Aching, Guarding, Sore Pain Intervention(s): Monitored during session, Premedicated before session, Repositioned    Home Living                          Prior Function            PT Goals (current goals can now be found in the care plan section) Progress towards PT goals: Progressing toward goals    Frequency    7X/week      PT Plan      Co-evaluation PT/OT/SLP Co-Evaluation/Treatment: Yes Reason for  Co-Treatment: To address functional/ADL transfers PT goals addressed during session: Mobility/safety with mobility OT goals addressed during session: ADL's and self-care      AM-PAC PT "6 Clicks" Mobility   Outcome Measure  Help needed turning from your back to your side while in a flat bed without using bedrails?: A Little Help needed moving from lying on your back to sitting on the side of a flat bed without using bedrails?: A Lot Help needed moving to and from a bed to a chair (including a wheelchair)?: A Lot Help needed standing up from a chair using your arms (e.g., wheelchair or bedside chair)?: A Lot Help needed to walk in hospital room?: A Lot Help needed climbing 3-5 steps with a railing? : Total 6 Click Score: 12    End of Session Equipment Utilized During Treatment: Gait belt Activity Tolerance: Patient tolerated treatment well;Other (comment) Patient left: in chair;with call bell/phone within reach;with chair alarm set;with family/visitor present Nurse Communication: Mobility status PT Visit Diagnosis: Pain;Other abnormalities of gait and mobility (R26.89);Difficulty in walking, not elsewhere classified (R26.2);Muscle weakness (generalized) (M62.81) Pain - Right/Left: Right Pain - part of body: Hip     Time: 5188-4166 PT Time Calculation (min) (ACUTE ONLY): 24 min  Charges:    $Gait Training: 8-22 mins PT General Charges $$ ACUTE PT VISIT: 1 Visit                   Danielle Dess, PTA 07/26/23, 12:31 PM

## 2023-07-26 NOTE — Progress Notes (Signed)
Occupational Therapy Treatment Patient Details Name: Tracy Spears MRN: [id number] DOB: 1932-12-06 Today's Date: 07/26/2023   History of present illness Patient is a 89 y.o. female with medical history significant for Diabetes, hypertension hyponatremia, gout, nephrolithiasis, independent in ADLs, being admitted with a right proximal femur fracture from an accidental fall. Current MD assessment: HTN, AKI, HLD, and R proximal femur fracture, now s/p R intramedullary nail.   OT comments  Pt seen for OT tx and co-tx with PT to optimize ADL/mobility. Pt received in the recliner, agreeable to session. Pt required MIN A +2 for STS from recliner and to take several steps to the [facility] with questionable compliance of 25% PWBing to RLE. Min A for thoroughness to comb out tangles and put her hair in a clip while she completed toileting task. MODA  +2 for STS from the [facility] with VC for hand placement and MAX A for pericare in standing as pt required BUE support on the RW to maintain static standing balance. Pt progressing towards goals. Continues to benefit from skilled OT services.       If plan is discharge home, recommend the following:  A lot of help with walking and/or transfers;A lot of help with bathing/dressing/bathroom;Assistance with cooking/housework;Assist for transportation;Help with stairs or ramp for entrance   Equipment Recommendations  Other (comment);BSC/3in1 (defer)    Recommendations for Other Services      Precautions / Restrictions Precautions Precautions: Fall Restrictions Weight Bearing Restrictions: Yes RUE Weight Bearing: Partial weight bearing RUE Partial Weight Bearing Percentage or Pounds: 25 RLE Weight Bearing: Partial weight bearing RLE Partial Weight Bearing Percentage or Pounds: 25       Mobility Bed Mobility               General bed mobility comments: NT in recliner at start adn end of session    Transfers Overall transfer level: Needs  assistance Equipment used: Rolling walker (2 wheels) Transfers: Sit to/from Stand Sit to Stand: Min assist, +2 physical assistance                 Balance Overall balance assessment: Needs assistance Sitting-balance support: No upper extremity supported, Feet supported Sitting balance-Leahy Scale: Fair     Standing balance support: Bilateral upper extremity supported, During functional activity, Reliant on assistive device for balance Standing balance-Leahy Scale: Poor                             ADL either performed or assessed with clinical judgement   ADL Overall ADL's : Needs assistance/impaired     Grooming: Sitting;Brushing hair Grooming Details (indicate cue type and reason): MIN A for thoroughness to comb tangles from the back crown of her head                 Toilet Transfer: BSC/3in1;Rolling walker (2 wheels);Ambulation;Minimal assistance;+2 for physical assistance Toilet Transfer Details (indicate cue type and reason): VC for PWBing Toileting- Clothing Manipulation and Hygiene: Sit to/from stand              Extremity/Trunk Assessment              Vision       Perception     Praxis      Cognition Arousal: Alert Behavior During Therapy: WFL for tasks assessed/performed Overall Cognitive Status: Within Functional Limits for tasks assessed  Exercises      Shoulder Instructions       General Comments      Pertinent Vitals/ Pain       Pain Assessment Pain Assessment: Faces Faces Pain Scale: Hurts little more Pain Location: R hip Pain Descriptors / Indicators: Aching, Guarding, Sore Pain Intervention(s): Monitored during session, Premedicated before session, Repositioned  Home Living                                          Prior Functioning/Environment              Frequency  Min 1X/week        Progress Toward Goals  OT  Goals(current goals can now be found in the care plan section)  Progress towards OT goals: Progressing toward goals  Acute Rehab OT Goals Patient Stated Goal: to get better OT Goal Formulation: With patient/family Time For Goal Achievement: 08/03/23 Potential to Achieve Goals: Good  Plan      Co-evaluation    PT/OT/SLP Co-Evaluation/Treatment: Yes Reason for Co-Treatment: To address functional/ADL transfers PT goals addressed during session: Mobility/safety with mobility OT goals addressed during session: ADL's and self-care      AM-PAC OT "6 Clicks" Daily Activity     Outcome Measure   Help from another person eating meals?: None Help from another person taking care of personal grooming?: A Little Help from another person toileting, which includes using toliet, bedpan, or urinal?: A Lot Help from another person bathing (including washing, rinsing, drying)?: A Lot Help from another person to put on and taking off regular upper body clothing?: A Little Help from another person to put on and taking off regular lower body clothing?: A Lot 6 Click Score: 16    End of Session Equipment Utilized During Treatment: Gait belt;Rolling walker (2 wheels)  OT Visit Diagnosis: Other abnormalities of gait and mobility (R26.89);History of falling (Z91.81);Muscle weakness (generalized) (M62.81);Pain Pain - Right/Left: Right Pain - part of body: Hip   Activity Tolerance Patient tolerated treatment well   Patient Left in chair;with call bell/phone within reach;with chair alarm set   Nurse Communication Mobility status        Time: 1610-9604 OT Time Calculation (min): 24 min  Charges: OT General Charges $OT Visit: 1 Visit OT Treatments $Self Care/Home Management : 8-22 mins  Arman Filter., MPH, MS, OTR/L ascom [phone] 07/26/23, 10:35 AM

## 2023-07-26 NOTE — Plan of Care (Signed)
  Problem: Education: Goal: Ability to describe self-care measures that may prevent or decrease complications (Diabetes Survival Skills Education) will improve Outcome: Progressing Goal: Individualized Educational Video(s) Outcome: Progressing   Problem: Coping: Goal: Ability to adjust to condition or change in health will improve Outcome: Progressing   Problem: Fluid Volume: Goal: Ability to maintain a balanced intake and output will improve Outcome: Progressing   Problem: Health Behavior/Discharge Planning: Goal: Ability to identify and utilize available resources and services will improve Outcome: Progressing Goal: Ability to manage health-related needs will improve Outcome: Progressing   Problem: Metabolic: Goal: Ability to maintain appropriate glucose levels will improve Outcome: Progressing   Problem: Nutritional: Goal: Maintenance of adequate nutrition will improve Outcome: Progressing   Problem: Education: Goal: Knowledge of General Education information will improve Description: Including pain rating scale, medication(s)/side effects and non-pharmacologic comfort measures Outcome: Progressing   Problem: Clinical Measurements: Goal: Will remain free from infection Outcome: Progressing Goal: Diagnostic test results will improve Outcome: Progressing   Problem: Activity: Goal: Risk for activity intolerance will decrease Outcome: Progressing

## 2023-07-26 NOTE — Consult Note (Signed)
[facility] Liaison Note  07/26/2023  Tracy Spears 1933/07/27 [id number]  Location: RN Hospital Liaison screened the patient remotely at [facility].  Insurance: [employer]   Tracy Spears is a 89 y.o. female who is a Primary Care Patient of Lauro Regulus, MD-[facility]. The patient was screened for readmission hospitalization with noted medium risk score for unplanned readmission risk with 2 IP in 6 months.  The patient was assessed for potential Care Management service needs for post hospital transition for care coordination. Review of patient's electronic medical record reveals patient was admitted with Hip Fracture. Pt will discharged to SNF level of care for ongoing STR. Facility will continue to address spt's ongoing needs.Marland Kitchen   [facility] Care Management/Population Health does not replace or interfere with any arrangements made by the Inpatient Transition of Care team.   For questions contact:   Tracy Cousin, RN, [facility] Liaison [place]   [facility], Population Health Office Hours MTWF  8:00 am-6:00 pm Direct Dial: [phone] mobile [phone] [Office toll free line] Office Hours are M-F 8:30 - 5 pm [email or web address]

## 2023-07-26 NOTE — TOC Transition Note (Signed)
Transition of Care [facility]) - CM/SW Discharge Note   Patient Details  Name: Tracy Spears MRN: [id number] Date of Birth: June 16, 1933  Transition of Care [facility]) CM/SW Contact:  Garret Reddish, RN Phone Number: 07/26/2023, 11:43 AM   Clinical Narrative:    Chart reviewed.  Noted that patient has orders for discharge for today.    I have spoken with [facility] with [employer].  She has informed me that patient has been approved for SNF.  She has approved patient for 7 days.  Authorization number is [id number].  She informs me that patient has also been approved for ambulance authorization.  I have spoken with Tammy with [facility].  She informs me that patient she will have a bed for the patient today. She informs me that patient will go to room 706 and the number to call report is [phone].  I have sent [facility] patient's SNF transfer Packet, Discharge Summary and Discharge orders via Epic hub.   I have informed patient and her husband that [facility] will have a bed for patient today.  I have also informed patient and her husband that [facility] will transport patient to the facility today.    I have informed staff nurse of the above information.     Final next level of care: Skilled Nursing Facility Barriers to Discharge: No Barriers Identified   Patient Goals and CMS Choice CMS Medicare.gov Compare Post Acute Care list provided to:: Patient Choice offered to / list presented to : Patient  Discharge Placement                  Patient to be transferred to facility by: [facility] Name of family member notified: Mr. Handwerk Patient and family notified of of transfer: 07/26/23  Discharge Plan and Services Additional resources added to the After Visit Summary for                                       Social Determinants of Health (SDOH) Interventions SDOH Screenings   Food Insecurity: No Food Insecurity (01/21/2023)  Housing: Low Risk   (01/21/2023)  Transportation Needs: No Transportation Needs (01/21/2023)  Utilities: Not At Risk (01/21/2023)  Tobacco Use: Low Risk  (07/19/2023)     Readmission Risk Interventions     No data to display

## 2023-07-26 NOTE — Progress Notes (Signed)
[facility] called nurse back. Report given.

## 2023-07-26 NOTE — Care Management Important Message (Signed)
Important Message  Patient Details  Name: Tracy Spears MRN: [id number] Date of Birth: 1932-12-21   Important Message Given:  Yes - Medicare IM     [name] A Nas Wafer 07/26/2023, 12:17 PM

## 2023-07-26 NOTE — Progress Notes (Signed)
Called over to [facility] and attempted to give report. Nurse noted the call be disconnected X2 upon asking to speak to nursing to give report to patient going into room 706.

## 2023-07-26 NOTE — Progress Notes (Signed)
  Subjective: 7 Days Post-Op Procedure(s) (LRB): INTRAMEDULLARY (IM) NAIL INTERTROCHANTERIC (Right) Patient reports pain as mild.   Patient is well, and has had no acute complaints or problems Plan is to go Rehab after hospital stay. Negative for chest pain and shortness of breath Fever: no Gastrointestinal: Negative for nausea and vomiting  Objective: Vital signs in last 24 hours: Temp:  [97.9 F (36.6 C)-98.2 F (36.8 C)] 98.1 F (36.7 C) (11/18 0755) Pulse Rate:  [62-68] 64 (11/18 0755) Resp:  [16-20] 16 (11/18 0755) BP: (120-144)/(49-71) 128/71 (11/18 0755) SpO2:  [95 %-99 %] 95 % (11/18 0755)  Intake/Output from previous day: No intake or output data in the 24 hours ending 07/26/23 0944   Intake/Output this shift: No intake/output data recorded.  Labs: Recent Labs    07/24/23 0504 07/24/23 1525 07/25/23 0537  HGB 7.2* 9.7* 8.3*   Recent Labs    07/24/23 0504 07/24/23 1525 07/25/23 0537  WBC 6.3  --  5.7  RBC 2.49*  --  2.80*  HCT 21.4* 28.2* 23.5*  PLT 179  --  179   Recent Labs    07/24/23 0504 07/25/23 0537 07/26/23 0443  NA 128* 131*  --   K 4.1 3.6  --   CL 95* 97*  --   CO2 24 24  --   BUN 49* 42*  --   CREATININE 1.04* 0.73 0.68  GLUCOSE 148* 108*  --   CALCIUM 9.5 9.4  --    No results for input(s): "LABPT", "INR" in the last 72 hours.   EXAM General - Patient is Alert and Oriented Extremity - Neurovascular intact Sensation intact distally Dorsiflexion/Plantar flexion intact Compartment soft Dressing/Incision -bulky dressing intact.  Clean, dry, no drainage Motor Function - intact, moving foot and toes well on exam.     Past Medical History:  Diagnosis Date   Actinic keratosis    Basal cell carcinoma 04/07/2010   Right paranasal.    Diabetes mellitus without complication (HCC)     Assessment/Plan: 7 Days Post-Op Procedure(s) (LRB): INTRAMEDULLARY (IM) NAIL INTERTROCHANTERIC (Right) Principal Problem:   Hip fracture  requiring operative repair, right, closed, initial encounter [facility]) Active Problems:   Essential hypertension   Uncontrolled type 2 diabetes mellitus with hyperglycemia, without long-term current use of insulin (HCC)   Closed  fracture of proximal femur, right, initial encounter (HCC)   AKI (acute kidney injury) (HCC)  Estimated body mass index is 21.87 kg/m as calculated from the following:   Height as of this encounter: 5' (1.524 m).   Weight as of this encounter: 50.8 kg. Advance diet Up with therapy Pain controlled  Vital signs are stable  Acute post op blood loss anemia - Hgb 8.3, s/p 1 unit PRBC 11/16.  Discharge planning: Patient will follow-up at [facility] clinic orthopedics in 2 weeks for staple removal and x-rays of the right hip.  DVT Prophylaxis - Lovenox, Foot Pumps, and TED hose 25% Weight-Bearing to right leg  T. Cranston Neighbor, PA-C Orthopaedic Surgery 07/26/2023, 9:44 AM

## 2023-07-26 NOTE — Discharge Summary (Signed)
Tracy Spears:[id number] DOB: 07/09/1933 DOA: 07/18/2023  PCP: Tracy Regulus, MD  Admit date: 07/18/2023 Discharge date: 07/26/2023  Time spent: 35 minutes  Recommendations for Outpatient Follow-up:  Ortho f/u ~2 weeks, need to call and schedule PCP f/u post-rehab, consider re-initiating treatment of osteoporosis  Resume home antihypertensives and diuretic as need and as bp allows    Discharge Diagnoses:  Principal Problem:   Hip fracture requiring operative repair, right, closed, initial encounter [facility] Spe[facility]) Active Problems:   Closed  fracture of proximal femur, right, initial encounter (HCC)   Uncontrolled type 2 diabetes mellitus with hyperglycemia, without long-term current use of insulin (HCC)   AKI (acute kidney injury) (HCC)   Essential hypertension   Discharge Condition: stable  Diet recommendation: heart healthy carb modified  Filed Weights   07/18/23 2354 07/19/23 1310  Weight: 50.8 kg 50.8 kg    History of present illness:  From admission h and p Tracy Spears is a 89 y.o. female with medical history significant for Diabetes, hypertension hyponatremia, gout, nephrolithiasis, independent in ADLs, being admitted with a right proximal femur fracture from an accidental fall when she tripped over something on the floor while doing her laundry.  She was previously in her usual state of health and denied preceding lightheadedness, blurred vision, headache, chest pain, shortness of breath or palpitations.  She denied hitting her head or losing consciousness. ED course and data review: Mild bradycardia to 58 with otherwise normal vitals  Hospital Course:  Closed right subtrochanteric femur fracture from mechanical fall at home: s/p IM nail 11/11 by Dr. Rosann Spears.  Discharge planning: Patient will follow-up at [facility] orthopedics in 2 weeks for staple removal and x-rays of the right hip. DVT Prophylaxis - Lovenox 25% Weight-Bearing to right  leg  Osteoporosis Says previously treated but has been off treatment for some time, advised pcp f/u to consider re-starting given this is a fragility fall   Acute postoperative blood loss anemia:  Required one unit on 11/16 for hgb of 7.2, hgb the subsequent 2 days appropriate.   Thrombocytopenia: Mild.  Monitor CBC   HTN Chronic HFpEF: Appears euvolemic HLD CXR with cardiomegaly (AP film), pulmonary vascular congestion though she has no dyspnea, hypoxia, or crackles on exam. She has no respiratory symptoms and is without dyspnea completely supine. Last echo May 2024 showed LVEF 60-65%, no RWMA, grade 2 diastolic dysfunction. Normal RV and no hemodynamically significant valvulopathy. Severe LAE. holding home losartan d/t AKI, low BP Holding home carvedilol d/t low BP Held torsemide given AKI, this can resume as needed  Holding aspirin given lovenox on d/c Statin    AKI, resolved  Monitor BMP. Avoid nephrotoxins  Holding gabapentin and metformin      T2DM: HbA1c 7.1% indicating adequate control  Can resume metformin at discharge but will reduce dose to 1000 given gfr   Procedures: INTRAMEDULLARY (IM) NAIL INTERTROCHANTERIC, right (07/19/23)  Consultations: orthopedics  Discharge Exam: Vitals:   07/26/23 0044 07/26/23 0755  BP: (!) 144/61 128/71  Pulse: 68 64  Resp: 20 16  Temp: 97.9 F (36.6 C) 98.1 F (36.7 C)  SpO2: 96% 95%    General: NAD Cardiovascular: RRR Respiratory: CTAB Skin: bandage right lateral hip c/d/i  Discharge Instructions   Discharge Instructions     Diet - low sodium heart healthy   Complete by: As directed    Increase activity slowly   Complete by: As directed       Allergies as of 07/26/2023  Reactions   Lisinopril Other (See Comments)   cough        Medication List     STOP taking these medications    aspirin 81 MG chewable tablet   carvedilol 25 MG tablet Commonly known as: COREG   losartan 100 MG  tablet Commonly known as: COZAAR   potassium chloride 10 MEQ tablet Commonly known as: KLOR-CON   torsemide 20 MG tablet Commonly known as: DEMADEX       TAKE these medications    atorvastatin 10 MG tablet Commonly known as: LIPITOR Take 1 tablet by mouth daily.   azelastine 0.1 % nasal spray Commonly known as: ASTELIN Place 2 sprays into both nostrils daily as needed for allergies or rhinitis.   Cholecalciferol 50 MCG (2000 UT) Caps Take 1 capsule by mouth daily.   colchicine 0.6 MG tablet Take 0.6 mg by mouth daily as needed.   diclofenac Sodium 1 % Gel Commonly known as: Voltaren Apply 2 g topically 3 (three) times daily as needed.   enoxaparin 40 MG/0.4ML injection Commonly known as: LOVENOX Inject 0.4 mLs (40 mg total) into the skin daily for 28 days.   ferrous sulfate 325 (65 FE) MG tablet Take 325 mg by mouth daily with breakfast.   gabapentin 300 MG capsule Commonly known as: NEURONTIN Take 1 capsule by mouth 3 (three) times daily.   HYDROcodone-acetaminophen 5-325 MG tablet Commonly known as: NORCO/VICODIN Take 1-2 tablets by mouth every 4 (four) hours as needed for moderate pain (pain score 4-6).   metFORMIN 500 MG 24 hr tablet Commonly known as: GLUCOPHAGE-XR Take 1,500 mg by mouth at bedtime.   Multi-Vitamin tablet Take 1 tablet by mouth daily.   pantoprazole 40 MG tablet Commonly known as: PROTONIX Take 1 tablet by mouth daily.   polyethylene glycol 17 g packet Commonly known as: MIRALAX / GLYCOLAX Take 17 g by mouth daily. Start taking on: July 27, 2023   traMADol 50 MG tablet Commonly known as: ULTRAM Take 2 tablets (100 mg total) by mouth every 12 (twelve) hours.   traZODone 50 MG tablet Commonly known as: DESYREL Take 1 tablet by mouth at bedtime as needed.       Allergies  Allergen Reactions   Lisinopril Other (See Comments)    cough    Follow-up Information     Tracy Spears, [place]. Schedule an appointment as soon as  possible for a visit in 2 week(s).   Specialty: Orthopedic Surgery Why: For x-rays of the right hip and staple removal Contact information: [address] [place] [address] [phone]                  The results of significant diagnostics from this hospitalization (including imaging, microbiology, ancillary and laboratory) are listed below for reference.    Significant Diagnostic Studies: DG HIP UNILAT WITH PELVIS 2-3 VIEWS RIGHT  Result Date: 07/19/2023 CLINICAL DATA:  Elective surgery. EXAM: DG HIP (WITH OR WITHOUT PELVIS) 2-3V RIGHT COMPARISON:  Preoperative imaging. FINDINGS: Five fluoroscopic spot views of the right hip and femur obtained in the operating room. Femoral intramedullary nail with trans trochanteric and distal locking screw fixation traversing comminuted proximal femur fracture. Fluoroscopy time 2 minutes 28 seconds. Dose 19.54 mGy. IMPRESSION: Intraoperative fluoroscopy during proximal femur fracture fixation. Electronically Signed   By: Narda Rutherford M.D.   On: 07/19/2023 19:01   DG C-Arm 1-60 Min-No Report  Result Date: 07/19/2023 Fluoroscopy was utilized by the requesting physician.  No radiographic interpretation.   DG C-Arm 1-60 Min-No Report  Result Date: 07/19/2023 Fluoroscopy was utilized by the requesting physician.  No radiographic interpretation.   DG Chest Portable 1 View  Result Date: 07/19/2023 CLINICAL DATA:  Preop for hip fracture. EXAM: PORTABLE CHEST 1 VIEW COMPARISON:  01/20/2023 FINDINGS: Stable cardiomegaly. Aortic atherosclerotic calcification. Pulmonary vascular congestion. No focal consolidation, pleural effusion, or pneumothorax. No displaced rib fractures. IMPRESSION: Cardiomegaly with pulmonary vascular congestion, similar to prior. Electronically Signed   By: Minerva Fester M.D.   On: 07/19/2023 00:46   DG Hip Unilat W or Wo Pelvis 2-3 Views Right  Result Date: 07/19/2023 CLINICAL DATA:   Fall with right hip pain. EXAM: DG HIP (WITH OR WITHOUT PELVIS) 2-3V RIGHT COMPARISON:  12/13/2020 FINDINGS: Acute displaced fracture of the proximal right femur. The fracture line courses obliquely from the lesser trochanter inferolaterally into the femoral diaphysis. There is apex lateral angulation. No dislocation of the femoral head. Degenerative changes pubic symphysis, both hips, SI joints and lower lumbar spine. Demineralization. IMPRESSION: Acute displaced angulated fracture of the proximal right femur. Electronically Signed   By: Minerva Fester M.D.   On: 07/19/2023 00:45    Microbiology: No results found for this or any previous visit (from the past 240 hour(s)).   Labs: Basic Metabolic Panel: Recent Labs  Lab 07/20/23 0433 07/21/23 0438 07/24/23 0504 07/25/23 0537 07/26/23 0443  NA 133* 131* 128* 131*  --   K 3.7 4.4 4.1 3.6  --   CL 101 95* 95* 97*  --   CO2 25 25 24 24   --   GLUCOSE 137* 188* 148* 108*  --   BUN 19 25* 49* 42*  --   CREATININE 0.86 1.00 1.04* 0.73 0.68  CALCIUM 8.9 9.2 9.5 9.4  --    Liver Function Tests: No results for input(s): "AST", "ALT", "ALKPHOS", "BILITOT", "PROT", "ALBUMIN" in the last 168 hours. No results for input(s): "LIPASE", "AMYLASE" in the last 168 hours. No results for input(s): "AMMONIA" in the last 168 hours. CBC: Recent Labs  Lab 07/20/23 0433 07/21/23 0438 07/24/23 0504 07/24/23 1525 07/25/23 0537  WBC 8.4 10.1 6.3  --  5.7  HGB 8.2* 8.0* 7.2* 9.7* 8.3*  HCT 24.8* 23.4* 21.4* 28.2* 23.5*  MCV 85.2 85.1 85.9  --  83.9  PLT 144* 149* 179  --  179   Cardiac Enzymes: No results for input(s): "CKTOTAL", "CKMB", "CKMBINDEX", "TROPONINI" in the last 168 hours. BNP: BNP (last 3 results) No results for input(s): "BNP" in the last 8760 hours.  ProBNP (last 3 results) No results for input(s): "PROBNP" in the last 8760 hours.  CBG: Recent Labs  Lab 07/25/23 0752 07/25/23 1203 07/25/23 1717 07/25/23 2137 07/26/23 0757   GLUCAP 113* 265* 166* 128* 137*       Signed:  Silvano Bilis MD.  [facility] 07/26/2023, 11:15 AM

## 2023-07-27 DIAGNOSIS — I10 Essential (primary) hypertension: Secondary | ICD-10-CM | POA: Diagnosis not present

## 2023-07-27 DIAGNOSIS — S7224XD Nondisplaced subtrochanteric fracture of right femur, subsequent encounter for closed fracture with routine healing: Secondary | ICD-10-CM | POA: Diagnosis not present

## 2023-07-27 DIAGNOSIS — J309 Allergic rhinitis, unspecified: Secondary | ICD-10-CM | POA: Diagnosis not present

## 2023-07-27 DIAGNOSIS — E569 Vitamin deficiency, unspecified: Secondary | ICD-10-CM | POA: Diagnosis not present

## 2023-07-28 DIAGNOSIS — M167 Other unilateral secondary osteoarthritis of hip: Secondary | ICD-10-CM | POA: Diagnosis not present

## 2023-07-29 DIAGNOSIS — S7221XA Displaced subtrochanteric fracture of right femur, initial encounter for closed fracture: Secondary | ICD-10-CM | POA: Diagnosis not present

## 2023-07-30 DIAGNOSIS — K219 Gastro-esophageal reflux disease without esophagitis: Secondary | ICD-10-CM | POA: Diagnosis not present

## 2023-07-30 DIAGNOSIS — G4700 Insomnia, unspecified: Secondary | ICD-10-CM | POA: Diagnosis not present

## 2023-08-02 DIAGNOSIS — S7221XA Displaced subtrochanteric fracture of right femur, initial encounter for closed fracture: Secondary | ICD-10-CM | POA: Diagnosis not present

## 2023-08-04 DIAGNOSIS — K523 Indeterminate colitis: Secondary | ICD-10-CM | POA: Diagnosis not present

## 2023-08-04 DIAGNOSIS — S7221XA Displaced subtrochanteric fracture of right femur, initial encounter for closed fracture: Secondary | ICD-10-CM | POA: Diagnosis not present

## 2023-08-04 DIAGNOSIS — D62 Acute posthemorrhagic anemia: Secondary | ICD-10-CM | POA: Diagnosis not present

## 2023-08-06 DIAGNOSIS — K523 Indeterminate colitis: Secondary | ICD-10-CM | POA: Diagnosis not present

## 2023-08-06 DIAGNOSIS — T699XXA Effect of reduced temperature, unspecified, initial encounter: Secondary | ICD-10-CM | POA: Diagnosis not present

## 2023-08-06 DIAGNOSIS — S7221XA Displaced subtrochanteric fracture of right femur, initial encounter for closed fracture: Secondary | ICD-10-CM | POA: Diagnosis not present

## 2023-08-09 DIAGNOSIS — S7221XA Displaced subtrochanteric fracture of right femur, initial encounter for closed fracture: Secondary | ICD-10-CM | POA: Diagnosis not present

## 2023-08-09 DIAGNOSIS — K523 Indeterminate colitis: Secondary | ICD-10-CM | POA: Diagnosis not present

## 2023-08-11 DIAGNOSIS — E119 Type 2 diabetes mellitus without complications: Secondary | ICD-10-CM | POA: Diagnosis not present

## 2023-08-11 DIAGNOSIS — R110 Nausea: Secondary | ICD-10-CM | POA: Diagnosis not present

## 2023-08-11 DIAGNOSIS — S7221XA Displaced subtrochanteric fracture of right femur, initial encounter for closed fracture: Secondary | ICD-10-CM | POA: Diagnosis not present

## 2023-08-11 DIAGNOSIS — R600 Localized edema: Secondary | ICD-10-CM | POA: Diagnosis not present

## 2023-08-12 DIAGNOSIS — D62 Acute posthemorrhagic anemia: Secondary | ICD-10-CM | POA: Diagnosis not present

## 2023-08-12 DIAGNOSIS — K219 Gastro-esophageal reflux disease without esophagitis: Secondary | ICD-10-CM | POA: Diagnosis not present

## 2023-08-12 DIAGNOSIS — K523 Indeterminate colitis: Secondary | ICD-10-CM | POA: Diagnosis not present

## 2023-08-12 DIAGNOSIS — S7221XA Displaced subtrochanteric fracture of right femur, initial encounter for closed fracture: Secondary | ICD-10-CM | POA: Diagnosis not present

## 2023-08-14 DIAGNOSIS — I110 Hypertensive heart disease with heart failure: Secondary | ICD-10-CM | POA: Diagnosis not present

## 2023-08-14 DIAGNOSIS — Z4789 Encounter for other orthopedic aftercare: Secondary | ICD-10-CM | POA: Diagnosis not present

## 2023-08-14 DIAGNOSIS — E119 Type 2 diabetes mellitus without complications: Secondary | ICD-10-CM | POA: Diagnosis not present

## 2023-08-14 DIAGNOSIS — M169 Osteoarthritis of hip, unspecified: Secondary | ICD-10-CM | POA: Diagnosis not present

## 2023-08-14 DIAGNOSIS — S7221XD Displaced subtrochanteric fracture of right femur, subsequent encounter for closed fracture with routine healing: Secondary | ICD-10-CM | POA: Diagnosis not present

## 2023-08-14 DIAGNOSIS — J309 Allergic rhinitis, unspecified: Secondary | ICD-10-CM | POA: Diagnosis not present

## 2023-08-14 DIAGNOSIS — D62 Acute posthemorrhagic anemia: Secondary | ICD-10-CM | POA: Diagnosis not present

## 2023-08-14 DIAGNOSIS — G4700 Insomnia, unspecified: Secondary | ICD-10-CM | POA: Diagnosis not present

## 2023-08-14 DIAGNOSIS — Z7984 Long term (current) use of oral hypoglycemic drugs: Secondary | ICD-10-CM | POA: Diagnosis not present

## 2023-08-14 DIAGNOSIS — M109 Gout, unspecified: Secondary | ICD-10-CM | POA: Diagnosis not present

## 2023-08-14 DIAGNOSIS — K5900 Constipation, unspecified: Secondary | ICD-10-CM | POA: Diagnosis not present

## 2023-08-14 DIAGNOSIS — Z85828 Personal history of other malignant neoplasm of skin: Secondary | ICD-10-CM | POA: Diagnosis not present

## 2023-08-14 DIAGNOSIS — K219 Gastro-esophageal reflux disease without esophagitis: Secondary | ICD-10-CM | POA: Diagnosis not present

## 2023-08-14 DIAGNOSIS — Z9181 History of falling: Secondary | ICD-10-CM | POA: Diagnosis not present

## 2023-08-14 DIAGNOSIS — L570 Actinic keratosis: Secondary | ICD-10-CM | POA: Diagnosis not present

## 2023-08-14 DIAGNOSIS — I5032 Chronic diastolic (congestive) heart failure: Secondary | ICD-10-CM | POA: Diagnosis not present

## 2023-08-17 DIAGNOSIS — S7221XA Displaced subtrochanteric fracture of right femur, initial encounter for closed fracture: Secondary | ICD-10-CM | POA: Diagnosis not present

## 2023-08-23 DIAGNOSIS — E119 Type 2 diabetes mellitus without complications: Secondary | ICD-10-CM | POA: Diagnosis not present

## 2023-08-23 DIAGNOSIS — I110 Hypertensive heart disease with heart failure: Secondary | ICD-10-CM | POA: Diagnosis not present

## 2023-08-23 DIAGNOSIS — M169 Osteoarthritis of hip, unspecified: Secondary | ICD-10-CM | POA: Diagnosis not present

## 2023-08-23 DIAGNOSIS — S7221XD Displaced subtrochanteric fracture of right femur, subsequent encounter for closed fracture with routine healing: Secondary | ICD-10-CM | POA: Diagnosis not present

## 2023-08-23 DIAGNOSIS — D62 Acute posthemorrhagic anemia: Secondary | ICD-10-CM | POA: Diagnosis not present

## 2023-08-23 DIAGNOSIS — G4700 Insomnia, unspecified: Secondary | ICD-10-CM | POA: Diagnosis not present

## 2023-08-23 DIAGNOSIS — I5032 Chronic diastolic (congestive) heart failure: Secondary | ICD-10-CM | POA: Diagnosis not present

## 2023-08-24 DIAGNOSIS — R634 Abnormal weight loss: Secondary | ICD-10-CM | POA: Diagnosis not present

## 2023-08-24 DIAGNOSIS — D500 Iron deficiency anemia secondary to blood loss (chronic): Secondary | ICD-10-CM | POA: Diagnosis not present

## 2023-08-24 DIAGNOSIS — R1319 Other dysphagia: Secondary | ICD-10-CM | POA: Diagnosis not present

## 2023-08-24 DIAGNOSIS — E871 Hypo-osmolality and hyponatremia: Secondary | ICD-10-CM | POA: Diagnosis not present

## 2023-08-24 DIAGNOSIS — K5900 Constipation, unspecified: Secondary | ICD-10-CM | POA: Diagnosis not present

## 2023-08-24 DIAGNOSIS — K2289 Other specified disease of esophagus: Secondary | ICD-10-CM | POA: Diagnosis not present

## 2023-09-03 DIAGNOSIS — I5032 Chronic diastolic (congestive) heart failure: Secondary | ICD-10-CM | POA: Diagnosis not present

## 2023-09-03 DIAGNOSIS — I110 Hypertensive heart disease with heart failure: Secondary | ICD-10-CM | POA: Diagnosis not present

## 2023-09-03 DIAGNOSIS — M169 Osteoarthritis of hip, unspecified: Secondary | ICD-10-CM | POA: Diagnosis not present

## 2023-09-03 DIAGNOSIS — D62 Acute posthemorrhagic anemia: Secondary | ICD-10-CM | POA: Diagnosis not present

## 2023-09-03 DIAGNOSIS — E119 Type 2 diabetes mellitus without complications: Secondary | ICD-10-CM | POA: Diagnosis not present

## 2023-09-03 DIAGNOSIS — S7221XD Displaced subtrochanteric fracture of right femur, subsequent encounter for closed fracture with routine healing: Secondary | ICD-10-CM | POA: Diagnosis not present

## 2023-09-03 DIAGNOSIS — G4700 Insomnia, unspecified: Secondary | ICD-10-CM | POA: Diagnosis not present

## 2023-10-20 DIAGNOSIS — E785 Hyperlipidemia, unspecified: Secondary | ICD-10-CM | POA: Diagnosis not present

## 2023-10-20 DIAGNOSIS — J3489 Other specified disorders of nose and nasal sinuses: Secondary | ICD-10-CM | POA: Diagnosis not present

## 2023-10-20 DIAGNOSIS — R531 Weakness: Secondary | ICD-10-CM | POA: Diagnosis not present

## 2023-10-20 DIAGNOSIS — N1832 Chronic kidney disease, stage 3b: Secondary | ICD-10-CM | POA: Diagnosis not present

## 2023-10-20 DIAGNOSIS — E1122 Type 2 diabetes mellitus with diabetic chronic kidney disease: Secondary | ICD-10-CM | POA: Diagnosis not present

## 2023-10-20 DIAGNOSIS — Z03818 Encounter for observation for suspected exposure to other biological agents ruled out: Secondary | ICD-10-CM | POA: Diagnosis not present

## 2023-10-20 DIAGNOSIS — E1169 Type 2 diabetes mellitus with other specified complication: Secondary | ICD-10-CM | POA: Diagnosis not present

## 2023-10-29 DIAGNOSIS — E785 Hyperlipidemia, unspecified: Secondary | ICD-10-CM | POA: Diagnosis not present

## 2023-10-29 DIAGNOSIS — E1122 Type 2 diabetes mellitus with diabetic chronic kidney disease: Secondary | ICD-10-CM | POA: Diagnosis not present

## 2023-10-29 DIAGNOSIS — E1169 Type 2 diabetes mellitus with other specified complication: Secondary | ICD-10-CM | POA: Diagnosis not present

## 2023-10-29 DIAGNOSIS — N1832 Chronic kidney disease, stage 3b: Secondary | ICD-10-CM | POA: Diagnosis not present

## 2023-10-29 DIAGNOSIS — I10 Essential (primary) hypertension: Secondary | ICD-10-CM | POA: Diagnosis not present

## 2023-11-17 DIAGNOSIS — E1169 Type 2 diabetes mellitus with other specified complication: Secondary | ICD-10-CM | POA: Diagnosis not present

## 2023-11-17 DIAGNOSIS — E785 Hyperlipidemia, unspecified: Secondary | ICD-10-CM | POA: Diagnosis not present

## 2023-11-17 DIAGNOSIS — I10 Essential (primary) hypertension: Secondary | ICD-10-CM | POA: Diagnosis not present

## 2023-11-17 DIAGNOSIS — N1832 Chronic kidney disease, stage 3b: Secondary | ICD-10-CM | POA: Diagnosis not present

## 2023-11-17 DIAGNOSIS — E1122 Type 2 diabetes mellitus with diabetic chronic kidney disease: Secondary | ICD-10-CM | POA: Diagnosis not present

## 2023-11-17 DIAGNOSIS — Z23 Encounter for immunization: Secondary | ICD-10-CM | POA: Diagnosis not present

## 2024-02-07 ENCOUNTER — Observation Stay
Admission: EM | Admit: 2024-02-07 | Discharge: 2024-02-11 | Disposition: A | Discharge status: Home / Self Care | Attending: Internal Medicine | Admitting: Internal Medicine

## 2024-02-07 ENCOUNTER — Other Ambulatory Visit: Payer: Self-pay

## 2024-02-07 ENCOUNTER — Emergency Department

## 2024-02-07 DIAGNOSIS — H6120 Impacted cerumen, unspecified ear: Secondary | ICD-10-CM | POA: Diagnosis not present

## 2024-02-07 DIAGNOSIS — I110 Hypertensive heart disease with heart failure: Secondary | ICD-10-CM | POA: Insufficient documentation

## 2024-02-07 DIAGNOSIS — I5030 Unspecified diastolic (congestive) heart failure: Secondary | ICD-10-CM

## 2024-02-07 DIAGNOSIS — I5032 Chronic diastolic (congestive) heart failure: Secondary | ICD-10-CM

## 2024-02-07 DIAGNOSIS — R42 Dizziness and giddiness: Principal | ICD-10-CM

## 2024-02-07 DIAGNOSIS — I10 Essential (primary) hypertension: Secondary | ICD-10-CM | POA: Diagnosis not present

## 2024-02-07 DIAGNOSIS — M25551 Pain in right hip: Secondary | ICD-10-CM

## 2024-02-07 DIAGNOSIS — S0990XA Unspecified injury of head, initial encounter: Secondary | ICD-10-CM | POA: Diagnosis not present

## 2024-02-07 DIAGNOSIS — Z79899 Other long term (current) drug therapy: Secondary | ICD-10-CM | POA: Insufficient documentation

## 2024-02-07 DIAGNOSIS — W19XXXA Unspecified fall, initial encounter: Secondary | ICD-10-CM

## 2024-02-07 DIAGNOSIS — R001 Bradycardia, unspecified: Secondary | ICD-10-CM | POA: Diagnosis not present

## 2024-02-07 DIAGNOSIS — N179 Acute kidney failure, unspecified: Secondary | ICD-10-CM | POA: Insufficient documentation

## 2024-02-07 DIAGNOSIS — W01198A Fall on same level from slipping, tripping and stumbling with subsequent striking against other object, initial encounter: Secondary | ICD-10-CM | POA: Insufficient documentation

## 2024-02-07 DIAGNOSIS — E119 Type 2 diabetes mellitus without complications: Secondary | ICD-10-CM | POA: Insufficient documentation

## 2024-02-07 DIAGNOSIS — I3139 Other pericardial effusion (noninflammatory): Secondary | ICD-10-CM | POA: Insufficient documentation

## 2024-02-07 DIAGNOSIS — S0003XA Contusion of scalp, initial encounter: Secondary | ICD-10-CM | POA: Insufficient documentation

## 2024-02-07 DIAGNOSIS — R0989 Other specified symptoms and signs involving the circulatory and respiratory systems: Secondary | ICD-10-CM | POA: Diagnosis not present

## 2024-02-07 DIAGNOSIS — R2681 Unsteadiness on feet: Principal | ICD-10-CM | POA: Insufficient documentation

## 2024-02-07 DIAGNOSIS — R55 Syncope and collapse: Secondary | ICD-10-CM | POA: Insufficient documentation

## 2024-02-07 DIAGNOSIS — R7989 Other specified abnormal findings of blood chemistry: Secondary | ICD-10-CM | POA: Insufficient documentation

## 2024-02-07 DIAGNOSIS — S12100A Unspecified displaced fracture of second cervical vertebra, initial encounter for closed fracture: Secondary | ICD-10-CM | POA: Diagnosis not present

## 2024-02-07 DIAGNOSIS — R0602 Shortness of breath: Secondary | ICD-10-CM | POA: Insufficient documentation

## 2024-02-07 DIAGNOSIS — J9811 Atelectasis: Secondary | ICD-10-CM | POA: Insufficient documentation

## 2024-02-07 DIAGNOSIS — Z23 Encounter for immunization: Secondary | ICD-10-CM | POA: Diagnosis not present

## 2024-02-07 DIAGNOSIS — M25511 Pain in right shoulder: Secondary | ICD-10-CM | POA: Diagnosis not present

## 2024-02-07 DIAGNOSIS — S51811A Laceration without foreign body of right forearm, initial encounter: Secondary | ICD-10-CM | POA: Diagnosis not present

## 2024-02-07 DIAGNOSIS — I5A Non-ischemic myocardial injury (non-traumatic): Secondary | ICD-10-CM | POA: Diagnosis not present

## 2024-02-07 DIAGNOSIS — R0609 Other forms of dyspnea: Secondary | ICD-10-CM

## 2024-02-07 DIAGNOSIS — M19011 Primary osteoarthritis, right shoulder: Secondary | ICD-10-CM | POA: Diagnosis not present

## 2024-02-07 DIAGNOSIS — M129 Arthropathy, unspecified: Secondary | ICD-10-CM | POA: Diagnosis not present

## 2024-02-07 DIAGNOSIS — S72141D Displaced intertrochanteric fracture of right femur, subsequent encounter for closed fracture with routine healing: Secondary | ICD-10-CM | POA: Diagnosis not present

## 2024-02-07 LAB — CBC WITH DIFFERENTIAL/PLATELET
Abs Immature Granulocytes: 0.04 10*3/uL (ref 0.00–0.07)
Basophils Absolute: 0 10*3/uL (ref 0.0–0.1)
Basophils Relative: 0 %
Eosinophils Absolute: 0.1 10*3/uL (ref 0.0–0.5)
Eosinophils Relative: 1 %
HCT: 36.7 % (ref 36.0–46.0)
Hemoglobin: 12.1 g/dL (ref 12.0–15.0)
Immature Granulocytes: 0 %
Lymphocytes Relative: 19 %
Lymphs Abs: 1.7 10*3/uL (ref 0.7–4.0)
MCH: 29.4 pg (ref 26.0–34.0)
MCHC: 33 g/dL (ref 30.0–36.0)
MCV: 89.1 fL (ref 80.0–100.0)
Monocytes Absolute: 0.5 10*3/uL (ref 0.1–1.0)
Monocytes Relative: 6 %
Neutro Abs: 6.6 10*3/uL (ref 1.7–7.7)
Neutrophils Relative %: 74 %
Platelets: 173 10*3/uL (ref 150–400)
RBC: 4.12 MIL/uL (ref 3.87–5.11)
RDW: 15.5 % (ref 11.5–15.5)
WBC: 9 10*3/uL (ref 4.0–10.5)
nRBC: 0 % (ref 0.0–0.2)

## 2024-02-07 LAB — COMPREHENSIVE METABOLIC PANEL WITH GFR
ALT: 17 U/L (ref 0–44)
AST: 27 U/L (ref 15–41)
Albumin: 3.9 g/dL (ref 3.5–5.0)
Alkaline Phosphatase: 62 U/L (ref 38–126)
Anion gap: 11 (ref 5–15)
BUN: 13 mg/dL (ref 8–23)
CO2: 27 mmol/L (ref 22–32)
Calcium: 9.6 mg/dL (ref 8.9–10.3)
Chloride: 101 mmol/L (ref 98–111)
Creatinine, Ser: 0.72 mg/dL (ref 0.44–1.00)
GFR, Estimated: >60 mL/min (ref >60)
Glucose, Bld: 156 mg/dL — ABNORMAL HIGH (ref 70–99)
Potassium: 3.9 mmol/L (ref 3.5–5.1)
Sodium: 139 mmol/L (ref 135–145)
Total Bilirubin: 1 mg/dL (ref 0.0–1.2)
Total Protein: 6.8 g/dL (ref 6.5–8.1)

## 2024-02-07 LAB — TROPONIN I (HIGH SENSITIVITY)
Troponin I (High Sensitivity): 52 ng/L — ABNORMAL HIGH (ref <18)
Troponin I (High Sensitivity): 46 ng/L — ABNORMAL HIGH (ref <18)

## 2024-02-07 LAB — D-DIMER, QUANTITATIVE: D-Dimer, Quant: 2.38 ug{FEU}/mL — ABNORMAL HIGH (ref 0.00–0.50)

## 2024-02-07 LAB — BRAIN NATRIURETIC PEPTIDE: B Natriuretic Peptide: 559.4 pg/mL — ABNORMAL HIGH (ref 0.0–100.0)

## 2024-02-07 MED ORDER — HYDRALAZINE HCL 20 MG/ML IJ SOLN
5.0000 mg | Freq: Four times a day (QID) | INTRAMUSCULAR | Status: DC | PRN
  Administered 2024-02-07 – 2024-02-09 (×4): 5 mg via INTRAVENOUS
  Filled 2024-02-07 (×4): qty 1

## 2024-02-07 MED ORDER — ACETAMINOPHEN 325 MG PO TABS
650.0000 mg | ORAL_TABLET | Freq: Four times a day (QID) | ORAL | Status: DC | PRN
  Administered 2024-02-07 – 2024-02-10 (×4): 650 mg via ORAL
  Filled 2024-02-07 (×4): qty 2

## 2024-02-07 MED ORDER — ONDANSETRON HCL 4 MG/2ML IJ SOLN: 4.0000 mg | Freq: Four times a day (QID) | INTRAMUSCULAR | Status: DC | PRN

## 2024-02-07 MED ORDER — FERROUS SULFATE 325 (65 FE) MG PO TABS
325.0000 mg | ORAL_TABLET | Freq: Every day | ORAL | Status: DC
  Administered 2024-02-08 – 2024-02-11 (×4): 325 mg via ORAL
  Filled 2024-02-07 (×4): qty 1

## 2024-02-07 MED ORDER — PANTOPRAZOLE SODIUM 40 MG PO TBEC
40.0000 mg | DELAYED_RELEASE_TABLET | Freq: Every day | ORAL | Status: DC
  Administered 2024-02-07 – 2024-02-11 (×5): 40 mg via ORAL
  Filled 2024-02-07 (×5): qty 1

## 2024-02-07 MED ORDER — ACETAMINOPHEN 650 MG RE SUPP: 650.0000 mg | Freq: Four times a day (QID) | RECTAL | Status: DC | PRN

## 2024-02-07 MED ORDER — LOSARTAN POTASSIUM 50 MG PO TABS
100.0000 mg | ORAL_TABLET | Freq: Every day | ORAL | Status: DC
  Administered 2024-02-08 – 2024-02-09 (×2): 100 mg via ORAL
  Filled 2024-02-07 (×2): qty 2

## 2024-02-07 MED ORDER — TETANUS-DIPHTH-ACELL PERTUSSIS 5-2.5-18.5 LF-MCG/0.5 IM SUSY
0.5000 mL | PREFILLED_SYRINGE | Freq: Once | INTRAMUSCULAR | Status: AC
  Administered 2024-02-07: 0.5 mL via INTRAMUSCULAR
  Filled 2024-02-07: qty 0.5

## 2024-02-07 MED ORDER — ENOXAPARIN SODIUM 40 MG/0.4ML IJ SOSY
40.0000 mg | PREFILLED_SYRINGE | INTRAMUSCULAR | Status: DC
  Administered 2024-02-07 – 2024-02-10 (×4): 40 mg via SUBCUTANEOUS
  Filled 2024-02-07 (×4): qty 0.4

## 2024-02-07 MED ORDER — ACETAMINOPHEN 500 MG PO TABS
1000.0000 mg | ORAL_TABLET | Freq: Once | ORAL | Status: AC
Start: 2024-02-07 — End: 2024-02-07
  Administered 2024-02-07: 1000 mg via ORAL
  Filled 2024-02-07: qty 2

## 2024-02-07 MED ORDER — CARVEDILOL 25 MG PO TABS
25.0000 mg | ORAL_TABLET | Freq: Two times a day (BID) | ORAL | Status: DC
  Administered 2024-02-08 (×2): 25 mg via ORAL
  Filled 2024-02-07 (×2): qty 1

## 2024-02-07 MED ORDER — LIDOCAINE 5 % EX PTCH
1.0000 | MEDICATED_PATCH | CUTANEOUS | Status: DC
  Administered 2024-02-08 – 2024-02-11 (×3): 1 via TRANSDERMAL
  Filled 2024-02-07 (×4): qty 1

## 2024-02-07 MED ORDER — POLYETHYLENE GLYCOL 3350 17 G PO PACK
17.0000 g | PACK | Freq: Every day | ORAL | Status: DC
  Filled 2024-02-07 (×5): qty 1

## 2024-02-07 MED ORDER — ONDANSETRON HCL 4 MG PO TABS: 4.0000 mg | ORAL_TABLET | Freq: Four times a day (QID) | ORAL | Status: DC | PRN

## 2024-02-07 MED ORDER — ATORVASTATIN CALCIUM 10 MG PO TABS
10.0000 mg | ORAL_TABLET | Freq: Every day | ORAL | Status: DC
  Administered 2024-02-07 – 2024-02-10 (×4): 10 mg via ORAL
  Filled 2024-02-07 (×4): qty 1

## 2024-02-07 MED ORDER — POLYETHYLENE GLYCOL 3350 17 G PO PACK: 17.0000 g | PACK | Freq: Every day | ORAL | Status: DC | PRN

## 2024-02-07 MED ORDER — LOSARTAN POTASSIUM 50 MG PO TABS
100.0000 mg | ORAL_TABLET | Freq: Once | ORAL | Status: AC
  Administered 2024-02-07: 100 mg via ORAL
  Filled 2024-02-07: qty 2

## 2024-02-07 MED ORDER — CARVEDILOL 25 MG PO TABS
25.0000 mg | ORAL_TABLET | Freq: Once | ORAL | Status: AC
Start: 2024-02-07 — End: 2024-02-07
  Administered 2024-02-07: 25 mg via ORAL
  Filled 2024-02-07: qty 1

## 2024-02-07 MED ORDER — GABAPENTIN 300 MG PO CAPS
300.0000 mg | ORAL_CAPSULE | Freq: Three times a day (TID) | ORAL | Status: DC
  Administered 2024-02-07 – 2024-02-10 (×8): 300 mg via ORAL
  Filled 2024-02-07 (×8): qty 1

## 2024-02-07 MED ORDER — LACTATED RINGERS IV BOLUS: 500.0000 mL | Freq: Once | INTRAVENOUS | Status: DC

## 2024-02-07 NOTE — ED Notes (Signed)
 Patient transported to CT

## 2024-02-07 NOTE — Assessment & Plan Note (Signed)
 Previously well-controlled hypertension on losartan  and carvedilol .  Diuretic has not been refilled since December 2024.  - Restart home losartan  and carvedilol 

## 2024-02-07 NOTE — ED Provider Notes (Signed)
 [facility] Provider Note    Event Date/Time   First MD Initiated Contact with Patient 02/07/24 1223     (approximate)   History   Fall   HPI  Tracy Spears is a 89 y.o. female with history of diabetes, hypertension, hyperlipidemia, presenting with a fall.  Patient states that when she got out of bed to put on her shoes, felt lightheaded, tried to sit back in bed, missed the bed and fell onto her face.  She denies any LOC.  Has some shortness of breath but no chest pain.  No cough, urinary symptoms, fever.  Does note right shoulder pain as well as right hip pain.  Was able to ambulate to the bathroom several times after the fall.  Denies being on any blood thinners.  She denies any focal weakness or numbness or headache.   Per independent history from EMS, she was hypertensive this morning but did not take her high blood pressure medications, she denies any chest pain at this time.  Was also noted to have a skin tear to her right forearm that they had dressed.  On independent chart review, she was seen by primary care doctor in March of this year, does have history of hypertension, is on amlodipine , losartan , carvedilol  and furosemide.  Has history of pericardial effusion, had a CT chest that was done that showed moderate pericardial effusion, had an echo that showed normal EF without evidence of pericardial effusion.  Physical Exam   Triage Vital Signs: ED Triage Vitals  Encounter Vitals Group     BP 02/07/24 1216 (!) 225/85     Systolic BP Percentile --      Diastolic BP Percentile --      Pulse Rate 02/07/24 1216 60     Resp 02/07/24 1216 20     Temp 02/07/24 1216 97.8 F (36.6 C)     Temp Source 02/07/24 1216 Oral     SpO2 02/07/24 1216 92 %     Weight --      Height --      Head Circumference --      Peak Flow --      Pain Score 02/07/24 1215 3     Pain Loc --      Pain Education --      Exclude from Growth Chart --     Most recent  vital signs: Vitals:   02/07/24 1216 02/07/24 1434  BP: (!) 225/85 (!) 191/85  Pulse: 60 (!) 57  Resp: 20 15  Temp: 97.8 F (36.6 C)   SpO2: 92% 92%     General: Awake, no distress.  CV:  Good peripheral perfusion.  Resp:  Normal effort.  Clear, no thoracic cage tenderness Abd:  No distention.  Soft nontender  Other:  Hematoma and abrasion to the right forehead as well as the left lateral periorbital region, abrasion to the left zygoma with mild tenderness.  No other intraoral lesions, she does have tenderness to the right shoulder as well as right hip, range of motion of all extremities are intact, no focal weakness or numbness, no cranial nerve deficits, no dysmetria, she is able to stand up and weight-bear with a rolling walker that she uses at baseline.  No ataxia.  She does have a skin tear to the right forearm without bony tenderness.  No midline spinal tenderness.   ED Results / Procedures / Treatments   Labs (all labs ordered are listed, but  only abnormal results are displayed) Labs Reviewed  COMPREHENSIVE METABOLIC PANEL WITH GFR - Abnormal; Notable for the following components:      Result Value   Glucose, Bld 156 (*)    All other components within normal limits  BRAIN NATRIURETIC PEPTIDE - Abnormal; Notable for the following components:   B Natriuretic Peptide 559.4 (*)    All other components within normal limits  TROPONIN I (HIGH SENSITIVITY) - Abnormal; Notable for the following components:   Troponin I (High Sensitivity) 52 (*)    All other components within normal limits  CBC WITH DIFFERENTIAL/PLATELET  URINALYSIS, W/ REFLEX TO CULTURE (INFECTION SUSPECTED)  TROPONIN I (HIGH SENSITIVITY)     EKG  EKG shows, showed sinus rhythm heart rate of 53, normal QRS, normal QTc, T wave flattening in 3, no ischemic ST elevation, not significant change compared to prior   RADIOLOGY On my independent interpretation, CT head without obvious intracranial  hemorrhage   PROCEDURES:  Critical Care performed: No  Ultrasound ED Echo  Date/Time: 02/07/2024 2:58 PM  Performed by: Shane Darling, MD Authorized by: Shane Darling, MD   Procedure details:    Indications: dyspnea     Views: parasternal short axis view and IVC view   Findings:    Pericardium: moderate pericardial effusion     LV Function: normal (>50% EF)     RV Diameter: normal     IVC: normal   Impression:    Impression: pericardial effusion present   Ultrasound ED Thoracic  Date/Time: 02/07/2024 3:34 PM  Performed by: Shane Darling, MD Authorized by: Shane Darling, MD   Procedure details:    Indications: dyspnea     Left lung pleural:  Visualized   Right lung pleural:  Visualized Findings:    A-lines noted throughout: identified     B-lines noted throughout: not identified   Impression:    Impression: none      MEDICATIONS ORDERED IN ED: Medications  lactated ringers bolus 500 mL (has no administration in time range)  carvedilol  (COREG ) tablet 25 mg (25 mg Oral Given 02/07/24 1242)  losartan  (COZAAR ) tablet 100 mg (100 mg Oral Given 02/07/24 1242)  Tdap (BOOSTRIX) injection 0.5 mL (0.5 mLs Intramuscular Given 02/07/24 1244)  acetaminophen  (TYLENOL ) tablet 1,000 mg (1,000 mg Oral Given 02/07/24 1242)     IMPRESSION / MDM / ASSESSMENT AND PLAN / ED COURSE  I reviewed the triage vital signs and the nursing notes.                              Differential diagnosis includes, but is not limited to, atypical ACS, CHF, viral illness, pneumonia, electrolyte derangements, intracranial hemorrhage, contusion, strain, sprain, fracture, dislocation.  Will get labs, EKG, troponin, chest x-ray, x-ray right shoulder, x-ray hip, CT head, cervical spine, max face.  Bedside ultrasound.  Patient's presentation is most consistent with acute presentation with potential threat to life or bodily function.  Independent interpretation of labs and imaging below.  Given her lightheadedness,  shortness of breath on exertion, pericardial effusion, consult the hospitalist for admission and she will evaluate the patient.  Also added some fluids as well as CT PE study given that she is satting 92% on room air and is not oxygen dependent.  The patient is on the cardiac monitor to evaluate for evidence of arrhythmia and/or significant heart rate changes.   Clinical Course as of 02/07/24 1546  Mon  Feb 07, 2024  1433 DG Shoulder Right IMPRESSION: 1. No acute fracture or dislocation. 2. Degenerative changes of the right shoulder   [TT]  1433 CT Maxillofacial WO CM IMPRESSION: 1. No acute intracranial pathology. Advanced small-vessel white matter disease in keeping with patient age. 2. No displaced fractures or dislocations of the facial bones. 3. Soft tissue contusion of the right forehead. 4. No fracture or static subluxation of the cervical spine. 5. 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 6. Severe cervical disc degenerative disease.   [TT]  1433 CT Cervical Spine Wo Contrast IMPRESSION: 1. No acute intracranial pathology. Advanced small-vessel white matter disease in keeping with patient age. 2. No displaced fractures or dislocations of the facial bones. 3. Soft tissue contusion of the right forehead. 4. No fracture or static subluxation of the cervical spine. 5. 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 6. Severe cervical disc degenerative disease.   [TT]  1433 CT Head Wo Contrast IMPRESSION: 1. No acute intracranial pathology. Advanced small-vessel white matter disease in keeping with patient age.   [TT]    Clinical Course User Index [TT] Shane Darling, MD     FINAL CLINICAL IMPRESSION(S) / ED DIAGNOSES   Final diagnoses:  Fall, initial encounter  Hematoma of scalp, initial encounter   Skin tear of right forearm without complication, initial encounter  Pain of right hip  Right shoulder pain, unspecified chronicity  Pericardial effusion  DOE (dyspnea on exertion)  Lightheadedness     Rx / DC Orders   ED Discharge Orders     None        Note:  This document was prepared using Dragon voice recognition software and may include unintentional dictation errors.    Shane Darling, MD 02/07/24 [phone]

## 2024-02-07 NOTE — ED Notes (Signed)
 Patient transported to X-ray

## 2024-02-07 NOTE — Assessment & Plan Note (Addendum)
 Patient is presenting with dizziness that began shortly after standing leading to a ground-level fall, with unclear history regarding how she came to fall face forward.  Concerning for presyncope versus syncope.  History is concerning for orthostasis, however she is markedly hypertensive on presentation. She has a history of pericardial effusion with possible recurrence on bedside echo by EDP, so will evaluate with echocardiogram.  She appears euvolemic on examination.  Telemetry with bradycardia.  - Telemetry monitoring - Echocardiogram ordered - Orthostatic vital signs - D-dimer - Restart home antihypertensives - PT/OT

## 2024-02-07 NOTE — ED Triage Notes (Addendum)
 Pt arrives via [facility] from home for fall. Pt denies LOC. No blood thinners. Pt stood up from lying down and felt dizzy just prior to falling. Pt has hematoma to forehead and c/o R shoulder pain. Skin tear to R forearm with dressing in place. Pt hypertensive but says she hasn't taken her medication today.

## 2024-02-07 NOTE — Assessment & Plan Note (Signed)
 Unclear etiology, with no history of chest pain.  Flat trend noted  - No further trending of troponin at this time - EKG ordered

## 2024-02-07 NOTE — H&P (Signed)
 History and Physical    Patient: Tracy Spears ZOX:[id number] DOB: 02-18-1933 DOA: 02/07/2024 DOS: the patient was seen and examined on 02/07/2024 PCP: Jimmy Moulding, MD  Patient coming from: Home  Chief Complaint:  Chief Complaint  Patient presents with   Fall   HPI: Tracy Spears is a 89 y.o. female with medical history significant of Diet controlled type 2 diabetes, hypertension, hyperlipidemia, previous pericardial effusion, GERD, who presents to the ED due to ground-level fall.  Tracy Spears states that she was in her normal state of health this morning when she stood up to grab her shoes but was unable to reach them.  At some point after standing, she began to feel dizzy and believes she tried to sit down but missed the bed.  She is notes that somehow she fell face forward hitting her face on the floor.  She adamantly denies losing consciousness.  Due to this, EMS was called.  She notes that since being in the ER, she has continued to feel dizzy after sitting up.  She notes that in the past couple days, she may have had 1 or 2 episodes of orthopnea.  She denies any chest pain, lower extremity swelling, abdominal distention.  She notes intermittent, chronic dyspnea on exertion that is mild.  She denies any poor appetite, nausea, vomiting, urinary symptoms.  ED course: On arrival to the ED, patient was hypertensive at 225/85 with heart rate of 60.  She was saturating at 92% on room air.  She was afebrile at 97.8.  Initial workup with unremarkable CBC and CMP.  BNP 559 with troponin of 52.  Troponin trend flat at 46.  Chest x-ray, right shoulder x-ray, hip x-ray, CT C-spine, CT head, CT maxillofacial without acute abnormalities.  Due to persistent symptoms, [facility] contacted for admission.   Review of Systems: As mentioned in the history of present illness. All other systems reviewed and are negative.  Past Medical History:  Diagnosis Date   Actinic keratosis    Basal cell  carcinoma 04/07/2010   Right paranasal.    Diabetes mellitus without complication [facility])    Past Surgical History:  Procedure Laterality Date   INTRAMEDULLARY (IM) NAIL INTERTROCHANTERIC Right 07/19/2023   Procedure: INTRAMEDULLARY (IM) NAIL INTERTROCHANTERIC;  Surgeon: Lauree Pomfret, MD;  Location: [facility] ORS;  Service: Orthopedics;  Laterality: Right;  Right Synthes TFN-A nail   Social History:  reports that she has never smoked. She has never used smokeless tobacco. She reports that she does not drink alcohol  and does not use drugs.  Allergies  Allergen Reactions   Lisinopril Other (See Comments)    cough    Family History  Problem Relation Age of Onset   Breast cancer Other     Prior to Admission medications   Medication Sig Start Date End Date Taking? Authorizing Provider  atorvastatin  (LIPITOR) 10 MG tablet Take 1 tablet by mouth daily. 12/22/19  Yes [provider]  azelastine (ASTELIN) 0.1 % nasal spray Place 2 sprays into the nose 2 (two) times daily as needed for rhinitis. 05/03/23  Yes [provider]  carvedilol  (COREG ) 25 MG tablet Take 25 mg by mouth 2 (two) times daily.   Yes [provider]  Cholecalciferol  50 MCG (2000 UT) CAPS Take 1 capsule by mouth daily.   Yes [provider]  colchicine 0.6 MG tablet Take 0.6 mg by mouth daily as needed. 02/19/20  Yes [provider]  ferrous sulfate 325 (65 FE) MG  tablet Take 325 mg by mouth daily with breakfast.   Yes [provider]  gabapentin (NEURONTIN) 300 MG capsule Take 1 capsule by mouth 3 (three) times daily. 02/06/20  Yes [provider]  losartan  (COZAAR ) 100 MG tablet Take 100 mg by mouth daily. 01/03/24  Yes [provider]  metFORMIN  (GLUCOPHAGE -XR) 500 MG 24 hr tablet Take 2 tablets (1,000 mg total) by mouth at bedtime. Patient taking differently: Take 1,500 mg by mouth at bedtime. 07/26/23  Yes Wouk, Haynes Lips, MD  Multiple Vitamin  (MULTI-VITAMIN) tablet Take 1 tablet by mouth daily.   Yes [provider]  pantoprazole  (PROTONIX ) 40 MG tablet Take 1 tablet by mouth daily. 04/02/20  Yes [provider]  polyethylene glycol (MIRALAX  / GLYCOLAX ) 17 g packet Take 17 g by mouth daily. 07/27/23  Yes Wouk, Haynes Lips, MD  potassium chloride  (KLOR-CON ) 10 MEQ tablet Take 10 mEq by mouth daily. 01/04/24  Yes [provider]  torsemide (DEMADEX) 20 MG tablet Take 20 mg by mouth daily. Patient not taking: Reported on 02/07/2024 08/30/23   [provider]    Physical Exam: Vitals:   02/07/24 1216 02/07/24 1434 02/07/24 1622  BP: (!) 225/85 (!) 191/85 (!) 192/67  Pulse: 60 (!) 57 (!) 54  Resp: 20 15 15   Temp: 97.8 F (36.6 C)    TempSrc: Oral    SpO2: 92% 92% 94%   Physical Exam Vitals and nursing note reviewed.  Constitutional:      General: She is not in acute distress.    Appearance: She is normal weight. She is not toxic-appearing.  HENT:     Head: Normocephalic.     Comments: Multiple facial bruises above bilateral eyebrows    Mouth/Throat:     Mouth: Mucous membranes are moist.     Pharynx: Oropharynx is clear.  Eyes:     Conjunctiva/sclera: Conjunctivae normal.     Pupils: Pupils are equal, round, and reactive to light.  Cardiovascular:     Rate and Rhythm: Regular rhythm. Bradycardia present.     Heart sounds: No murmur heard. Pulmonary:     Effort: Pulmonary effort is normal. No respiratory distress.     Breath sounds: Wheezing (Intermittent expiratory wheeze) present. No rhonchi or rales.  Abdominal:     General: Bowel sounds are normal. There is no distension.     Palpations: Abdomen is soft.     Tenderness: There is no abdominal tenderness. There is no guarding.  Musculoskeletal:     Right lower leg: No edema.     Left lower leg: No edema.  Skin:    General: Skin is warm and dry.  Neurological:     Mental Status: She is alert and oriented to person, place, and  time. Mental status is at baseline.     Motor: No weakness.  Psychiatric:        Mood and Affect: Mood normal.        Behavior: Behavior normal.    Data Reviewed: CBC with WBC of 9.0, hemoglobin of 12.1, platelets 173 CMP with sodium of 139, potassium 3.9, bicarb 27, glucose 156, BUN 13, creatinine 0.72, AST 27, ALT 17, and GFR above 60 BNP 559 Troponin 52 and then 46  EKG ordered.  \ DG Chest 2 View Result Date: 02/07/2024 CLINICAL DATA:  Shortness of breath and right hip pain after falling this morning. EXAM: CHEST - 2 VIEW COMPARISON:  Radiographs 07/19/2023 and 01/20/2023.  CT 01/14/2023. FINDINGS: The heart  size and mediastinal contours are stable with cardiac enlargement and aortic atherosclerosis. There is chronic vascular congestion mild bibasilar atelectasis. No edema, confluent airspace disease, pleural effusion or pneumothorax. No acute osseous findings are identified in the chest. Grossly stable thoracic compression deformities and asymmetric left glenohumeral arthropathy. IMPRESSION: No evidence of acute chest injury. Chronic vascular congestion and bibasilar atelectasis. Electronically Signed   By: Elmon Hagedorn M.D.   On: 02/07/2024 14:55   DG Hip Unilat With Pelvis 2-3 Views Right Result Date: 02/07/2024 CLINICAL DATA:  Right hip pain after falling this morning. Previous hip fracture with fixation. EXAM: DG HIP (WITH OR WITHOUT PELVIS) 2-3V RIGHT COMPARISON:  Radiographs 07/19/2023. FINDINGS: Postsurgical changes from previous right femoral intramedullary nail and dynamic screw fixation for a comminuted intertrochanteric fracture. The visualized hardware appears intact without loosening. The distal end of the intramedullary nail is not included on this examination of the hip. Possible incomplete healing of the previously demonstrated intertrochanteric fracture. There is some heterotopic ossification adjacent to the right lesser trochanter. There are old left parasymphyseal pubic  rami fractures and multilevel spondylosis. No definite acute fractures are identified. IMPRESSION: No definite acute fracture identified. Postsurgical changes from previous right femoral intramedullary nail and dynamic screw fixation for a comminuted intertrochanteric fracture. Possible incomplete healing of the previously demonstrated intertrochanteric fracture. Electronically Signed   By: Elmon Hagedorn M.D.   On: 02/07/2024 14:53   CT Head Wo Contrast Result Date: 02/07/2024 CLINICAL DATA:  Fall, head injury, forehead hematoma EXAM: CT HEAD WITHOUT CONTRAST CT MAXILLOFACIAL WITHOUT CONTRAST CT CERVICAL SPINE WITHOUT CONTRAST TECHNIQUE: Multidetector CT imaging of the head, cervical spine, and maxillofacial structures were performed using the standard protocol without intravenous contrast. Multiplanar CT image reconstructions of the cervical spine and maxillofacial structures were also generated. RADIATION DOSE REDUCTION: This exam was performed according to the departmental dose-optimization program which includes automated exposure control, adjustment of the mA and/or kV according to patient size and/or use of iterative reconstruction technique. COMPARISON:  None Available. FINDINGS: CT HEAD FINDINGS Brain: No evidence of acute infarction, hemorrhage, hydrocephalus, extra-axial collection or mass lesion/mass effect. Extensive periventricular and deep white matter hypodensity. Vascular: No hyperdense vessel or unexpected calcification. CT FACIAL BONES FINDINGS Skull: Normal. Negative for fracture or focal lesion. Facial bones: No displaced fractures or dislocations. Sinuses/Orbits: No acute finding. Other: Soft tissue contusion of the right forehead. CT CERVICAL SPINE FINDINGS Alignment: Normal. Skull base and vertebrae: No acute fracture. Chronic nondisplaced fracture deformity of the dens with extensive associated degenerative pannus about the atlantoaxial joint (series 7, image 31). No primary bone lesion  or focal pathologic process. Soft tissues and spinal canal: No prevertebral fluid or swelling. No visible canal hematoma. Disc levels: Severe disc space height loss and osteophytosis, worst at C5-C7. Upper chest: Negative. Other: None. IMPRESSION: 1. No acute intracranial pathology. Advanced small-vessel white matter disease in keeping with patient age. 2. No displaced fractures or dislocations of the facial bones. 3. Soft tissue contusion of the right forehead. 4. No fracture or static subluxation of the cervical spine. 5. 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 6. Severe cervical disc degenerative disease. Electronically Signed   By: Fredricka Jenny M.D.   On: 02/07/2024 13:43   CT Cervical Spine Wo Contrast Result Date: 02/07/2024 CLINICAL DATA:  Fall, head injury, forehead hematoma EXAM: CT HEAD WITHOUT CONTRAST CT MAXILLOFACIAL WITHOUT CONTRAST CT CERVICAL SPINE WITHOUT CONTRAST TECHNIQUE: Multidetector CT imaging of the  head, cervical spine, and maxillofacial structures were performed using the standard protocol without intravenous contrast. Multiplanar CT image reconstructions of the cervical spine and maxillofacial structures were also generated. RADIATION DOSE REDUCTION: This exam was performed according to the departmental dose-optimization program which includes automated exposure control, adjustment of the mA and/or kV according to patient size and/or use of iterative reconstruction technique. COMPARISON:  None Available. FINDINGS: CT HEAD FINDINGS Brain: No evidence of acute infarction, hemorrhage, hydrocephalus, extra-axial collection or mass lesion/mass effect. Extensive periventricular and deep white matter hypodensity. Vascular: No hyperdense vessel or unexpected calcification. CT FACIAL BONES FINDINGS Skull: Normal. Negative for fracture or focal lesion. Facial bones: No displaced fractures or dislocations.  Sinuses/Orbits: No acute finding. Other: Soft tissue contusion of the right forehead. CT CERVICAL SPINE FINDINGS Alignment: Normal. Skull base and vertebrae: No acute fracture. Chronic nondisplaced fracture deformity of the dens with extensive associated degenerative pannus about the atlantoaxial joint (series 7, image 31). No primary bone lesion or focal pathologic process. Soft tissues and spinal canal: No prevertebral fluid or swelling. No visible canal hematoma. Disc levels: Severe disc space height loss and osteophytosis, worst at C5-C7. Upper chest: Negative. Other: None. IMPRESSION: 1. No acute intracranial pathology. Advanced small-vessel white matter disease in keeping with patient age. 2. No displaced fractures or dislocations of the facial bones. 3. Soft tissue contusion of the right forehead. 4. No fracture or static subluxation of the cervical spine. 5. 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 6. Severe cervical disc degenerative disease. Electronically Signed   By: Fredricka Jenny M.D.   On: 02/07/2024 13:43   CT Maxillofacial WO CM Result Date: 02/07/2024 CLINICAL DATA:  Fall, head injury, forehead hematoma EXAM: CT HEAD WITHOUT CONTRAST CT MAXILLOFACIAL WITHOUT CONTRAST CT CERVICAL SPINE WITHOUT CONTRAST TECHNIQUE: Multidetector CT imaging of the head, cervical spine, and maxillofacial structures were performed using the standard protocol without intravenous contrast. Multiplanar CT image reconstructions of the cervical spine and maxillofacial structures were also generated. RADIATION DOSE REDUCTION: This exam was performed according to the departmental dose-optimization program which includes automated exposure control, adjustment of the mA and/or kV according to patient size and/or use of iterative reconstruction technique. COMPARISON:  None Available. FINDINGS: CT HEAD FINDINGS Brain: No evidence of acute  infarction, hemorrhage, hydrocephalus, extra-axial collection or mass lesion/mass effect. Extensive periventricular and deep white matter hypodensity. Vascular: No hyperdense vessel or unexpected calcification. CT FACIAL BONES FINDINGS Skull: Normal. Negative for fracture or focal lesion. Facial bones: No displaced fractures or dislocations. Sinuses/Orbits: No acute finding. Other: Soft tissue contusion of the right forehead. CT CERVICAL SPINE FINDINGS Alignment: Normal. Skull base and vertebrae: No acute fracture. Chronic nondisplaced fracture deformity of the dens with extensive associated degenerative pannus about the atlantoaxial joint (series 7, image 31). No primary bone lesion or focal pathologic process. Soft tissues and spinal canal: No prevertebral fluid or swelling. No visible canal hematoma. Disc levels: Severe disc space height loss and osteophytosis, worst at C5-C7. Upper chest: Negative. Other: None. IMPRESSION: 1. No acute intracranial pathology. Advanced small-vessel white matter disease in keeping with patient age. 2. No displaced fractures or dislocations of the facial bones. 3. Soft tissue contusion of the right forehead. 4. No fracture or static subluxation of the cervical spine. 5. 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 6. Severe cervical disc degenerative disease. Electronically Signed   By: Evonne Hoist.D.  On: 02/07/2024 13:43   DG Shoulder Right Result Date: 02/07/2024 CLINICAL DATA:  Status post fall with right shoulder pain EXAM: RIGHT SHOULDER - 3 VIEW COMPARISON:  None Available. FINDINGS: There is no evidence of fracture or dislocation. Degenerative changes of the right shoulder. Soft tissues are unremarkable. Partially imaged old posterior right rib fracture. IMPRESSION: 1. No acute fracture or dislocation. 2. Degenerative changes of the right shoulder. Electronically Signed   By: Limin  Xu  M.D.   On: 02/07/2024 13:38   Results are pending, will review when available.  Assessment and Plan:  * Pre-syncope Patient is presenting with dizziness that began shortly after standing leading to a ground-level fall, with unclear history regarding how she came to fall face forward.  Concerning for presyncope versus syncope.  History is concerning for orthostasis, however she is markedly hypertensive on presentation. She has a history of pericardial effusion with possible recurrence on bedside echo by EDP, so will evaluate with echocardiogram.  She appears euvolemic on examination.  Telemetry with bradycardia.  - Telemetry monitoring - Echocardiogram ordered - Orthostatic vital signs - D-dimer - Restart home antihypertensives - PT/OT  Elevated troponin Unclear etiology, with no history of chest pain.  Flat trend noted  - No further trending of troponin at this time - EKG ordered  (HFpEF) heart failure with preserved ejection fraction (HCC) History of HFpEF with last EF of 60-65% with grade 2 diastolic dysfunction and May 2024.  BNP is elevated at this time, however patient appears euvolemic.  Previously on scheduled torsemide, however does not appear to have been refilled this year  - Daily weights - Echocardiogram ordered as noted above  Essential hypertension Previously well-controlled hypertension on losartan  and carvedilol .  Diuretic has not been refilled since December 2024.  - Restart home losartan  and carvedilol   Sinus bradycardia Per chart review, patient has a history of chronic sinus bradycardia.  - Telemetry monitoring given presyncope  Diabetes mellitus type 2, noninsulin dependent (HCC) Diet-controlled type 2 diabetes.  - Hold off on SSI - A1c  Advance Care Planning:   Code Status: Full Code   Consults: None  Family Communication: No family at bedside  Severity of Illness: The appropriate patient status for this patient is OBSERVATION. Observation  status is judged to be reasonable and necessary in order to provide the required intensity of service to ensure the patient's safety. The patient's presenting symptoms, physical exam findings, and initial radiographic and laboratory data in the context of their medical condition is felt to place them at decreased risk for further clinical deterioration. Furthermore, it is anticipated that the patient will be medically stable for discharge from the hospital within 2 midnights of admission.   Author: Avi Body, MD 02/07/2024 4:55 PM  For on call review [email or web address].

## 2024-02-07 NOTE — Assessment & Plan Note (Signed)
 Per chart review, patient has a history of chronic sinus bradycardia.  - Telemetry monitoring given presyncope

## 2024-02-07 NOTE — Assessment & Plan Note (Signed)
 Patient can go back on her Glucophage  as outpatient

## 2024-02-07 NOTE — Assessment & Plan Note (Addendum)
 History of HFpEF with last EF of 60-65% with grade 2 diastolic dysfunction and May 2024.  BNP is elevated at this time, however patient appears euvolemic.  Previously on scheduled torsemide, however does not appear to have been refilled this year  - Daily weights - Echocardiogram ordered as noted above

## 2024-02-08 ENCOUNTER — Observation Stay

## 2024-02-08 DIAGNOSIS — R600 Localized edema: Secondary | ICD-10-CM | POA: Diagnosis not present

## 2024-02-08 DIAGNOSIS — R55 Syncope and collapse: Secondary | ICD-10-CM | POA: Diagnosis not present

## 2024-02-08 DIAGNOSIS — J9811 Atelectasis: Secondary | ICD-10-CM | POA: Diagnosis not present

## 2024-02-08 DIAGNOSIS — I3481 Nonrheumatic mitral (valve) annulus calcification: Secondary | ICD-10-CM | POA: Diagnosis not present

## 2024-02-08 DIAGNOSIS — I2510 Atherosclerotic heart disease of native coronary artery without angina pectoris: Secondary | ICD-10-CM | POA: Diagnosis not present

## 2024-02-08 DIAGNOSIS — R7989 Other specified abnormal findings of blood chemistry: Secondary | ICD-10-CM | POA: Diagnosis not present

## 2024-02-08 DIAGNOSIS — R918 Other nonspecific abnormal finding of lung field: Secondary | ICD-10-CM | POA: Diagnosis not present

## 2024-02-08 DIAGNOSIS — M7122 Synovial cyst of popliteal space [Baker], left knee: Secondary | ICD-10-CM | POA: Diagnosis not present

## 2024-02-08 LAB — BASIC METABOLIC PANEL WITH GFR
Anion gap: 11 (ref 5–15)
BUN: 17 mg/dL (ref 8–23)
CO2: 25 mmol/L (ref 22–32)
Calcium: 9.1 mg/dL (ref 8.9–10.3)
Chloride: 101 mmol/L (ref 98–111)
Creatinine, Ser: 0.72 mg/dL (ref 0.44–1.00)
GFR, Estimated: >60 mL/min (ref >60)
Glucose, Bld: 122 mg/dL — ABNORMAL HIGH (ref 70–99)
Potassium: 3.1 mmol/L — ABNORMAL LOW (ref 3.5–5.1)
Sodium: 137 mmol/L (ref 135–145)

## 2024-02-08 LAB — MAGNESIUM: Magnesium: 1.1 mg/dL — ABNORMAL LOW (ref 1.7–2.4)

## 2024-02-08 MED ORDER — TRAZODONE HCL 50 MG PO TABS
25.0000 mg | ORAL_TABLET | Freq: Every evening | ORAL | Status: DC | PRN
  Administered 2024-02-08: 25 mg via ORAL
  Filled 2024-02-08: qty 1

## 2024-02-08 MED ORDER — IOHEXOL 350 MG/ML SOLN
75.0000 mL | Freq: Once | INTRAVENOUS | Status: AC | PRN
  Administered 2024-02-08: 75 mL via INTRAVENOUS

## 2024-02-08 MED ORDER — METHOCARBAMOL 500 MG PO TABS
500.0000 mg | ORAL_TABLET | Freq: Three times a day (TID) | ORAL | Status: DC | PRN
  Administered 2024-02-10: 500 mg via ORAL
  Filled 2024-02-08: qty 1

## 2024-02-08 MED ORDER — POTASSIUM CHLORIDE CRYS ER 20 MEQ PO TBCR
40.0000 meq | EXTENDED_RELEASE_TABLET | ORAL | Status: AC
  Administered 2024-02-08 (×2): 40 meq via ORAL
  Filled 2024-02-08 (×2): qty 2

## 2024-02-08 MED ORDER — MORPHINE SULFATE (PF) 2 MG/ML IV SOLN
2.0000 mg | INTRAVENOUS | Status: DC | PRN
  Administered 2024-02-08 – 2024-02-09 (×2): 2 mg via INTRAVENOUS
  Filled 2024-02-08 (×2): qty 1

## 2024-02-08 NOTE — Progress Notes (Signed)
 PT Cancellation Note  Patient Details Name: SYDNEI OHAVER MRN: [id number] DOB: [birth year]   Cancelled Treatment:    Reason Eval/Treat Not Completed: Other (comment) (Patient awaiting CT to rule out PE. PT will continue with attempts as appropriate.)  Ozie Bo, PT, MPT  Erlene Hawks 02/08/2024, 12:50 PM

## 2024-02-08 NOTE — Care Management Obs Status (Signed)
 MEDICARE OBSERVATION STATUS NOTIFICATION   Patient Details  Name: Tracy Spears MRN: [id number] Date of Birth: 11-Feb-1933   Medicare Observation Status Notification Given:  Yes    Khadir Roam W, CMA 02/08/2024, 11:49 AM

## 2024-02-08 NOTE — TOC Initial Note (Signed)
 Transition of Care [facility]) - Initial/Assessment Note    Patient Details  Name: Tracy Spears MRN: [id number] Date of Birth: 1933/07/05  [facility] CM/SW Contact:    Alexandra Ice, RN Phone Number: 02/08/2024, 3:12 PM  Clinical Narrative:                 Met with spouse, Porfirio Bristol, at bedside. Patient off the unit for testing. Their son lives with them, have DME in the home. Has PCP. She has transport at discharge. TOC will continue to monitor  Expected Discharge Plan: Home/Self Care Barriers to Discharge: Continued Medical Work up   Patient Goals and CMS Choice            Expected Discharge Plan and Services       Living arrangements for the past 2 months: Single Family Home                   DME Agency: NA                  Prior Living Arrangements/Services Living arrangements for the past 2 months: Single Family Home Lives with:: Adult Children, Spouse Patient language and need for interpreter reviewed:: Yes Do you feel safe going back to the place where you live?: Yes      Need for Family Participation in Patient Care: No (Comment) Care giver support system in place?: Yes (comment) Current home services: DME (Rolling walker with 3-wheels) Criminal Activity/Legal Involvement Pertinent to Current Situation/Hospitalization: No - Comment as needed  Activities of Daily Living      Permission Sought/Granted                  Emotional Assessment         Alcohol  / Substance Use: Not Applicable Psych Involvement: No (comment)  Admission diagnosis:  Lightheadedness [R42] Pericardial effusion [I31.39] DOE (dyspnea on exertion) [R06.09] Pre-syncope [R55] Hematoma of scalp, initial encounter [S00.03XA] Fall, initial encounter [W19.XXXA] Skin tear of right forearm without complication, initial encounter [S51.811A] Pain of right hip [M25.551] Right shoulder pain, unspecified chronicity [M25.511] Patient Active Problem List    Diagnosis Date Noted   Pre-syncope 02/07/2024   Elevated troponin 02/07/2024   (HFpEF) heart failure with preserved ejection fraction (HCC) 02/07/2024   Sinus bradycardia 02/07/2024   Uncontrolled type 2 diabetes mellitus with hyperglycemia, without long-term current use of insulin  (HCC) 07/19/2023   Closed  fracture of proximal femur, right, initial encounter (HCC) 07/19/2023   AKI (acute kidney injury) (HCC) 07/19/2023   Hip fracture requiring operative repair, right, closed, initial encounter (HCC) 07/19/2023   Hyponatremia 01/20/2023   Essential hypertension 01/20/2023   Diabetes mellitus type 2, noninsulin dependent (HCC) 01/20/2023   Hyperlipidemia 01/20/2023   GERD (gastroesophageal reflux disease) 01/20/2023   Pericardial effusion 01/20/2023   Weakness 01/20/2023   PCP:  Jimmy Moulding, MD Pharmacy:   [facility] - [place], [place] - [address] [place] [address] Phone: [phone] Fax: [phone]  [facility] - [place], [place] - [address] [place] [address] Phone: [phone] Fax: [phone]     Social Drivers of Health (SDOH) Social History: SDOH Screenings   Food Insecurity: No Food Insecurity (02/08/2024)  Housing: Low Risk  (02/08/2024)  Transportation Needs: No Transportation Needs (02/08/2024)  Utilities: Not At Risk (02/08/2024)  Social Connections: Unknown (02/08/2024)  Tobacco Use: Low Risk  (11/17/2023)   Received  from [facility]   SDOH Interventions:     Readmission Risk Interventions     No data to display

## 2024-02-08 NOTE — Progress Notes (Signed)
 OT Cancellation Note  Patient Details Name: Tracy Spears MRN: [id number] DOB: 05-Oct-1932   Cancelled Treatment:    Reason Eval/Treat Not Completed: Other (comment) (per chart pt is awaiting further work up to rule out PE; OT will re attempt after work up is completed as approrpiate.)  Gerre Kraft, OTD OTR/L  02/08/24, 1:40 PM

## 2024-02-08 NOTE — Progress Notes (Addendum)
 Progress Note   Patient: Tracy Spears DOB: June 09, 1933 DOA: 02/07/2024     0 DOS: the patient was seen and examined on 02/08/2024   Brief hospital course: "Tracy Spears is a 89 y.o. female with medical history significant of Diet controlled type 2 diabetes, hypertension, hyperlipidemia, previous pericardial effusion, GERD, who presents to the ED due to ground-level fall.   Tracy Spears states that she was in her normal state of health this morning when she stood up to grab her shoes but was unable to reach them.  At some point after standing, she began to feel dizzy and believes she tried to sit down but missed the bed.  She is notes that somehow she fell face forward hitting her face on the floor.  She adamantly denies losing consciousness.  Due to this, EMS was called...." See H&P for full HPI on admission & ED course.    Patient was admitted for further evaluation and management of suspected pre-syncopal episode leading to her fall.  Further hospital course and management as outlined below.    Assessment and Plan:  *Pre-syncope Patient p/w dizziness that began shortly after standing leading to a ground-level fall, with unclear history regarding how she came to fall face forward.  Concerning for presyncope versus syncope.  History is concerning for orthostasis, however she was markedly hypertensive on presentation. She has a history of pericardial effusion with possible recurrence on bedside echo by EDP, so will evaluate with echocardiogram.  She appears euvolemic on examination.  Telemetry with bradycardia.  Otherwise pt reports some recently orthopnea but no dyspnea or chest pain. - Telemetry monitoring - Echocardiogram -- pending - Orthostatic vital signs --PT/OT evaluations - D-dimer elevated -- need to rule out PE/DVT -- CTA chest and BLE doppler U/S pending - Restarted on home antihypertensives - Fall precautions  Hypokalemia - K 3.1, was 3.9 on  admission --Replacing with 40 mEq x 2 doses --Monitor BMP, check Mg level  Neck pain - due to muscle tightness from fall at home CT's of head, face, neck were without acute injuries.   Anticipate gradual improvement over a few weeks. --Add Robaxin  PRN --Heat and/or cold compress PRN  Elevated troponin -- 52 >> 46.  EKG non-acute.  No chest pain. Due to demand ischemia in setting presyncope most likely. --Repeat EKG and troponin if chest pain  (HFpEF) heart failure with preserved ejection fraction (HCC) History of HFpEF with last EF of 60-65% with grade 2 diastolic dysfunction and May 2024.  BNP is elevated at this time, however patient appears euvolemic.  Previously on scheduled torsemide, however does not appear to have been refilled this year - Daily weights to monitor volume status - Echo -- pending as above  Essential hypertension - BP's elevated but diastolic is often on low side. Previously well-controlled hypertension on losartan  and carvedilol .  Diuretic has not been refilled since December 2024. - Resumed on home home losartan  and carvedilol   Sinus bradycardia - chronic - Telemetry monitoring given presyncope  Diabetes mellitus type 2, noninsulin dependent (HCC) Diet-controlled type 2 diabetes. --Sliding scale Novolog  --Titrate regimen      Subjective: Pt seen with husband at bedside this AM. She denies acute complaints besides forehead and neck pain from her fall. She denies dizziness, chest pain, palpitations, shortness of breath.   Physical Exam: Vitals:   02/08/24 0356 02/08/24 0500 02/08/24 0812 02/08/24 1250  BP: (!) 111/52  (!) 143/49 (!) 142/63  Pulse: (!) 54  (!) 55 Tracy Spears)  56  Resp: 17  18 18   Temp: 98.2 F (36.8 C)  97.8 F (36.6 C) 98.5 F (36.9 C)  TempSrc: Oral  Oral Oral  SpO2: 93%  94% 92%  Weight:  51.1 kg     General exam: awake, alert, no acute distress HEENT: right forehead ecchymosis, moist mucus membranes, hearing grossly normal   Respiratory system: CTAB diminished bases, no wheezes rhonchi, normal respiratory effort on room air Cardiovascular system: normal S1/S2, RRR, no JVD, murmurs, rubs, gallops, no pedal edema.   Gastrointestinal system: soft, NT, ND, no HSM felt, +bowel sounds. Central nervous system: A&O x3. no gross focal neurologic deficits, normal speech Extremities: moves all , no edema, normal tone Skin: dry, intact, normal temperature Psychiatry: normal mood, congruent affect, judgement and insight appear normal   Data Reviewed:  Notable labs --   K 3.1 Glucose 122  PENDING -- Echo, BLE DVT U/S, CTA Chest   Family Communication: husband at bedside  Disposition: Status is: Observation Remains admitted with pending evaluation as above.   Planned Discharge Destination: Home with Home Health    Time spent: 45 minutes  Author: Montey Apa, DO 02/08/2024 3:10 PM  For on call review [email or web address].

## 2024-02-08 NOTE — Plan of Care (Signed)
  Problem: Education: Goal: Knowledge of condition and prescribed therapy will improve 02/08/2024 0520 by Magalene Schmitt, RN Outcome: Progressing 02/08/2024 0520 by Magalene Schmitt, RN Outcome: Progressing   Problem: Cardiac: Goal: Will achieve and/or maintain adequate cardiac output 02/08/2024 0520 by Magalene Schmitt, RN Outcome: Progressing 02/08/2024 0520 by Magalene Schmitt, RN Outcome: Progressing   Problem: Physical Regulation: Goal: Complications related to the disease process, condition or treatment will be avoided or minimized 02/08/2024 0520 by Magalene Schmitt, RN Outcome: Progressing 02/08/2024 0520 by Magalene Schmitt, RN Outcome: Progressing   Problem: Education: Goal: Knowledge of General Education information will improve Description: Including pain rating scale, medication(s)/side effects and non-pharmacologic comfort measures 02/08/2024 0520 by Magalene Schmitt, RN Outcome: Progressing 02/08/2024 0520 by Magalene Schmitt, RN Outcome: Progressing   Problem: Clinical Measurements: Goal: Ability to maintain clinical measurements within normal limits will improve 02/08/2024 0520 by Magalene Schmitt, RN Outcome: Progressing 02/08/2024 0520 by Magalene Schmitt, RN Outcome: Progressing Goal: Will remain free from infection 02/08/2024 0520 by Magalene Schmitt, RN Outcome: Progressing 02/08/2024 0520 by Magalene Schmitt, RN Outcome: Progressing Goal: Diagnostic test results will improve 02/08/2024 0520 by Magalene Schmitt, RN Outcome: Progressing 02/08/2024 0520 by Magalene Schmitt, RN Outcome: Progressing Goal: Respiratory complications will improve 02/08/2024 0520 by Magalene Schmitt, RN Outcome: Progressing 02/08/2024 0520 by Magalene Schmitt, RN Outcome: Progressing Goal: Cardiovascular complication will be avoided 02/08/2024 0520 by Magalene Schmitt, RN Outcome: Progressing 02/08/2024 0520 by Magalene Schmitt, RN Outcome: Progressing

## 2024-02-09 ENCOUNTER — Observation Stay
Admit: 2024-02-09 | Discharge: 2024-02-09 | Disposition: A | Discharge status: Home / Self Care | Attending: Internal Medicine

## 2024-02-09 DIAGNOSIS — J9811 Atelectasis: Secondary | ICD-10-CM | POA: Diagnosis not present

## 2024-02-09 DIAGNOSIS — H6123 Impacted cerumen, bilateral: Secondary | ICD-10-CM | POA: Diagnosis not present

## 2024-02-09 DIAGNOSIS — M25511 Pain in right shoulder: Secondary | ICD-10-CM | POA: Diagnosis not present

## 2024-02-09 DIAGNOSIS — I5032 Chronic diastolic (congestive) heart failure: Secondary | ICD-10-CM | POA: Diagnosis not present

## 2024-02-09 DIAGNOSIS — R55 Syncope and collapse: Secondary | ICD-10-CM | POA: Diagnosis not present

## 2024-02-09 DIAGNOSIS — H6120 Impacted cerumen, unspecified ear: Secondary | ICD-10-CM | POA: Insufficient documentation

## 2024-02-09 DIAGNOSIS — I10 Essential (primary) hypertension: Secondary | ICD-10-CM | POA: Diagnosis not present

## 2024-02-09 DIAGNOSIS — E119 Type 2 diabetes mellitus without complications: Secondary | ICD-10-CM | POA: Diagnosis not present

## 2024-02-09 DIAGNOSIS — R2681 Unsteadiness on feet: Secondary | ICD-10-CM | POA: Diagnosis not present

## 2024-02-09 DIAGNOSIS — N179 Acute kidney failure, unspecified: Secondary | ICD-10-CM | POA: Diagnosis not present

## 2024-02-09 DIAGNOSIS — I5A Non-ischemic myocardial injury (non-traumatic): Secondary | ICD-10-CM

## 2024-02-09 DIAGNOSIS — R001 Bradycardia, unspecified: Secondary | ICD-10-CM | POA: Diagnosis not present

## 2024-02-09 LAB — ECHOCARDIOGRAM COMPLETE
AR max vel: 2.85 cm2
AV Area VTI: 2.99 cm2
AV Area mean vel: 2.48 cm2
AV Mean grad: 3 mmHg
AV Peak grad: 5.2 mmHg
Ao pk vel: 1.14 m/s
Area-P 1/2: 2.62 cm2
MV VTI: 2.31 cm2
S' Lateral: 2.3 cm
Weight: 1791.9 [oz_av]

## 2024-02-09 LAB — BASIC METABOLIC PANEL WITH GFR
Anion gap: 7 (ref 5–15)
BUN: 25 mg/dL — ABNORMAL HIGH (ref 8–23)
CO2: 26 mmol/L (ref 22–32)
Calcium: 9 mg/dL (ref 8.9–10.3)
Chloride: 103 mmol/L (ref 98–111)
Creatinine, Ser: 0.99 mg/dL (ref 0.44–1.00)
GFR, Estimated: 55 mL/min — ABNORMAL LOW (ref >60)
Glucose, Bld: 114 mg/dL — ABNORMAL HIGH (ref 70–99)
Potassium: 5 mmol/L (ref 3.5–5.1)
Sodium: 136 mmol/L (ref 135–145)

## 2024-02-09 LAB — PROCALCITONIN: Procalcitonin: <0.1 ng/mL

## 2024-02-09 MED ORDER — CARVEDILOL 3.125 MG PO TABS
6.2500 mg | ORAL_TABLET | Freq: Two times a day (BID) | ORAL | Status: DC
  Administered 2024-02-09 – 2024-02-11 (×5): 6.25 mg via ORAL
  Filled 2024-02-09 (×5): qty 2

## 2024-02-09 MED ORDER — CARBAMIDE PEROXIDE 6.5 % OT SOLN
5.0000 [drp] | Freq: Two times a day (BID) | OTIC | Status: DC
  Administered 2024-02-09 – 2024-02-11 (×5): 5 [drp] via OTIC
  Filled 2024-02-09: qty 15

## 2024-02-09 MED ORDER — MAGNESIUM SULFATE 4 GM/100ML IV SOLN
4.0000 g | Freq: Once | INTRAVENOUS | Status: AC
  Administered 2024-02-09: 4 g via INTRAVENOUS
  Filled 2024-02-09: qty 100

## 2024-02-09 MED ORDER — AMLODIPINE BESYLATE 5 MG PO TABS
5.0000 mg | ORAL_TABLET | Freq: Every day | ORAL | Status: DC
  Administered 2024-02-09 – 2024-02-11 (×3): 5 mg via ORAL
  Filled 2024-02-09 (×3): qty 1

## 2024-02-09 NOTE — Assessment & Plan Note (Signed)
Debrox otic solution

## 2024-02-09 NOTE — Progress Notes (Signed)
 Mobility Specialist - Progress Note    02/09/24 1419  Mobility  Activity Ambulated with assistance in hallway  Level of Assistance Contact guard assist, steadying assist  Assistive Device Front wheel walker  Distance Ambulated (ft) 65 ft  Range of Motion/Exercises Active  Activity Response Tolerated well  Mobility Referral Yes  Mobility visit 1 Mobility  Mobility Specialist Start Time (ACUTE ONLY) 1340  Mobility Specialist Stop Time (ACUTE ONLY) 1400  Mobility Specialist Time Calculation (min) (ACUTE ONLY) 20 min   Pt resting in recliner on RA upon entry. Pt STS and ambulates to hallway up to NS CGA with RW. Pt endorses pain in shoulder when baring weight down on walker. Pt maintains upright standing and no buckling during ambulation. Pt returned to recliner and left with needs in reach. Chair alarm activated.   Jerri Morale Mobility Specialist 02/09/24, 2:48 PM

## 2024-02-09 NOTE — Evaluation (Signed)
 Physical Therapy Evaluation Patient Details Name: Tracy Spears MRN: [id number] DOB: August 05, 1933 Today's Date: 02/09/2024  History of Present Illness  Pt is a 89 year old female presented to the ED after GLF, work up includes pre syncope, elevated troponin,     PMH significant for Diet controlled type 2 diabetes, hypertension, hyperlipidemia, previous pericardial effusion, GERD  Clinical Impression  Pt admitted with above diagnosis. Pt currently with functional limitations due to the deficits listed below (see PT Problem List). Pt received upright in recliner agreeable to PT. Reports PTA living with her husband and son being mod-I with her rollator mainly household ambulation. Also independent with ADL's.   To date, pt is supervision for STS to RW with VC's for hand placement ambulating 60' at supervision. Pt with good stability with gait and safe use of RW with ambulation and turns. Pt returns to recliner with R shoulder assessment. Pt reports falling on her shoulder with significant ROM loss with flexion. Pt also unable to externally rotate. PROM in flexion and ER is WNL. Discussed with MD and pt on concerns for possible signs/symptoms that align with RTC tear. Pt declining any further care/assessment at this time. Provided gentle AAROM to attempt to improve ROM. Pt will likely need greater assist levels at home with RUE overhead ADL's due to her limitations but gait/mobility appears safe and close to baseline. Pt with all needs in reach. Rec f/u PT recs to address falls risk and LE weakness.      If plan is discharge home, recommend the following: A little help with walking and/or transfers;A little help with bathing/dressing/bathroom;Help with stairs or ramp for entrance;Assist for transportation;Assistance with cooking/housework   Can travel by private vehicle        Equipment Recommendations None recommended by PT  Recommendations for Other Services       Functional Status Assessment  Patient has had a recent decline in their functional status and demonstrates the ability to make significant improvements in function in a reasonable and predictable amount of time.     Precautions / Restrictions        Mobility  Bed Mobility               General bed mobility comments: NT. In recliner pre and post testing Patient Response: Cooperative  Transfers Overall transfer level: Needs assistance (Simultaneous filing. User may not have seen previous data.)                      Ambulation/Gait Ambulation/Gait assistance: Supervision Gait Distance (Feet): 60 Feet Assistive device: Rolling walker (2 wheels) Gait Pattern/deviations: Step-through pattern, Decreased step length - right, Decreased step length - left          Stairs            Wheelchair Mobility     Tilt Bed Tilt Bed Patient Response: Cooperative  Modified Rankin (Stroke Patients Only)       Balance Overall balance assessment: Needs assistance Sitting-balance support: Feet supported Sitting balance-Leahy Scale: Good     Standing balance support: Bilateral upper extremity supported, During functional activity, Reliant on assistive device for balance Standing balance-Leahy Scale: Fair                               Pertinent Vitals/Pain Pain Assessment Pain Assessment: Faces Faces Pain Scale: Hurts a little bit Pain Location: R shoulder with attempted mobility Pain Descriptors /  Indicators: Discomfort, Grimacing Pain Intervention(s): Monitored during session, Repositioned    Home Living Family/patient expects to be discharged to:: Private residence Living Arrangements: Spouse/significant other;Children (adult son works during the day) Available Help at Discharge: Family;Available PRN/intermittently Type of Home: House Home Access: Level entry       Home Layout: One level Home Equipment: Rollator (4 wheels);Rolling Walker (2 wheels);Hand held shower  head;Grab bars - tub/shower;Shower seat;Cane - single point;Other (comment);Adaptive equipment      Prior Function Prior Level of Function : History of Falls (last six months)             Mobility Comments: amb with rollator household distances ADLs Comments: MOD I with ADL, assist from family with IADLs     Extremity/Trunk Assessment   Upper Extremity Assessment Upper Extremity Assessment: Right hand dominant;RUE deficits/detail;Generalized weakness RUE Deficits / Details: RUE vastly limited in flexion and unable to externally rotated. Anterior bruising/swelling noted at [facility]    Lower Extremity Assessment Lower Extremity Assessment: Generalized weakness       Communication   Communication Communication: No apparent difficulties    Cognition Arousal: Alert Behavior During Therapy: WFL for tasks assessed/performed   PT - Cognitive impairments: No apparent impairments                         Following commands: Intact       Cueing Cueing Techniques: Verbal cues     General Comments General comments (skin integrity, edema, etc.): vss throughout    Exercises Other Exercises Other Exercises: recommendations on R shoulder MRI or orthopedic f/u. Pt declining due to finances.   Assessment/Plan    PT Assessment Patient needs continued PT services  PT Problem List Decreased strength;Decreased mobility;Decreased safety awareness;Decreased range of motion;Decreased activity tolerance;Decreased balance;Pain       PT Treatment Interventions DME instruction;Therapeutic exercise;Gait training;Balance training;Stair training;Neuromuscular re-education;Functional mobility training;Therapeutic activities;Patient/family education    PT Goals (Current goals can be found in the Care Plan section)  Acute Rehab PT Goals Patient Stated Goal: to go home PT Goal Formulation: With patient Time For Goal Achievement: 02/23/24 Potential to Achieve Goals: Good    Frequency  Min 3X/week     Co-evaluation               AM-PAC PT "6 Clicks" Mobility  Outcome Measure Help needed turning from your back to your side while in a flat bed without using bedrails?: A Lot Help needed moving from lying on your back to sitting on the side of a flat bed without using bedrails?: A Lot Help needed moving to and from a bed to a chair (including a wheelchair)?: A Little Help needed standing up from a chair using your arms (e.g., wheelchair or bedside chair)?: A Little Help needed to walk in hospital room?: A Little Help needed climbing 3-5 steps with a railing? : A Lot 6 Click Score: 15    End of Session Equipment Utilized During Treatment: Gait belt Activity Tolerance: Patient tolerated treatment well Patient left: in chair;with call bell/phone within reach;with chair alarm set Nurse Communication: Mobility status PT Visit Diagnosis: Unsteadiness on feet (R26.81);Other abnormalities of gait and mobility (R26.89);History of falling (Z91.81);Difficulty in walking, not elsewhere classified (R26.2);Pain Pain - Right/Left: Right Pain - part of body: Shoulder    Time: 1610-9604 PT Time Calculation (min) (ACUTE ONLY): 28 min   Charges:   PT Evaluation $PT Eval Moderate Complexity: 1 Mod PT Treatments $Therapeutic Activity: 8-22  mins PT General Charges $$ ACUTE PT VISIT: 1 Visit         Marc Senior. Fairly IV, PT, DPT Physical Therapist- [place]  [facility] 02/09/2024, 1:04 PM

## 2024-02-09 NOTE — Assessment & Plan Note (Signed)
 Atelectasis versus pneumonia.  Procalcitonin negative.  Will hold off on antibiotics.  Incentive spirometer.

## 2024-02-09 NOTE — Assessment & Plan Note (Signed)
 Right shoulder pain.  Limited range of motion right shoulder.  Patient did not want to do any further imaging.  May improve over time.  Continue working with PT and OT.  Could be a rotator cuff tear.  X-ray did not show any fracture.

## 2024-02-09 NOTE — Assessment & Plan Note (Signed)
 replaced

## 2024-02-09 NOTE — TOC Progression Note (Signed)
 Transition of Care [facility]) - Progression Note    Patient Details  Name: Tracy Spears MRN: [id number] Date of Birth: 1932-10-17  [facility] CM/SW Contact  Alexandra Ice, RN Phone Number: 02/09/2024, 2:51 PM  Clinical Narrative:    Spoke with patient and discussed home health services. She is agreeable, has no preference on which agency to use. Sent referral to Shaun with [facility], waiting to see if they able to accept.    Expected Discharge Plan: Home/Self Care Barriers to Discharge: Continued Medical Work up  Expected Discharge Plan and Services       Living arrangements for the past 2 months: Single Family Home                   DME Agency: NA                   Social Determinants of Health (SDOH) Interventions SDOH Screenings   Food Insecurity: No Food Insecurity (02/08/2024)  Housing: Low Risk  (02/08/2024)  Transportation Needs: No Transportation Needs (02/08/2024)  Utilities: Not At Risk (02/08/2024)  Social Connections: Unknown (02/08/2024)  Tobacco Use: Low Risk  (11/17/2023)   Received from [facility]    Readmission Risk Interventions     No data to display

## 2024-02-09 NOTE — Assessment & Plan Note (Signed)
 Troponin downtrending.  This is not a myocardial infarction.

## 2024-02-09 NOTE — Evaluation (Signed)
 Occupational Therapy Evaluation Patient Details Name: Tracy Spears MRN: [id number] DOB: 12-25-1932 Today's Date: 02/09/2024   History of Present Illness   Pt is a 89 year old female presented to the ED after GLF, work up includes pre syncope, elevated troponin,     PMH significant for Diet controlled type 2 diabetes, hypertension, hyperlipidemia, previous pericardial effusion, GERD     Clinical Impressions Chart reviewed to date, pt greeted in bed, agreeable to OT evaluation. PTA pt reports she is generally MODI with ADL, has assist for IADLs, amb household distances with rollator. Pt reports RUE pain with attempted AROM,able to tolerate PROM to approx 90 degrees. Pt performs bed mobility with CGA, STS with initially MIN A, progresses to CGA. Pt amb in room approx 10' 2 attempts with RW with CGA. Toileting completed with supervision. Pt endorses she is motivated to return home with family assist. Pt is left on bsc with NT, all needs met. OT will continue to follow.      If plan is discharge home, recommend the following:   A little help with walking and/or transfers;A little help with bathing/dressing/bathroom;Help with stairs or ramp for entrance;Assistance with cooking/housework     Functional Status Assessment   Patient has had a recent decline in their functional status and demonstrates the ability to make significant improvements in function in a reasonable and predictable amount of time.     Equipment Recommendations   None recommended by OT;Other (comment) (pt has recommended equipment)     Recommendations for Other Services         Precautions/Restrictions   Precautions Precautions: Fall Recall of Precautions/Restrictions: Intact Restrictions Weight Bearing Restrictions Per Provider Order: No     Mobility Bed Mobility Overal bed mobility: Needs Assistance Bed Mobility: Supine to Sit     Supine to sit: Contact guard, HOB elevated, Used rails           Transfers   Equipment used: Rolling walker (2 wheels) Transfers: Sit to/from Stand Sit to Stand: Contact guard assist, Min assist (first attempt MIN A, second and future attempts CGA)                  Balance Overall balance assessment: Needs assistance Sitting-balance support: Feet supported Sitting balance-Leahy Scale: Good     Standing balance support: Bilateral upper extremity supported, During functional activity, Reliant on assistive device for balance Standing balance-Leahy Scale: Fair                             ADL either performed or assessed with clinical judgement   ADL Overall ADL's : Needs assistance/impaired Eating/Feeding: Set up;Sitting                   Lower Body Dressing: Moderate assistance Lower Body Dressing Details (indicate cue type and reason): anticipate Toilet Transfer: Contact guard assist;Rolling walker (2 wheels);BSC/3in1;Minimal assistance Toilet Transfer Details (indicate cue type and reason): 2 different attempts to [facility] Toileting- Clothing Manipulation and Hygiene: Supervision/safety;Sitting/lateral lean Toileting - Clothing Manipulation Details (indicate cue type and reason): continent urine     Functional mobility during ADLs: Supervision/safety;Contact guard assist;Rolling walker (2 wheels) (approx 10' two attempts)       Vision Patient Visual Report: No change from baseline       Perception         Praxis         Pertinent Vitals/Pain Pain Assessment Pain Assessment: Faces Faces  Pain Scale: Hurts a little bit Pain Location: R shoulder with attempted mobility Pain Descriptors / Indicators: Discomfort, Grimacing Pain Intervention(s): Monitored during session, Repositioned     Extremity/Trunk Assessment Upper Extremity Assessment Upper Extremity Assessment: Right hand dominant;RUE deficits/detail;Generalized weakness RUE Deficits / Details: AROM: unable to perform shoulder flexion more than 1/4  full AROM, elbow/wrist/hand grossly WFL; PROM: tolerates shoulder flexion to approx 90 degrees   Lower Extremity Assessment Lower Extremity Assessment: Defer to PT evaluation       Communication Communication Communication: No apparent difficulties   Cognition Arousal: Alert Behavior During Therapy: WFL for tasks assessed/performed Cognition: No apparent impairments                               Following commands: Intact       Cueing  General Comments   Cueing Techniques: Verbal cues  vss throughout   Exercises Other Exercises Other Exercises: edu re: role of OT, role of rehab, discharge recommendations, safe ADL completion with DME   Shoulder Instructions      Home Living Family/patient expects to be discharged to:: Private residence Living Arrangements: Spouse/significant other;Children (adult son works during the day) Available Help at Discharge: Family;Available PRN/intermittently Type of Home: House Home Access: Level entry     Home Layout: One level     Bathroom Shower/Tub: Producer, television/film/video: Handicapped height     Home Equipment: Rollator (4 wheels);Rolling Walker (2 wheels);Hand held shower head;Grab bars - tub/shower;Shower seat;Cane - single point;Other (comment);Adaptive equipment Adaptive Equipment: Reacher        Prior Functioning/Environment Prior Level of Function : History of Falls (last six months)             Mobility Comments: amb with rollator household distances ADLs Comments: MOD I with ADL, assist from family with IADLs    OT Problem List: Decreased strength;Decreased activity tolerance;Decreased knowledge of use of DME or AE;Impaired UE functional use;Decreased safety awareness;Impaired balance (sitting and/or standing)   OT Treatment/Interventions: Therapeutic activities;Self-care/ADL training;DME and/or AE instruction;Balance training;Therapeutic exercise;Energy conservation;Patient/family  education;Visual/perceptual remediation/compensation      OT Goals(Current goals can be found in the care plan section)   Acute Rehab OT Goals Patient Stated Goal: go home OT Goal Formulation: With patient Time For Goal Achievement: 02/23/24 Potential to Achieve Goals: Good ADL Goals Pt Will Perform Grooming: with modified independence;sitting Pt Will Perform Lower Body Dressing: with modified independence;sitting/lateral leans;sit to/from stand Pt Will Transfer to Toilet: with modified independence;ambulating Pt Will Perform Toileting - Clothing Manipulation and hygiene: with modified independence;sitting/lateral leans;sit to/from stand   OT Frequency:  Min 3X/week    Co-evaluation              AM-PAC OT "6 Clicks" Daily Activity     Outcome Measure Help from another person eating meals?: None Help from another person taking care of personal grooming?: None Help from another person toileting, which includes using toliet, bedpan, or urinal?: None Help from another person bathing (including washing, rinsing, drying)?: A Little Help from another person to put on and taking off regular upper body clothing?: None Help from another person to put on and taking off regular lower body clothing?: A Little 6 Click Score: 22   End of Session Equipment Utilized During Treatment: Rolling walker (2 wheels);Gait belt Nurse Communication: Mobility status  Activity Tolerance: Patient tolerated treatment well Patient left: in chair;with call bell/phone within reach;with chair alarm  set;with nursing/sitter in room  OT Visit Diagnosis: Other abnormalities of gait and mobility (R26.89);Muscle weakness (generalized) (M62.81)                Time: 5409-8119 OT Time Calculation (min): 32 min Charges:  OT General Charges $OT Visit: 1 Visit OT Evaluation $OT Eval Moderate Complexity: 1 Mod  Gerre Kraft, OTD OTR/L  02/09/24, 12:29 PM

## 2024-02-09 NOTE — Hospital Course (Signed)
 89 y.o. female with medical history significant of Diet controlled type 2 diabetes, hypertension, hyperlipidemia, previous pericardial effusion, GERD, who presents to the ED due to ground-level fall.   Tracy Spears states that she was in her normal state of health this morning when she stood up to grab her shoes but was unable to reach them.  At some point after standing, she began to feel dizzy and believes she tried to sit down but missed the bed.  She is notes that somehow she fell face forward hitting her face on the floor.  She adamantly denies losing consciousness.  Due to this, EMS was called...." See H&P for full HPI on admission & ED course.   Patient was admitted for further evaluation and management of suspected pre-syncopal episode leading to her fall.   6/4.  Patient's blood pressure this morning was 196/62.  I had to decrease her Coreg  dose secondary to bradycardia.  Added Norvasc .  Patient complained of vertigo room spinning counterclockwise prior to falling to the ground at home.  Patient was not orthostatic. 6/5.  On reevaluation the afternoon patient was unsteady with her walking and almost fell forward and was dragging her right foot.  CT scan was negative.  MRI of the brain was negative for acute event.  Did show atrophy.  Patient was given a fluid bolus for acute kidney injury. 6/6.  Patient feeling better.  Did better with physical therapy and discharged home.

## 2024-02-09 NOTE — Progress Notes (Signed)
 Progress Note   Patient: Tracy Spears:[id number] DOB: 1932-10-04 DOA: 02/07/2024     0 DOS: the patient was seen and examined on 02/09/2024   Brief hospital course: 89 y.o. female with medical history significant of Diet controlled type 2 diabetes, hypertension, hyperlipidemia, previous pericardial effusion, GERD, who presents to the ED due to ground-level fall.   Tracy Spears states that she was in her normal state of health this morning when she stood up to grab her shoes but was unable to reach them.  At some point after standing, she began to feel dizzy and believes she tried to sit down but missed the bed.  She is notes that somehow she fell face forward hitting her face on the floor.  She adamantly denies losing consciousness.  Due to this, EMS was called...." See H&P for full HPI on admission & ED course.   Patient was admitted for further evaluation and management of suspected pre-syncopal episode leading to her fall.   6/4.  Patient's blood pressure this morning was 196/62.  I had to decrease her Coreg  dose secondary to bradycardia.  Added Norvasc .  Patient complained of vertigo room spinning counterclockwise prior to falling to the ground at home.  Patient was not orthostatic.  Assessment and Plan: * Accelerated hypertension Blood pressure very high this morning at 196/62.  Patient is not orthostatic.  I needed to decrease dose of Coreg  secondary to bradycardia.  Added Norvasc .  Sinus bradycardia Cut back Coreg  dose to 6.25 mg twice a day instead of 25 mg twice a day.  Pre-syncope Patient complains of vertigo prior to fall.  No recurrence of vertigo so far will hold off on further treatment.  Tympanic membrane could not be visualized.  (HFpEF) heart failure with preserved ejection fraction (HCC) EF 60%.  Patient is euvolemic.  Decrease Coreg  dose.  Continue losartan .  Myocardial injury Troponin downtrending.  This is not a myocardial infarction.  Cerumen  impaction Debrox otic solution  Atelectasis Atelectasis versus pneumonia.  Procalcitonin negative.  Will hold off on antibiotics.  Incentive spirometer.  Hypomagnesemia IV magnesium  replacement today  Right shoulder pain Right shoulder pain.  Limited range of motion right shoulder.  Patient did not want to do any further imaging.  May improve over time.  Continue working with PT and OT.  Could be a rotator cuff tear.  X-ray did not show any fracture.  Diabetes mellitus type 2, noninsulin dependent (HCC) Diet-controlled type 2 diabetes.  Hemoglobin A1c 7.1        Subjective: Patient complained of headache this morning.  Also complained of room spinning when she fell.  Admitted with presyncopal event which could be vertigo.  Has bruise on right forehead.  Blood pressure high this morning.  Physical Exam: Vitals:   02/09/24 0802 02/09/24 0900 02/09/24 0915 02/09/24 1127  BP: (!) 159/61 (!) 198/59 (!) 196/62 (!) 134/51  Pulse: (!) 51   64  Resp: 16   16  Temp: 97.8 F (36.6 C)   98 F (36.7 C)  TempSrc: Oral   Oral  SpO2: 96%   95%  Weight:       Physical Exam HENT:     Head: Normocephalic.     Ears:     Comments: Tympanic membrane obscured by wax    Mouth/Throat:     Pharynx: No oropharyngeal exudate.  Eyes:     General: Lids are normal.     Conjunctiva/sclera: Conjunctivae normal.  Cardiovascular:  Rate and Rhythm: Normal rate and regular rhythm.     Heart sounds: Normal heart sounds, S1 normal and S2 normal.  Pulmonary:     Breath sounds: No decreased breath sounds, wheezing, rhonchi or rales.  Abdominal:     Palpations: Abdomen is soft.     Tenderness: There is no abdominal tenderness.  Musculoskeletal:     Right lower leg: No swelling.     Left lower leg: No swelling.     Comments: Poor range of motion right shoulder.  Able to get better range of motion with passive range of motion of the right shoulder but painful.  Skin:    General: Skin is warm.      Comments: Bruise right forehead  Neurological:     Mental Status: She is alert and oriented to person, place, and time.     Data Reviewed: Sodium 136, potassium 5.0, creatinine 0.99, GFR 55, procalcitonin negative, D-dimer 2.38, white blood cell count 9.0, hemoglobin 12.1, platelet count 173.  Family Communication: Tried to reach husband  Disposition: Status is: Observation Blood pressure very high this morning.  Needed to decrease Coreg  dose secondary to bradycardia.  Added Norvasc .  Planned Discharge Destination: Home with Home Health    Time spent: 28 minutes  Author: Verla Glaze, MD 02/09/2024 2:41 PM  For on call review [email or web address].

## 2024-02-09 NOTE — Plan of Care (Signed)
  Problem: Education: Goal: Knowledge of condition and prescribed therapy will improve Outcome: Progressing   Problem: Cardiac: Goal: Will achieve and/or maintain adequate cardiac output Outcome: Progressing   Problem: Physical Regulation: Goal: Complications related to the disease process, condition or treatment will be avoided or minimized Outcome: Progressing   Problem: Education: Goal: Knowledge of General Education information will improve Description: Including pain rating scale, medication(s)/side effects and non-pharmacologic comfort measures Outcome: Progressing   Problem: Health Behavior/Discharge Planning: Goal: Ability to manage health-related needs will improve Outcome: Progressing   Problem: Activity: Goal: Risk for activity intolerance will decrease Outcome: Progressing   Problem: Nutrition: Goal: Adequate nutrition will be maintained Outcome: Progressing   Problem: Pain Managment: Goal: General experience of comfort will improve and/or be controlled Outcome: Progressing   Problem: Safety: Goal: Ability to remain free from injury will improve Outcome: Progressing   Problem: Skin Integrity: Goal: Risk for impaired skin integrity will decrease Outcome: Progressing

## 2024-02-10 ENCOUNTER — Observation Stay

## 2024-02-10 DIAGNOSIS — G319 Degenerative disease of nervous system, unspecified: Secondary | ICD-10-CM | POA: Diagnosis not present

## 2024-02-10 DIAGNOSIS — S0990XA Unspecified injury of head, initial encounter: Secondary | ICD-10-CM | POA: Diagnosis not present

## 2024-02-10 DIAGNOSIS — I6782 Cerebral ischemia: Secondary | ICD-10-CM | POA: Diagnosis not present

## 2024-02-10 DIAGNOSIS — R2681 Unsteadiness on feet: Secondary | ICD-10-CM | POA: Diagnosis not present

## 2024-02-10 DIAGNOSIS — R55 Syncope and collapse: Secondary | ICD-10-CM | POA: Diagnosis not present

## 2024-02-10 DIAGNOSIS — S0083XA Contusion of other part of head, initial encounter: Secondary | ICD-10-CM | POA: Diagnosis not present

## 2024-02-10 DIAGNOSIS — N179 Acute kidney failure, unspecified: Secondary | ICD-10-CM

## 2024-02-10 DIAGNOSIS — J9811 Atelectasis: Secondary | ICD-10-CM | POA: Diagnosis not present

## 2024-02-10 DIAGNOSIS — I5032 Chronic diastolic (congestive) heart failure: Secondary | ICD-10-CM | POA: Diagnosis not present

## 2024-02-10 DIAGNOSIS — I5A Non-ischemic myocardial injury (non-traumatic): Secondary | ICD-10-CM | POA: Diagnosis not present

## 2024-02-10 DIAGNOSIS — M25511 Pain in right shoulder: Secondary | ICD-10-CM | POA: Diagnosis not present

## 2024-02-10 DIAGNOSIS — R001 Bradycardia, unspecified: Secondary | ICD-10-CM | POA: Diagnosis not present

## 2024-02-10 DIAGNOSIS — I10 Essential (primary) hypertension: Secondary | ICD-10-CM | POA: Diagnosis not present

## 2024-02-10 DIAGNOSIS — H6123 Impacted cerumen, bilateral: Secondary | ICD-10-CM | POA: Diagnosis not present

## 2024-02-10 DIAGNOSIS — E119 Type 2 diabetes mellitus without complications: Secondary | ICD-10-CM | POA: Diagnosis not present

## 2024-02-10 LAB — BASIC METABOLIC PANEL WITH GFR
Anion gap: 8 (ref 5–15)
BUN: 32 mg/dL — ABNORMAL HIGH (ref 8–23)
CO2: 25 mmol/L (ref 22–32)
Calcium: 9.1 mg/dL (ref 8.9–10.3)
Chloride: 104 mmol/L (ref 98–111)
Creatinine, Ser: 1.09 mg/dL — ABNORMAL HIGH (ref 0.44–1.00)
GFR, Estimated: 49 mL/min — ABNORMAL LOW (ref >60)
Glucose, Bld: 131 mg/dL — ABNORMAL HIGH (ref 70–99)
Potassium: 4.9 mmol/L (ref 3.5–5.1)
Sodium: 137 mmol/L (ref 135–145)

## 2024-02-10 LAB — GLUCOSE, CAPILLARY: Glucose-Capillary: 121 mg/dL — ABNORMAL HIGH (ref 70–99)

## 2024-02-10 LAB — MAGNESIUM: Magnesium: 2.5 mg/dL — ABNORMAL HIGH (ref 1.7–2.4)

## 2024-02-10 LAB — HEMOGLOBIN: Hemoglobin: 10.3 g/dL — ABNORMAL LOW (ref 12.0–15.0)

## 2024-02-10 MED ORDER — GABAPENTIN 100 MG PO CAPS
200.0000 mg | ORAL_CAPSULE | Freq: Two times a day (BID) | ORAL | Status: DC
  Administered 2024-02-11: 200 mg via ORAL
  Filled 2024-02-10 (×2): qty 2

## 2024-02-10 MED ORDER — ACETAMINOPHEN 650 MG RE SUPP: 650.0000 mg | Freq: Four times a day (QID) | RECTAL | Status: DC | PRN

## 2024-02-10 MED ORDER — SODIUM CHLORIDE 0.9 % IV BOLUS
500.0000 mL | Freq: Once | INTRAVENOUS | Status: AC
  Administered 2024-02-10: 500 mL via INTRAVENOUS

## 2024-02-10 MED ORDER — ACETAMINOPHEN 325 MG PO TABS: 650.0000 mg | ORAL_TABLET | Freq: Four times a day (QID) | ORAL | Status: DC | PRN

## 2024-02-10 NOTE — Progress Notes (Signed)
 Occupational Therapy Treatment Patient Details Name: SHUNTELL FOODY MRN: [id number] DOB: August 07, 1933 Today's Date: 02/10/2024   History of present illness Pt is a 89 year old female presented to the ED after GLF, work up includes pre syncope, elevated troponin,     PMH significant for Diet controlled type 2 diabetes, hypertension, hyperlipidemia, previous pericardial effusion, GERD   OT comments  Chart reviewed to date, pt greeted semi supine in bed, agreeable to OT tx session targeting improving ADL performance. Pt continues to present with deficits in RUE function with pt educated that further follow up may be warranted. Improvements noted throughout with pt amb in room with RW with supervision-CGA, toileting with supervision, standing grooming tasks with supervision-CGA with RW. LB dressing completed with MIN A, pt reports she uses a reacher at home. Pt also requires MIN A for bed mobility, she reports she can sleep in her lift chair if needed. Pt is left in chair all needs met however transport in room at end of session and pt assisted back to bed via step pivot with RW with supervision. Pt is making progress towards goals, discharge recommendation remains appropriated .      If plan is discharge home, recommend the following:  A little help with walking and/or transfers;A little help with bathing/dressing/bathroom;Help with stairs or ramp for entrance;Assistance with cooking/housework   Equipment Recommendations  None recommended by OT;Other (comment) (pt has recommended equipment)    Recommendations for Other Services      Precautions / Restrictions Precautions Precautions: Fall Recall of Precautions/Restrictions: Intact Restrictions Weight Bearing Restrictions Per Provider Order: No       Mobility Bed Mobility Overal bed mobility: Needs Assistance Bed Mobility: Supine to Sit, Sit to Supine     Supine to sit: MIN assist, HOB elevated Sit to supine: HOB elevated, Min assist         Transfers Overall transfer level: Needs assistance Equipment used: Rolling walker (2 wheels) Transfers: Sit to/from Stand Sit to Stand: Min assist                 Balance Overall balance assessment: Needs assistance Sitting-balance support: Feet supported Sitting balance-Leahy Scale: Good     Standing balance support: Bilateral upper extremity supported, During functional activity, Reliant on assistive device for balance Standing balance-Leahy Scale: Fair                             ADL either performed or assessed with clinical judgement   ADL Overall ADL's : Needs assistance/impaired Eating/Feeding: Set up;Sitting   Grooming: Wash/dry hands;Standing;Supervision/safety;Contact guard assist Grooming Details (indicate cue type and reason): with RW at sink             Lower Body Dressing: Minimal assistance;Sitting/lateral leans Lower Body Dressing Details (indicate cue type and reason): underwear, pt reports she uses reacher at home Toilet Transfer: Contact guard assist;Rolling walker (2 wheels);Regular Toilet;Minimal assistance   Toileting- Clothing Manipulation and Hygiene: Supervision/safety;Sitting/lateral lean       Functional mobility during ADLs: Supervision/safety;Contact guard assist;Rolling walker (2 wheels) (approx 20' two attempts)      Extremity/Trunk Assessment Upper Extremity Assessment RUE Deficits / Details: AROM to approx 90, pain with all attempted mobility but utilizes functionally   Lower Extremity Assessment Lower Extremity Assessment: Generalized weakness        Vision Patient Visual Report: No change from baseline     Perception     Praxis  Communication Communication Communication: No apparent difficulties   Cognition Arousal: Alert Behavior During Therapy: WFL for tasks assessed/performed Cognition: No apparent impairments                               Following commands: Intact         Cueing   Cueing Techniques: Verbal cues  Exercises Other Exercises Other Exercises: edu re: role of OT, role of rehab, R shoulder follow up, safe ADL completion with DME/AE    Shoulder Instructions       General Comments vss throughout    Pertinent Vitals/ Pain       Pain Assessment Pain Assessment: Faces Faces Pain Scale: Hurts a little bit Pain Location: R shoulder Pain Descriptors / Indicators: Discomfort, Grimacing Pain Intervention(s): Monitored during session, Repositioned  Home Living                                          Prior Functioning/Environment              Frequency  Min 3X/week        Progress Toward Goals  OT Goals(current goals can now be found in the care plan section)  Progress towards OT goals: Progressing toward goals  Acute Rehab OT Goals Time For Goal Achievement: 02/23/24  Plan      Co-evaluation                 AM-PAC OT "6 Clicks" Daily Activity     Outcome Measure   Help from another person eating meals?: None   Help from another person toileting, which includes using toliet, bedpan, or urinal?: None Help from another person bathing (including washing, rinsing, drying)?: A Little Help from another person to put on and taking off regular upper body clothing?: None Help from another person to put on and taking off regular lower body clothing?: A Little 6 Click Score: 18    End of Session Equipment Utilized During Treatment: Rolling walker (2 wheels)  OT Visit Diagnosis: Other abnormalities of gait and mobility (R26.89);Muscle weakness (generalized) (M62.81)   Activity Tolerance Patient tolerated treatment well   Patient Left in bed;with call bell/phone within reach;Other (comment) (with transport)   Nurse Communication Other (comment) (MD/PT re: R shoulder f/u)        Time: 1022-1050 OT Time Calculation (min): 28 min  Charges: OT General Charges $OT Visit: 1 Visit OT  Treatments $Self Care/Home Management : 23-37 mins  Gerre Kraft, OTD OTR/L  02/10/24, 11:58 AM

## 2024-02-10 NOTE — Progress Notes (Signed)
 Physical Therapy Treatment Patient Details Name: Tracy Spears MRN: [id number] DOB: 1933/01/12 Today's Date: 02/10/2024   History of Present Illness Pt is a 89 year old female presented to the ED after GLF, work up includes pre syncope, elevated troponin,     PMH significant for Diet controlled type 2 diabetes, hypertension, hyperlipidemia, previous pericardial effusion, GERD    PT Comments  Pt received upright in bed agreeable to PT. Began session reviewing shoulder AAROM exercises. Pt with significant limitations even with AAROM and pain. Pt continues to report not wanting her R shoulder assessed. Pt is able to mobilize to EOB at [facility] and stand to RW CGA and ambulate ~120' at supervision. Pt does need heavy education on energy conservation and strategies to utilize here and in home to prevent falls. Pt verbalizes understanding. Pt returns to recliner with all needs in reach. D/c recs remain appropriate.    If plan is discharge home, recommend the following: A little help with walking and/or transfers;A little help with bathing/dressing/bathroom;Help with stairs or ramp for entrance;Assist for transportation;Assistance with cooking/housework   Can travel by private vehicle        Equipment Recommendations  None recommended by PT    Recommendations for Other Services       Precautions / Restrictions Precautions Precautions: Fall Recall of Precautions/Restrictions: Intact Restrictions Weight Bearing Restrictions Per Provider Order: No     Mobility  Bed Mobility Overal bed mobility: Needs Assistance Bed Mobility: Supine to Sit     Supine to sit: Contact guard, HOB elevated       Patient Response: Cooperative  Transfers Overall transfer level: Needs assistance Equipment used: Rolling walker (2 wheels) Transfers: Sit to/from Stand Sit to Stand: Contact guard assist                Ambulation/Gait Ambulation/Gait assistance: Supervision Gait Distance (Feet): 120  Feet Assistive device: Rolling walker (2 wheels) Gait Pattern/deviations: Step-through pattern, Decreased step length - right, Decreased step length - left       General Gait Details: Pt encouraged to return to room due to gait reducing in step lengths and crouched posture due to fatigue.   Stairs             Wheelchair Mobility     Tilt Bed Tilt Bed Patient Response: Cooperative  Modified Rankin (Stroke Patients Only)       Balance Overall balance assessment: Needs assistance Sitting-balance support: Feet supported Sitting balance-Leahy Scale: Good     Standing balance support: Bilateral upper extremity supported, During functional activity, Reliant on assistive device for balance Standing balance-Leahy Scale: Fair                              Hotel manager: No apparent difficulties  Cognition Arousal: Alert Behavior During Therapy: WFL for tasks assessed/performed   PT - Cognitive impairments: No apparent impairments                         Following commands: Intact      Cueing Cueing Techniques: Verbal cues  Exercises Other Exercises Other Exercises: reclined AAROM shoulder flexion: 5 reps Other Exercises: Energy conservation techniques and using seat in rollator for seated rest as needed.    General Comments        Pertinent Vitals/Pain Pain Assessment Pain Assessment: Faces Faces Pain Scale: Hurts a little bit Pain Location: R shoulder Pain Descriptors /  Indicators: Discomfort, Grimacing Pain Intervention(s): Monitored during session, Repositioned    Home Living                          Prior Function            PT Goals (current goals can now be found in the care plan section) Acute Rehab PT Goals Patient Stated Goal: to go home PT Goal Formulation: With patient Time For Goal Achievement: 02/23/24 Potential to Achieve Goals: Good Progress towards PT goals: Progressing  toward goals    Frequency    Min 3X/week      PT Plan      Co-evaluation              AM-PAC PT "6 Clicks" Mobility   Outcome Measure  Help needed turning from your back to your side while in a flat bed without using bedrails?: A Lot Help needed moving from lying on your back to sitting on the side of a flat bed without using bedrails?: A Lot Help needed moving to and from a bed to a chair (including a wheelchair)?: A Little Help needed standing up from a chair using your arms (e.g., wheelchair or bedside chair)?: A Little Help needed to walk in hospital room?: A Little Help needed climbing 3-5 steps with a railing? : A Lot 6 Click Score: 15    End of Session Equipment Utilized During Treatment: Gait belt Activity Tolerance: Patient tolerated treatment well Patient left: in chair;with call bell/phone within reach;with chair alarm set Nurse Communication: Mobility status PT Visit Diagnosis: Unsteadiness on feet (R26.81);Other abnormalities of gait and mobility (R26.89);History of falling (Z91.81);Difficulty in walking, not elsewhere classified (R26.2);Pain Pain - Right/Left: Right Pain - part of body: Shoulder     Time: 1315-1340 PT Time Calculation (min) (ACUTE ONLY): 25 min  Charges:    $Therapeutic Activity: 23-37 mins PT General Charges $$ ACUTE PT VISIT: 1 Visit                     Marc Senior. Fairly IV, PT, DPT Physical Therapist- [place]  [facility]  02/10/2024, 3:53 PM

## 2024-02-10 NOTE — Assessment & Plan Note (Addendum)
 Fluid bolus today.  Hold losartan .  Today's creatinine 1.09.  Creatinine on presentation 0.72.

## 2024-02-10 NOTE — Plan of Care (Signed)

## 2024-02-10 NOTE — Assessment & Plan Note (Signed)
 Patient shuffling especially with the right foot with dragging a little bit.  Almost fell forward and off balance.  Will get an MRI of the brain to rule out stroke.  CT scan of the head negative.  Could also be a concussion causing her woozy feeling.

## 2024-02-10 NOTE — Progress Notes (Signed)
 Progress Note   Patient: Tracy Spears AOZ:[id number] DOB: 06-22-1933 DOA: 02/07/2024     0 DOS: the patient was seen and examined on 02/10/2024   Brief hospital course: 89 y.o. female with medical history significant of Diet controlled type 2 diabetes, hypertension, hyperlipidemia, previous pericardial effusion, GERD, who presents to the ED due to ground-level fall.   Tracy Spears states that she was in her normal state of health this morning when she stood up to grab her shoes but was unable to reach them.  At some point after standing, she began to feel dizzy and believes she tried to sit down but missed the bed.  She is notes that somehow she fell face forward hitting her face on the floor.  She adamantly denies losing consciousness.  Due to this, EMS was called...." See H&P for full HPI on admission & ED course.   Patient was admitted for further evaluation and management of suspected pre-syncopal episode leading to her fall.   6/4.  Patient's blood pressure this morning was 196/62.  I had to decrease her Coreg  dose secondary to bradycardia.  Added Norvasc .  Patient complained of vertigo room spinning counterclockwise prior to falling to the ground at home.  Patient was not orthostatic.  Assessment and Plan: * Unsteady gait when walking Patient shuffling especially with the right foot with dragging a little bit.  Almost fell forward and off balance.  Will get an MRI of the brain to rule out stroke.  CT scan of the head negative.  Could also be a concussion causing her woozy feeling.  Accelerated hypertension Blood pressure better today.  Not orthostatic.  Continue lower dose Coreg  and Norvasc .  Holding losartan  with acute kidney injury.  Sinus bradycardia Cut back Coreg  dose to 6.25 mg twice a day instead of 25 mg twice a day.  Heart rate in the 60s today.  Pre-syncope Patient complains of vertigo prior to fall.  No recurrence of vertigo but almost fell over today with walking.  AKI  (acute kidney injury) (HCC) Fluid bolus today.  Hold losartan .  Today's creatinine 1.09.  Creatinine on presentation 0.72.  (HFpEF) heart failure with preserved ejection fraction (HCC) EF 60%.  Patient is euvolemic.  Decrease Coreg  dose.  Held losartan  with acute kidney injury  Myocardial injury Troponin downtrending.  This is not a myocardial infarction.  Cerumen impaction Debrox otic solution  Atelectasis Atelectasis versus pneumonia.  Procalcitonin negative.  Will hold off on antibiotics.  Incentive spirometer.  Hypomagnesemia replaced  Right shoulder pain Right shoulder pain.  Limited range of motion right shoulder.  Patient did not want to do any further imaging.  May improve over time.  Continue working with PT and OT.  Could be a rotator cuff tear.  X-ray did not show any fracture.  Will refer to orthopedics upon discharge.  Diabetes mellitus type 2, noninsulin dependent (HCC) Diet-controlled type 2 diabetes.  Hemoglobin A1c 7.1        Subjective: Patient felt tired this morning.  She did not sleep well for 2 nights but slept very hard last night.  CT scan of the head was negative for subdural.  On reevaluation this afternoon, the patient felt woozy with standing up.  Patient was walked by myself and the nursing staff and she almost fell over losing her balance forward.  Will obtain an MRI of the brain.  Physical Exam: Vitals:   02/10/24 0442 02/10/24 0750 02/10/24 1207 02/10/24 1407  BP:  (!) 122/55 [name])  140/66 (!) 164/64  Pulse:  63 65 72  Resp:  15 16   Temp:  98.6 F (37 C) 97.9 F (36.6 C)   TempSrc:  Oral Oral   SpO2:  94% 94% 91%  Weight: 54.3 kg      Physical Exam HENT:     Head: Normocephalic.     Ears:     Comments: Tympanic membrane obscured by wax    Mouth/Throat:     Pharynx: No oropharyngeal exudate.  Eyes:     General: Lids are normal.     Conjunctiva/sclera: Conjunctivae normal.  Cardiovascular:     Rate and Rhythm: Normal rate and regular  rhythm.     Heart sounds: S1 normal and S2 normal. Murmur heard.     Systolic murmur is present with a grade of 2/6.  Pulmonary:     Breath sounds: No decreased breath sounds, wheezing, rhonchi or rales.  Abdominal:     Palpations: Abdomen is soft.     Tenderness: There is no abdominal tenderness.  Musculoskeletal:     Right lower leg: No swelling.     Left lower leg: No swelling.     Comments: Poor range of motion right shoulder.  Able to get better range of motion with passive range of motion of the right shoulder but painful.  Skin:    General: Skin is warm.     Comments: Bruise right forehead  Neurological:     Mental Status: She is alert and oriented to person, place, and time.     Comments: Patient had a slow gait with shuffling with the right foot especially.  Almost fell over with walking.     Data Reviewed: CT scan of the head negative Creatinine up to 1.09, magnesium  2.5, hemoglobin 10.3  Family Communication: Husband at bedside this morning and again in the afternoon  Disposition: Status is: Observation Patient almost fell over with walking with me this afternoon.  CT scan of the head negative.  Will obtain an MRI of the brain.  Planned Discharge Destination: Home with Home Health    Time spent: 40 minutes  Author: Verla Glaze, MD 02/10/2024 2:23 PM  For on call review [email or web address].

## 2024-02-10 NOTE — Plan of Care (Signed)
  Problem: Education: Goal: Knowledge of condition and prescribed therapy will improve Outcome: Progressing   Problem: Pain Managment: Goal: General experience of comfort will improve and/or be controlled Outcome: Progressing   Problem: Safety: Goal: Ability to remain free from injury will improve Outcome: Progressing   Problem: Skin Integrity: Goal: Risk for impaired skin integrity will decrease Outcome: Progressing

## 2024-02-11 DIAGNOSIS — I5A Non-ischemic myocardial injury (non-traumatic): Secondary | ICD-10-CM | POA: Diagnosis not present

## 2024-02-11 DIAGNOSIS — R55 Syncope and collapse: Secondary | ICD-10-CM | POA: Diagnosis not present

## 2024-02-11 DIAGNOSIS — I5032 Chronic diastolic (congestive) heart failure: Secondary | ICD-10-CM | POA: Diagnosis not present

## 2024-02-11 DIAGNOSIS — N179 Acute kidney failure, unspecified: Secondary | ICD-10-CM | POA: Diagnosis not present

## 2024-02-11 DIAGNOSIS — E119 Type 2 diabetes mellitus without complications: Secondary | ICD-10-CM | POA: Diagnosis not present

## 2024-02-11 DIAGNOSIS — R001 Bradycardia, unspecified: Secondary | ICD-10-CM | POA: Diagnosis not present

## 2024-02-11 DIAGNOSIS — I10 Essential (primary) hypertension: Secondary | ICD-10-CM | POA: Diagnosis not present

## 2024-02-11 DIAGNOSIS — H6123 Impacted cerumen, bilateral: Secondary | ICD-10-CM | POA: Diagnosis not present

## 2024-02-11 DIAGNOSIS — M25511 Pain in right shoulder: Secondary | ICD-10-CM | POA: Diagnosis not present

## 2024-02-11 DIAGNOSIS — J9811 Atelectasis: Secondary | ICD-10-CM | POA: Diagnosis not present

## 2024-02-11 DIAGNOSIS — R2681 Unsteadiness on feet: Secondary | ICD-10-CM | POA: Diagnosis not present

## 2024-02-11 LAB — BASIC METABOLIC PANEL WITH GFR
Anion gap: 7 (ref 5–15)
BUN: 26 mg/dL — ABNORMAL HIGH (ref 8–23)
CO2: 24 mmol/L (ref 22–32)
Calcium: 9 mg/dL (ref 8.9–10.3)
Chloride: 105 mmol/L (ref 98–111)
Creatinine, Ser: 0.84 mg/dL (ref 0.44–1.00)
GFR, Estimated: >60 mL/min (ref >60)
Glucose, Bld: 140 mg/dL — ABNORMAL HIGH (ref 70–99)
Potassium: 4.3 mmol/L (ref 3.5–5.1)
Sodium: 136 mmol/L (ref 135–145)

## 2024-02-11 LAB — CBC
HCT: 31.4 % — ABNORMAL LOW (ref 36.0–46.0)
Hemoglobin: 10.4 g/dL — ABNORMAL LOW (ref 12.0–15.0)
MCH: 29.2 pg (ref 26.0–34.0)
MCHC: 33.1 g/dL (ref 30.0–36.0)
MCV: 88.2 fL (ref 80.0–100.0)
Platelets: 131 10*3/uL — ABNORMAL LOW (ref 150–400)
RBC: 3.56 MIL/uL — ABNORMAL LOW (ref 3.87–5.11)
RDW: 15.8 % — ABNORMAL HIGH (ref 11.5–15.5)
WBC: 6.7 10*3/uL (ref 4.0–10.5)
nRBC: 0 % (ref 0.0–0.2)

## 2024-02-11 LAB — GLUCOSE, CAPILLARY: Glucose-Capillary: 147 mg/dL — ABNORMAL HIGH (ref 70–99)

## 2024-02-11 MED ORDER — METFORMIN HCL ER 500 MG PO TB24
1500.0000 mg | ORAL_TABLET | Freq: Every evening | ORAL | Status: AC
Start: 2024-02-11 — End: ?

## 2024-02-11 MED ORDER — CARVEDILOL 6.25 MG PO TABS: 6.2500 mg | ORAL_TABLET | Freq: Two times a day (BID) | ORAL | 0 refills | Status: AC

## 2024-02-11 MED ORDER — AMLODIPINE BESYLATE 2.5 MG PO TABS: 2.5000 mg | ORAL_TABLET | Freq: Every day | ORAL | 0 refills | Status: AC

## 2024-02-11 MED ORDER — GABAPENTIN 300 MG PO CAPS: 300.0000 mg | ORAL_CAPSULE | Freq: Two times a day (BID) | ORAL | Status: AC

## 2024-02-11 MED ORDER — CARBAMIDE PEROXIDE 6.5 % OT SOLN: 5.0000 [drp] | Freq: Two times a day (BID) | OTIC | 0 refills | Status: AC

## 2024-02-11 MED ORDER — ACETAMINOPHEN 325 MG PO TABS: 650.0000 mg | ORAL_TABLET | Freq: Four times a day (QID) | ORAL | Status: AC | PRN

## 2024-02-11 NOTE — Discharge Summary (Signed)
 Physician Discharge Summary   Patient: Tracy Spears MRN: [id number] DOB: 1933/03/07  Admit date:     02/07/2024  Discharge date: 02/11/24  Discharge Physician: Verla Glaze   PCP: Jimmy Moulding, MD   Recommendations at discharge:   Follow-up PCP 5 days Refer to orthopedic surgery as outpatient  Discharge Diagnoses: Principal Problem:   Unsteady gait when walking Active Problems:   Accelerated hypertension   Sinus bradycardia   AKI (acute kidney injury) (HCC)   Pre-syncope   (HFpEF) heart failure with preserved ejection fraction (HCC)   Myocardial injury   Diabetes mellitus type 2, noninsulin dependent (HCC)   Right shoulder pain   Hypomagnesemia   Atelectasis   Cerumen impaction  Resolved Problems:   * No resolved hospital problems. *  Hospital Course: 89 y.o. female with medical history significant of Diet controlled type 2 diabetes, hypertension, hyperlipidemia, previous pericardial effusion, GERD, who presents to the ED due to ground-level fall.   Mrs. Better states that she was in her normal state of health this morning when she stood up to grab her shoes but was unable to reach them.  At some point after standing, she began to feel dizzy and believes she tried to sit down but missed the bed.  She is notes that somehow she fell face forward hitting her face on the floor.  She adamantly denies losing consciousness.  Due to this, EMS was called...." See H&P for full HPI on admission & ED course.   Patient was admitted for further evaluation and management of suspected pre-syncopal episode leading to her fall.   6/4.  Patient's blood pressure this morning was 196/62.  I had to decrease her Coreg  dose secondary to bradycardia.  Added Norvasc .  Patient complained of vertigo room spinning counterclockwise prior to falling to the ground at home.  Patient was not orthostatic. 6/5.  On reevaluation the afternoon patient was unsteady with her walking and almost fell  forward and was dragging her right foot.  CT scan was negative.  MRI of the brain was negative for acute event.  Did show atrophy.  Patient was given a fluid bolus for acute kidney injury. 6/6.  Patient feeling better.  Did better with physical therapy and discharged home.  Assessment and Plan: * Unsteady gait when walking Patient shuffling especially with the right foot with dragging a little bit and almost fell over on reevaluation on 6/5 in the afternoon.  I think this may have been related to pain medications.  CT scan and MRI of the brain negative.  Patient will go home on Tylenol  only for pain.  Patient did better with physical therapy on the day of discharge.  Accelerated hypertension Continue lower dose Coreg , Norvasc .  Can go back on Cozaar  as outpatient.  Recommend checking blood pressure as outpatient.  Blood pressure 132/57 upon discharge.  Sinus bradycardia Cut back Coreg  dose to 6.25 mg twice a day instead of 25 mg twice a day.  Heart rate in the 60s upon discharge.  Heart rate was in the 50s on the higher dose of Coreg .  Pre-syncope Patient complains of vertigo prior to fall.  No recurrence of vertigo but almost fell over on 6/5 while walking with me.  Did better with physical therapy on the day of discharge.  AKI (acute kidney injury) (HCC) Fluid bolus on 6/5.  Creatinine went up to 1.09.  Creatinine upon discharge 0.84.  Can go back on Cozaar  as outpatient  (HFpEF) heart failure with  preserved ejection fraction (HCC) EF 60%.  Patient is euvolemic.  Decreased Coreg  dose.  Can go back on losartan  as outpatient.  Myocardial injury Troponin downtrending.  This is not a myocardial infarction.  Cerumen impaction Debrox otic solution  Atelectasis Atelectasis versus pneumonia.  Procalcitonin negative.  Will hold off on antibiotics.  Incentive spirometer.  Hypomagnesemia replaced  Right shoulder pain Right shoulder pain.  Limited range of motion right shoulder.  Patient did  not want to do any further imaging.  May improve over time.  Continue working with PT and OT.  Could be a rotator cuff tear.  X-ray did not show any fracture.  Will refer to orthopedics upon discharge.  Patient gradually getting better range of motion right shoulder but still limited.  Diabetes mellitus type 2, noninsulin dependent (HCC) Patient can go back on her Glucophage  as outpatient         Consultants: None Procedures performed: None Disposition: Home health Diet recommendation:  Cardiac and Carb modified diet DISCHARGE MEDICATION: Allergies as of 02/11/2024       Reactions   Lisinopril Other (See Comments)   cough        Medication List     STOP taking these medications    potassium chloride  10 MEQ tablet Commonly known as: KLOR-CON    torsemide 20 MG tablet Commonly known as: DEMADEX       TAKE these medications    acetaminophen  325 MG tablet Commonly known as: TYLENOL  Take 2 tablets (650 mg total) by mouth every 6 (six) hours as needed for mild pain (pain score 1-3), fever or moderate pain (pain score 4-6) (or Fever >/= 101).   amLODipine  2.5 MG tablet Commonly known as: NORVASC  Take 1 tablet (2.5 mg total) by mouth daily.   atorvastatin  10 MG tablet Commonly known as: LIPITOR Take 1 tablet by mouth daily.   azelastine 0.1 % nasal spray Commonly known as: ASTELIN Place 2 sprays into the nose 2 (two) times daily as needed for rhinitis.   carbamide peroxide 6.5 % OTIC solution Commonly known as: DEBROX Place 5 drops into both ears 2 (two) times daily for 5 days.   carvedilol  6.25 MG tablet Commonly known as: COREG  Take 1 tablet (6.25 mg total) by mouth 2 (two) times daily. What changed:  medication strength how much to take   Cholecalciferol  50 MCG (2000 UT) Caps Take 1 capsule by mouth daily.   colchicine 0.6 MG tablet Take 0.6 mg by mouth daily as needed.   ferrous sulfate 325 (65 FE) MG tablet Take 325 mg by mouth daily with  breakfast.   gabapentin 300 MG capsule Commonly known as: NEURONTIN Take 1 capsule (300 mg total) by mouth 2 (two) times daily. What changed: when to take this   losartan  100 MG tablet Commonly known as: COZAAR  Take 100 mg by mouth daily.   metFORMIN  500 MG 24 hr tablet Commonly known as: GLUCOPHAGE -XR Take 3 tablets (1,500 mg total) by mouth at bedtime.   Multi-Vitamin tablet Take 1 tablet by mouth daily.   pantoprazole  40 MG tablet Commonly known as: PROTONIX  Take 1 tablet by mouth daily.   polyethylene glycol 17 g packet Commonly known as: MIRALAX  / GLYCOLAX  Take 17 g by mouth daily.        Follow-up Information     Jimmy Moulding, MD Follow up on 02/17/2024.   Specialty: Internal Medicine Why: Appt @ 2:45 Contact information: [address] [facility] [place]  [address] [phone]         Elner Hahn, MD Follow up in 2 week(s).   Specialty: Orthopedic Surgery Why: shoulder pain after fall  Will call back with appointment details. Contact information: [address] [facility] [place] [address] [phone]         [facility]  Follow up.   Contact information: [address] [place] [address] [phone]                Discharge Exam: Filed Weights   02/09/24 0454 02/10/24 0442 02/11/24 0500  Weight: 50.8 kg 54.3 kg 51 kg   Physical Exam HENT:     Head: Normocephalic.     Ears:     Comments: Tympanic membrane obscured by wax    Mouth/Throat:     Pharynx: No oropharyngeal exudate.  Eyes:     General: Lids are normal.     Conjunctiva/sclera: Conjunctivae normal.  Cardiovascular:     Rate and Rhythm: Normal rate and regular rhythm.     Heart sounds: S1 normal and S2 normal. Murmur heard.     Systolic murmur is present with a grade of 2/6.  Pulmonary:     Breath sounds: No decreased breath sounds, wheezing, rhonchi or rales.  Abdominal:      Palpations: Abdomen is soft.     Tenderness: There is no abdominal tenderness.  Musculoskeletal:     Right lower leg: No swelling.     Left lower leg: No swelling.     Comments: Poor range of motion right shoulder.  Able to get better range of motion with passive range of motion of the right shoulder but painful.  Skin:    General: Skin is warm.     Comments: Bruise right forehead  Neurological:     Mental Status: She is alert and oriented to person, place, and time.      Condition at discharge: stable  The results of significant diagnostics from this hospitalization (including imaging, microbiology, ancillary and laboratory) are listed below for reference.   Imaging Studies: MR BRAIN WO CONTRAST Result Date: 02/11/2024 EXAM: MRI BRAIN WITHOUT CONTRAST 02/10/2024 04:29:43 PM TECHNIQUE: Multiplanar multisequence MRI of the head/brain was performed without the administration of intravenous contrast. COMPARISON: CT head without contrast 02/10/2024. CLINICAL HISTORY: Head trauma, minor (Age >= 65y). Mrs. Hammac states that she was in her normal state of health this morning when she stood up to grab her shoes but was unable to reach them. At some point after standing, she began to feel dizzy and believes she tried to sit down but missed the bed. She notes that somehow she fell face forward hitting her face on the floor. She adamantly denies losing consciousness. Due to this, EMS was called. See H\T\P for full HPI on admission \\T \ ED course. FINDINGS: BRAIN AND VENTRICLES: No acute infarct. No intracranial hemorrhage. No mass. No midline shift. No hydrocephalus. The sella is unremarkable. Normal flow voids. Moderate generalized atrophy is most evident in the frontal lobes bilaterally. Moderate confluent periventricular white matter T2 hyperintensities are present bilaterally. ORBITS: No acute abnormality. SINUSES AND MASTOIDS: No acute abnormality. BONES AND SOFT TISSUES: Normal marrow signal.  Right supraorbital scalp hematoma is again seen. IMPRESSION: 1. No acute intracranial abnormality related to the reported head trauma. 2. Moderate generalized atrophy, most evident in the frontal lobes bilaterally. 3. Moderate confluent periventricular white matter T2 hyperintensities bilaterally likely reflect the sequelae of chronic microvascular ischemia.  4. Right supraorbital scalp hematoma. Electronically signed by: Audree Leas MD 02/11/2024 04:57 AM EDT RP Workstation: [id number]   CT HEAD WO CONTRAST ( ) Result Date: 02/10/2024 CLINICAL DATA:  Head trauma, minor. EXAM: CT HEAD WITHOUT CONTRAST TECHNIQUE: Contiguous axial images were obtained from the base of the skull through the vertex without intravenous contrast. RADIATION DOSE REDUCTION: This exam was performed according to the departmental dose-optimization program which includes automated exposure control, adjustment of the mA and/or kV according to patient size and/or use of iterative reconstruction technique. COMPARISON:  02/07/2024 FINDINGS: Brain: Age related volume loss with extensive chronic small-vessel ischemic changes affecting the cerebral hemispheric white matter. No sign of acute infarction, mass lesion, hemorrhage, hydrocephalus or extra-axial collection. Vascular: There is atherosclerotic calcification of the major vessels at the base of the brain. Skull: No skull fracture. Sinuses/Orbits: Clear/normal Other: None IMPRESSION: No acute or traumatic finding. Age related volume loss with extensive chronic small-vessel ischemic changes of the cerebral hemispheric white matter. Electronically Signed   By: Bettylou Brunner M.D.   On: 02/10/2024 13:38   ECHOCARDIOGRAM COMPLETE Result Date: 02/09/2024    ECHOCARDIOGRAM REPORT   Patient Name:   MOANA MUNFORD Date of Exam: 02/09/2024 Medical Rec #:  [id number]         Height:       60.0 in Accession #:    [id number]        Weight:       112.0 lb Date of Birth:  08/05/1933          BSA:           1.459 m Patient Age:    89 years          BP:           159/61 mmHg Patient Gender: F                 HR:           51 bpm. Exam Location:  [facility] Procedure: 2D Echo, Cardiac Doppler and Color Doppler (Both Spectral and Color            Flow Doppler were utilized during procedure). Indications:     Syncope R55  History:         Patient has prior history of Echocardiogram examinations, most                  recent 01/21/2023. Risk Factors:Diabetes.  Sonographer:     Broadus Canes Referring Phys:  [id number] KELLY A GRIFFITH Diagnosing Phys: Joetta Mustache  Sonographer Comments: Technically challenging study due to limited acoustic windows, no parasternal window and no subcostal window. IMPRESSIONS  1. Left ventricular ejection fraction, by estimation, is 60 to 65%. The left ventricle has normal function. The left ventricle has no regional wall motion abnormalities. There is mild left ventricular hypertrophy. Left ventricular diastolic parameters are indeterminate.  2. Right ventricular systolic function is normal. The right ventricular size is normal.  3. Left atrial size was severely dilated.  4. A small pericardial effusion is present. The pericardial effusion is circumferential.  5. Moderate mitral valve regurgitation.  6. The aortic valve was not well visualized. Aortic valve regurgitation is trivial. No aortic stenosis is present. FINDINGS  Left Ventricle: Left ventricular ejection fraction, by estimation, is 60 to 65%. The left ventricle has normal function. The left ventricle has no regional wall motion abnormalities. The left ventricular internal cavity size was normal in size. There is  mild left  ventricular hypertrophy. Left ventricular diastolic parameters are indeterminate. Right Ventricle: The right ventricular size is normal. No increase in right ventricular wall thickness. Right ventricular systolic function is normal. Left Atrium: Left atrial size was severely dilated. Right Atrium: Right atrial size was  normal in size. Pericardium: A small pericardial effusion is present. The pericardial effusion is circumferential. Mitral Valve: There is mild thickening of the mitral valve leaflet(s). Moderate mitral valve regurgitation. MV peak gradient, 5.0 mmHg. The mean mitral valve gradient is 2.0 mmHg. Tricuspid Valve: The tricuspid valve is normal in structure. Tricuspid valve regurgitation is trivial. Aortic Valve: The aortic valve was not well visualized. Aortic valve regurgitation is trivial. No aortic stenosis is present. Aortic valve mean gradient measures 3.0 mmHg. Aortic valve peak gradient measures 5.2 mmHg. Aortic valve area, by VTI measures 2.99 cm. Pulmonic Valve: The pulmonic valve was not well visualized. Aorta: Aortic root could not be assessed. Venous: The inferior vena cava was not well visualized. IAS/Shunts: The interatrial septum was not well visualized.  LEFT VENTRICLE PLAX 2D LVIDd:         3.30 cm   Diastology LVIDs:         2.30 cm   LV e' medial:    6.42 cm/s LV PW:         1.20 cm   LV E/e' medial:  17.1 LV IVS:        1.60 cm   LV e' lateral:   8.49 cm/s LVOT diam:     2.00 cm   LV E/e' lateral: 13.0 LV SV:         76 LV SV Index:   52 LVOT Area:     3.14 cm  RIGHT VENTRICLE RV Basal diam:  2.40 cm RV Mid diam:    1.70 cm LEFT ATRIUM              Index         RIGHT ATRIUM           Index LA diam:        5.10 cm  3.50 cm/m    RA Area:     10.10 cm LA Vol (A2C):   106.0 ml 72.66 ml/m   RA Volume:   17.80 ml  12.20 ml/m LA Vol (A4C):   149.0 ml 102.13 ml/m LA Biplane Vol: 125.0 ml 85.68 ml/m  AORTIC VALVE AV Area (Vmax):    2.85 cm AV Area (Vmean):   2.48 cm AV Area (VTI):     2.99 cm AV Vmax:           113.50 cm/s AV Vmean:          79.100 cm/s AV VTI:            0.255 m AV Peak Grad:      5.2 mmHg AV Mean Grad:      3.0 mmHg LVOT Vmax:         103.00 cm/s LVOT Vmean:        62.400 cm/s LVOT VTI:          0.243 m LVOT/AV VTI ratio: 0.95 MITRAL VALVE                TRICUSPID VALVE MV Area  (PHT): 2.62 cm     TR Peak grad:   18.5 mmHg MV Area VTI:   2.31 cm     TR Vmax:        215.00 cm/s MV Peak grad:  5.0  mmHg MV Mean grad:  2.0 mmHg     SHUNTS MV Vmax:       1.12 m/s     Systemic VTI:  0.24 m MV Vmean:      57.1 cm/s    Systemic Diam: 2.00 cm MV Decel Time: 290 msec MV E velocity: 110.00 cm/s MV A velocity: 93.30 cm/s MV E/A ratio:  1.18 Joetta Mustache Electronically signed by Joetta Mustache Signature Date/Time: 02/09/2024/1:12:39 PM    Final    US  Venous Img Lower Bilateral (DVT) Result Date: 02/08/2024 CLINICAL DATA:  Positive D-dimer, edema EXAM: BILATERAL LOWER EXTREMITY VENOUS DOPPLER ULTRASOUND TECHNIQUE: Gray-scale sonography with compression, as well as color and duplex ultrasound, were performed to evaluate the deep venous system(s) from the level of the common femoral vein through the popliteal and proximal calf veins. COMPARISON:  01/14/2023 and previous FINDINGS: VENOUS Normal compressibility of the common femoral, superficial femoral, and popliteal veins, as well as the visualized calf veins. Visualized portions of profunda femoral vein and great saphenous vein unremarkable. No filling defects to suggest DVT on grayscale or color Doppler imaging. Doppler waveforms show normal direction of venous flow, normal respiratory plasticity and response to augmentation. OTHER Mildly complex 5.8 x 3.7 x 5.2 cm cystic collection in the posterior left popliteal fossa. Limitations: Edema limits visualization of calf veins bilaterally IMPRESSION: 1. Negative for DVT. 2. 5.8 cm left Baker's cyst. Electronically Signed   By: Nicoletta Barrier M.D.   On: 02/08/2024 16:16   CT Angio Chest Pulmonary Embolism (PE) W or WO Contrast Result Date: 02/08/2024 CLINICAL DATA:  Syncope, simple, normal neuro exam elevated d-dimer EXAM: CT ANGIOGRAPHY CHEST WITH CONTRAST TECHNIQUE: Multidetector CT imaging of the chest was performed using the standard protocol during bolus administration of intravenous contrast.  Multiplanar CT image reconstructions and MIPs were obtained to evaluate the vascular anatomy. RADIATION DOSE REDUCTION: This exam was performed according to the departmental dose-optimization program which includes automated exposure control, adjustment of the mA and/or kV according to patient size and/or use of iterative reconstruction technique. CONTRAST:  75mL OMNIPAQUE  IOHEXOL  350 MG/ML SOLN COMPARISON:  01/14/2023 FINDINGS: Cardiovascular: Heart size normal. Moderate pleural effusion, increased from previous. Satisfactory opacification of pulmonary arteries noted, and there is no evidence of pulmonary emboli. Mitral annulus calcifications. 3-vessel coronary calcifications. Fair contrast opacification of the thoracic aorta without aneurysm, dissection, or stenosis. Scattered mild calcified plaque. Mediastinum/Nodes: No mass or adenopathy. Lungs/Pleura: Trace pleural effusions right greater than left. No pneumothorax. Worsening dependent atelectasis/consolidation in the lung bases right greater than left. Upper Abdomen: No acute findings. Musculoskeletal: Progressive vertebral compression deformity of T8. Stable T11 vertebral compression deformity. Vertebral endplate spurring at multiple levels in the mid and lower thoracic spine. Spondylitic changes in the lower cervical spine. Bilateral shoulder DJD left worse than right. Review of the MIP images confirms the above findings. IMPRESSION: 1. Negative for acute PE or thoracic aortic dissection. 2. Worsening dependent atelectasis/consolidation in the lung bases right greater than left. 3. Progressive vertebral compression deformity of T8. 4. Coronary and aortic Atherosclerosis (ICD10-I70.0). Electronically Signed   By: Nicoletta Barrier M.D.   On: 02/08/2024 16:10   DG Chest 2 View Result Date: 02/07/2024 CLINICAL DATA:  Shortness of breath and right hip pain after falling this morning. EXAM: CHEST - 2 VIEW COMPARISON:  Radiographs 07/19/2023 and 01/20/2023.  CT  01/14/2023. FINDINGS: The heart size and mediastinal contours are stable with cardiac enlargement and aortic atherosclerosis. There is chronic vascular congestion mild bibasilar atelectasis.  No edema, confluent airspace disease, pleural effusion or pneumothorax. No acute osseous findings are identified in the chest. Grossly stable thoracic compression deformities and asymmetric left glenohumeral arthropathy. IMPRESSION: No evidence of acute chest injury. Chronic vascular congestion and bibasilar atelectasis. Electronically Signed   By: Elmon Hagedorn M.D.   On: 02/07/2024 14:55   DG Hip Unilat With Pelvis 2-3 Views Right Result Date: 02/07/2024 CLINICAL DATA:  Right hip pain after falling this morning. Previous hip fracture with fixation. EXAM: DG HIP (WITH OR WITHOUT PELVIS) 2-3V RIGHT COMPARISON:  Radiographs 07/19/2023. FINDINGS: Postsurgical changes from previous right femoral intramedullary nail and dynamic screw fixation for a comminuted intertrochanteric fracture. The visualized hardware appears intact without loosening. The distal end of the intramedullary nail is not included on this examination of the hip. Possible incomplete healing of the previously demonstrated intertrochanteric fracture. There is some heterotopic ossification adjacent to the right lesser trochanter. There are old left parasymphyseal pubic rami fractures and multilevel spondylosis. No definite acute fractures are identified. IMPRESSION: No definite acute fracture identified. Postsurgical changes from previous right femoral intramedullary nail and dynamic screw fixation for a comminuted intertrochanteric fracture. Possible incomplete healing of the previously demonstrated intertrochanteric fracture. Electronically Signed   By: Elmon Hagedorn M.D.   On: 02/07/2024 14:53   CT Head Wo Contrast Result Date: 02/07/2024 CLINICAL DATA:  Fall, head injury, forehead hematoma EXAM: CT HEAD WITHOUT CONTRAST CT MAXILLOFACIAL WITHOUT CONTRAST  CT CERVICAL SPINE WITHOUT CONTRAST TECHNIQUE: Multidetector CT imaging of the head, cervical spine, and maxillofacial structures were performed using the standard protocol without intravenous contrast. Multiplanar CT image reconstructions of the cervical spine and maxillofacial structures were also generated. RADIATION DOSE REDUCTION: This exam was performed according to the departmental dose-optimization program which includes automated exposure control, adjustment of the mA and/or kV according to patient size and/or use of iterative reconstruction technique. COMPARISON:  None Available. FINDINGS: CT HEAD FINDINGS Brain: No evidence of acute infarction, hemorrhage, hydrocephalus, extra-axial collection or mass lesion/mass effect. Extensive periventricular and deep white matter hypodensity. Vascular: No hyperdense vessel or unexpected calcification. CT FACIAL BONES FINDINGS Skull: Normal. Negative for fracture or focal lesion. Facial bones: No displaced fractures or dislocations. Sinuses/Orbits: No acute finding. Other: Soft tissue contusion of the right forehead. CT CERVICAL SPINE FINDINGS Alignment: Normal. Skull base and vertebrae: No acute fracture. Chronic nondisplaced fracture deformity of the dens with extensive associated degenerative pannus about the atlantoaxial joint (series 7, image 31). No primary bone lesion or focal pathologic process. Soft tissues and spinal canal: No prevertebral fluid or swelling. No visible canal hematoma. Disc levels: Severe disc space height loss and osteophytosis, worst at C5-C7. Upper chest: Negative. Other: None. IMPRESSION: 1. No acute intracranial pathology. Advanced small-vessel white matter disease in keeping with patient age. 2. No displaced fractures or dislocations of the facial bones. 3. Soft tissue contusion of the right forehead. 4. No fracture or static subluxation of the cervical spine. 5. Chronic nondisplaced fracture deformity of the dens with extensive  associated degenerative pannus about the atlantoaxial joint. 6. Severe cervical disc degenerative disease. Electronically Signed   By: Fredricka Jenny M.D.   On: 02/07/2024 13:43   CT Cervical Spine Wo Contrast Result Date: 02/07/2024 CLINICAL DATA:  Fall, head injury, forehead hematoma EXAM: CT HEAD WITHOUT CONTRAST CT MAXILLOFACIAL WITHOUT CONTRAST CT CERVICAL SPINE WITHOUT CONTRAST TECHNIQUE: Multidetector CT imaging of the head, cervical spine, and maxillofacial structures were performed using the standard protocol without intravenous contrast. Multiplanar CT image reconstructions of  the cervical spine and maxillofacial structures were also generated. RADIATION DOSE REDUCTION: This exam was performed according to the departmental dose-optimization program which includes automated exposure control, adjustment of the mA and/or kV according to patient size and/or use of iterative reconstruction technique. COMPARISON:  None Available. FINDINGS: CT HEAD FINDINGS Brain: No evidence of acute infarction, hemorrhage, hydrocephalus, extra-axial collection or mass lesion/mass effect. Extensive periventricular and deep white matter hypodensity. Vascular: No hyperdense vessel or unexpected calcification. CT FACIAL BONES FINDINGS Skull: Normal. Negative for fracture or focal lesion. Facial bones: No displaced fractures or dislocations. Sinuses/Orbits: No acute finding. Other: Soft tissue contusion of the right forehead. CT CERVICAL SPINE FINDINGS Alignment: Normal. Skull base and vertebrae: No acute fracture. Chronic nondisplaced fracture deformity of the dens with extensive associated degenerative pannus about the atlantoaxial joint (series 7, image 31). No primary bone lesion or focal pathologic process. Soft tissues and spinal canal: No prevertebral fluid or swelling. No visible canal hematoma. Disc levels: Severe disc space height loss and osteophytosis, worst at C5-C7. Upper chest: Negative. Other: None. IMPRESSION: 1.  No acute intracranial pathology. Advanced small-vessel white matter disease in keeping with patient age. 2. No displaced fractures or dislocations of the facial bones. 3. Soft tissue contusion of the right forehead. 4. No fracture or static subluxation of the cervical spine. 5. 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 6. Severe cervical disc degenerative disease. Electronically Signed   By: Fredricka Jenny M.D.   On: 02/07/2024 13:43   CT Maxillofacial WO CM Result Date: 02/07/2024 CLINICAL DATA:  Fall, head injury, forehead hematoma EXAM: CT HEAD WITHOUT CONTRAST CT MAXILLOFACIAL WITHOUT CONTRAST CT CERVICAL SPINE WITHOUT CONTRAST TECHNIQUE: Multidetector CT imaging of the head, cervical spine, and maxillofacial structures were performed using the standard protocol without intravenous contrast. Multiplanar CT image reconstructions of the cervical spine and maxillofacial structures were also generated. RADIATION DOSE REDUCTION: This exam was performed according to the departmental dose-optimization program which includes automated exposure control, adjustment of the mA and/or kV according to patient size and/or use of iterative reconstruction technique. COMPARISON:  None Available. FINDINGS: CT HEAD FINDINGS Brain: No evidence of acute infarction, hemorrhage, hydrocephalus, extra-axial collection or mass lesion/mass effect. Extensive periventricular and deep white matter hypodensity. Vascular: No hyperdense vessel or unexpected calcification. CT FACIAL BONES FINDINGS Skull: Normal. Negative for fracture or focal lesion. Facial bones: No displaced fractures or dislocations. Sinuses/Orbits: No acute finding. Other: Soft tissue contusion of the right forehead. CT CERVICAL SPINE FINDINGS Alignment: Normal. Skull base and vertebrae: No acute fracture. Chronic nondisplaced fracture deformity of the dens with extensive associated  degenerative pannus about the atlantoaxial joint (series 7, image 31). No primary bone lesion or focal pathologic process. Soft tissues and spinal canal: No prevertebral fluid or swelling. No visible canal hematoma. Disc levels: Severe disc space height loss and osteophytosis, worst at C5-C7. Upper chest: Negative. Other: None. IMPRESSION: 1. No acute intracranial pathology. Advanced small-vessel white matter disease in keeping with patient age. 2. No displaced fractures or dislocations of the facial bones. 3. Soft tissue contusion of the right forehead. 4. No fracture or static subluxation of the cervical spine. 5. 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 6. Severe cervical disc degenerative disease. Electronically Signed   By: Fredricka Jenny M.D.   On: 02/07/2024 13:43   DG Shoulder Right Result Date: 02/07/2024 CLINICAL DATA:  Status post fall with  right shoulder pain EXAM: RIGHT SHOULDER - 3 VIEW COMPARISON:  None Available. FINDINGS: There is no evidence of fracture or dislocation. Degenerative changes of the right shoulder. Soft tissues are unremarkable. Partially imaged old posterior right rib fracture. IMPRESSION: 1. No acute fracture or dislocation. 2. Degenerative changes of the right shoulder. Electronically Signed   By: Limin  Xu M.D.   On: 02/07/2024 13:38    Microbiology: No results found for this or any previous visit.  Labs: CBC: Recent Labs  Lab 02/07/24 1247 02/10/24 0446 02/11/24 0415  WBC 9.0  --  6.7  NEUTROABS 6.6  --   --   HGB 12.1 10.3* 10.4*  HCT 36.7  --  31.4*  MCV 89.1  --  88.2  PLT 173  --  131*   Basic Metabolic Panel: Recent Labs  Lab 02/07/24 1247 02/08/24 0257 02/09/24 0312 02/10/24 0446 02/11/24 0415  NA 139 137 136 137 136  K 3.9 3.1* 5.0 4.9 4.3  CL 101 101 103 104 105  CO2 27 25 26 25 24   GLUCOSE 156* 122* 114* 131* 140*  BUN 13 17 25* 32* 26*  CREATININE  0.72 0.72 0.99 1.09* 0.84  CALCIUM  9.6 9.1 9.0 9.1 9.0  MG  --  1.1*  --  2.5*  --    Liver Function Tests: Recent Labs  Lab 02/07/24 1247  AST 27  ALT 17  ALKPHOS 62  BILITOT 1.0  PROT 6.8  ALBUMIN 3.9   CBG: Recent Labs  Lab 02/10/24 0451 02/11/24 0519  GLUCAP 121* 147*    Discharge time spent: greater than 30 minutes.  Signed: Verla Glaze, MD [facility] 02/11/2024

## 2024-02-11 NOTE — Plan of Care (Signed)

## 2024-02-11 NOTE — Progress Notes (Signed)
 Occupational Therapy Treatment Patient Details Name: Tracy Spears MRN: [id number] DOB: 1933-08-24 Today's Date: 02/11/2024   History of present illness Pt is a 89 year old female presented to the ED after GLF, work up includes pre syncope, elevated troponin,     PMH significant for Diet controlled type 2 diabetes, hypertension, hyperlipidemia, previous pericardial effusion, GERD   OT comments  Chart reviewed to date, pt greeted in chair, alert and oriented x4, agreeable to OT tx session targeting improving functional activity tolerance for improved ADL performance. Pt continues to present with RUE deficits and education again provided re: ongoing follow up as she is R handed and RUE deficits will affect safe ADL completion. Pt performs functional transfers with supervision-CGA, amb in room with RW with supervision, standing grooming tasks with RW at sink level with supervision. MIN A required for LB dressing (she reports she will have family to help). Educated pt re: safe ADL completion with DME/AE/compensatory techniques.All questions answered within scope. Pt is left in bedside chair, all needs met. OT will continue to follow.       If plan is discharge home, recommend the following:  A little help with walking and/or transfers;A little help with bathing/dressing/bathroom;Help with stairs or ramp for entrance;Assistance with cooking/housework   Equipment Recommendations  None recommended by OT;Other (comment) (pt has recommended equipment)    Recommendations for Other Services      Precautions / Restrictions Precautions Precautions: Fall Recall of Precautions/Restrictions: Intact Restrictions Weight Bearing Restrictions Per Provider Order: No       Mobility Bed Mobility               General bed mobility comments: NT in recliner pre/post session    Transfers Overall transfer level: Needs assistance Equipment used: Rolling walker (2 wheels) Transfers: Sit to/from  Stand Sit to Stand: Supervision                 Balance Overall balance assessment: Needs assistance Sitting-balance support: Feet supported Sitting balance-Leahy Scale: Good     Standing balance support: Bilateral upper extremity supported, During functional activity, Reliant on assistive device for balance Standing balance-Leahy Scale: Fair                             ADL either performed or assessed with clinical judgement   ADL Overall ADL's : Needs assistance/impaired     Grooming: Wash/dry hands;Standing;Supervision/safety Grooming Details (indicate cue type and reason): with RW at sink             Lower Body Dressing: Minimal assistance;Sit to/from stand   Toilet Transfer: Supervision/safety;Contact guard assist;Rolling walker (2 wheels);Regular Toilet;Grab bars   Toileting- Clothing Manipulation and Hygiene: Supervision/safety;Sitting/lateral lean Toileting - Clothing Manipulation Details (indicate cue type and reason): continent urine     Functional mobility during ADLs: Supervision/safety;Rolling walker (2 wheels) (approx 15' two attempts)      Extremity/Trunk Assessment              Vision       Perception     Praxis     Communication Communication Communication: No apparent difficulties   Cognition Arousal: Alert Behavior During Therapy: WFL for tasks assessed/performed Cognition: No apparent impairments           Executive functioning impairment (select all impairments): Reasoning                   Following commands: Intact  Cueing   Cueing Techniques: Verbal cues  Exercises Other Exercises Other Exercises: edu re: importance of follow up for R shoulder, safe ADL completion    Shoulder Instructions       General Comments vss throughout    Pertinent Vitals/ Pain       Pain Assessment Pain Assessment: Faces Faces Pain Scale: Hurts a little bit Pain Location: R shoulder Pain Descriptors /  Indicators: Discomfort, Grimacing Pain Intervention(s): Monitored during session, Repositioned  Home Living                                          Prior Functioning/Environment              Frequency  Min 3X/week        Progress Toward Goals  OT Goals(current goals can now be found in the care plan section)  Progress towards OT goals: Progressing toward goals  Acute Rehab OT Goals Time For Goal Achievement: 02/23/24  Plan      Co-evaluation                 AM-PAC OT "6 Clicks" Daily Activity     Outcome Measure   Help from another person eating meals?: None Help from another person taking care of personal grooming?: None Help from another person toileting, which includes using toliet, bedpan, or urinal?: None Help from another person bathing (including washing, rinsing, drying)?: A Little Help from another person to put on and taking off regular upper body clothing?: None Help from another person to put on and taking off regular lower body clothing?: A Little 6 Click Score: 22    End of Session Equipment Utilized During Treatment: Rolling walker (2 wheels)  OT Visit Diagnosis: Other abnormalities of gait and mobility (R26.89);Muscle weakness (generalized) (M62.81)   Activity Tolerance Patient tolerated treatment well   Patient Left in chair;with call bell/phone within reach;with chair alarm set;with family/visitor present   Nurse Communication Mobility status        Time: 1116-1130 OT Time Calculation (min): 14 min  Charges: OT General Charges $OT Visit: 1 Visit OT Treatments $Self Care/Home Management : 8-22 mins  Gerre Kraft, OTD OTR/L  02/11/24, 12:45 PM

## 2024-02-11 NOTE — TOC Transition Note (Signed)
 Transition of Care [facility]) - Discharge Note   Patient Details  Name: BITHA FAUTEUX MRN: [id number] Date of Birth: [birth year]  [facility]) CM/SW Contact:  Alexandra Ice, RN Phone Number: 02/11/2024, 9:46 AM   Clinical Narrative:    Patient to discharge home with home health services. [facility] set up for PT, and OT. Information added to AVS. No other discharge needs identified by TOC.   Final next level of care: Home w Home Health Services Barriers to Discharge: Barriers Resolved   Patient Goals and CMS Choice   CMS Medicare.gov Compare Post Acute Care list provided to:: Patient Choice offered to / list presented to : Patient      Discharge Placement                  Name of family member notified: Porfirio Bristol Patient and family notified of of transfer: 02/11/24  Discharge Plan and Services Additional resources added to the After Visit Summary for                    DME Agency: NA       HH Arranged: PT, OT HH Agency: [facility] Date HH Agency Contacted: 02/11/24 Time HH Agency Contacted: 0945 Representative spoke with at [facility] Agency: Shaun  Social Drivers of Health (SDOH) Interventions SDOH Screenings   Food Insecurity: No Food Insecurity (02/08/2024)  Housing: Low Risk  (02/08/2024)  Transportation Needs: No Transportation Needs (02/08/2024)  Utilities: Not At Risk (02/08/2024)  Social Connections: Unknown (02/08/2024)  Tobacco Use: Low Risk  (11/17/2023)   Received from [facility]     Readmission Risk Interventions     No data to display

## 2024-02-11 NOTE — Progress Notes (Signed)
 Physical Therapy Treatment Patient Details Name: Tracy Spears MRN: [id number] DOB: Apr 13, 1933 Today's Date: 02/11/2024   History of Present Illness Pt is a 89 year old female presented to the ED after GLF, work up includes pre syncope, elevated troponin,     PMH significant for Diet controlled type 2 diabetes, hypertension, hyperlipidemia, previous pericardial effusion, GERD    PT Comments  Pt was long sitting in bed upon arrival. She is A and O x 3. Agrees to session and remains cooperative and pleasant throughout. Demonstrated much improved activity tolerance versus previous date. DC recs remain appropriate to maximize independence and safety with all ADLs.    If plan is discharge home, recommend the following: A little help with walking and/or transfers;A little help with bathing/dressing/bathroom;Help with stairs or ramp for entrance;Assist for transportation;Assistance with cooking/housework     Equipment Recommendations  None recommended by PT       Precautions / Restrictions Precautions Precautions: Fall Recall of Precautions/Restrictions: Intact Restrictions Weight Bearing Restrictions Per Provider Order: No     Mobility  Bed Mobility Overal bed mobility: Needs Assistance Bed Mobility: Supine to Sit, Sit to Supine  Supine to sit: Supervision, HOB elevated  General bed mobility comments: increased time required to exit bed with vcs for sequencing and technique improvements    Transfers Overall transfer level: Needs assistance Equipment used: Rolling walker (2 wheels) Transfers: Sit to/from Stand Sit to Stand: Supervision    Ambulation/Gait Ambulation/Gait assistance: Supervision Gait Distance (Feet): 200 Feet Assistive device: Rolling walker (2 wheels) Gait Pattern/deviations: Step-through pattern, Decreased step length - right, Decreased step length - left Gait velocity: decreased  General Gait Details: pt demonstrated good gait tolerance and endorses " doing  alot better today." no LOB with use of RW. constant vcs for erecting posture     Balance Overall balance assessment: Needs assistance Sitting-balance support: Feet supported Sitting balance-Leahy Scale: Good     Standing balance support: Bilateral upper extremity supported, During functional activity, Reliant on assistive device for balance Standing balance-Leahy Scale: Fair       Hotel manager: No apparent difficulties  Cognition Arousal: Alert Behavior During Therapy: WFL for tasks assessed/performed   PT - Cognitive impairments: No apparent impairments    PT - Cognition Comments: Pt is A and O x 3. pleasant throughout Following commands: Intact      Cueing Cueing Techniques: Verbal cues         Pertinent Vitals/Pain Pain Assessment Pain Assessment: 0-10 Pain Score: 4  Pain Location: R shoulder Pain Descriptors / Indicators: Discomfort, Grimacing Pain Intervention(s): Limited activity within patient's tolerance, Monitored during session, Premedicated before session, Repositioned     PT Goals (current goals can now be found in the care plan section) Acute Rehab PT Goals Patient Stated Goal: to go home Progress towards PT goals: Progressing toward goals    Frequency    Min 3X/week       AM-PAC PT "6 Clicks" Mobility   Outcome Measure  Help needed turning from your back to your side while in a flat bed without using bedrails?: A Little Help needed moving from lying on your back to sitting on the side of a flat bed without using bedrails?: A Little Help needed moving to and from a bed to a chair (including a wheelchair)?: A Little Help needed standing up from a chair using your arms (e.g., wheelchair or bedside chair)?: A Little Help needed to walk in hospital room?: A Little Help needed climbing  3-5 steps with a railing? : A Lot 6 Click Score: 17    End of Session   Activity Tolerance: Patient tolerated treatment  well Patient left: in chair;with call bell/phone within reach;with chair alarm set Nurse Communication: Mobility status PT Visit Diagnosis: Unsteadiness on feet (R26.81);Other abnormalities of gait and mobility (R26.89);History of falling (Z91.81);Difficulty in walking, not elsewhere classified (R26.2);Pain Pain - Right/Left: Right Pain - part of body: Shoulder     Time: 1010-1018 PT Time Calculation (min) (ACUTE ONLY): 8 min  Charges:    $Gait Training: 8-22 mins PT General Charges $$ ACUTE PT VISIT: 1 Visit                     Chester Costa PTA 02/11/24, 10:35 AM

## 2024-02-11 NOTE — Discharge Instructions (Addendum)
 Salonpas 4% is over the counter and can apply to right shoulder for 12 hours and then take off for 12 hours every day   [facility] They will call you to set up when they are coming out to see you   [address], [place], [place] [address] Hours:  Open ? Closes 5?PM Phone: [phone]

## 2024-02-17 DIAGNOSIS — I10 Essential (primary) hypertension: Secondary | ICD-10-CM | POA: Diagnosis not present

## 2024-02-21 DIAGNOSIS — I5030 Unspecified diastolic (congestive) heart failure: Secondary | ICD-10-CM | POA: Diagnosis not present

## 2024-02-21 DIAGNOSIS — M19011 Primary osteoarthritis, right shoulder: Secondary | ICD-10-CM | POA: Diagnosis not present

## 2024-02-21 DIAGNOSIS — K219 Gastro-esophageal reflux disease without esophagitis: Secondary | ICD-10-CM | POA: Diagnosis not present

## 2024-02-21 DIAGNOSIS — N179 Acute kidney failure, unspecified: Secondary | ICD-10-CM | POA: Diagnosis not present

## 2024-02-21 DIAGNOSIS — H6120 Impacted cerumen, unspecified ear: Secondary | ICD-10-CM | POA: Diagnosis not present

## 2024-02-21 DIAGNOSIS — Z7984 Long term (current) use of oral hypoglycemic drugs: Secondary | ICD-10-CM | POA: Diagnosis not present

## 2024-02-21 DIAGNOSIS — R001 Bradycardia, unspecified: Secondary | ICD-10-CM | POA: Diagnosis not present

## 2024-02-21 DIAGNOSIS — Z556 Problems related to health literacy: Secondary | ICD-10-CM | POA: Diagnosis not present

## 2024-02-21 DIAGNOSIS — Z7982 Long term (current) use of aspirin: Secondary | ICD-10-CM | POA: Diagnosis not present

## 2024-02-21 DIAGNOSIS — E119 Type 2 diabetes mellitus without complications: Secondary | ICD-10-CM | POA: Diagnosis not present

## 2024-02-21 DIAGNOSIS — I110 Hypertensive heart disease with heart failure: Secondary | ICD-10-CM | POA: Diagnosis not present

## 2024-02-21 DIAGNOSIS — I252 Old myocardial infarction: Secondary | ICD-10-CM | POA: Diagnosis not present

## 2024-02-21 DIAGNOSIS — I10 Essential (primary) hypertension: Secondary | ICD-10-CM | POA: Diagnosis not present

## 2024-02-21 DIAGNOSIS — J9811 Atelectasis: Secondary | ICD-10-CM | POA: Diagnosis not present

## 2024-02-21 DIAGNOSIS — Z85828 Personal history of other malignant neoplasm of skin: Secondary | ICD-10-CM | POA: Diagnosis not present

## 2024-02-21 DIAGNOSIS — I5A Non-ischemic myocardial injury (non-traumatic): Secondary | ICD-10-CM | POA: Diagnosis not present

## 2024-02-21 DIAGNOSIS — E785 Hyperlipidemia, unspecified: Secondary | ICD-10-CM | POA: Diagnosis not present

## 2024-02-21 DIAGNOSIS — Z9181 History of falling: Secondary | ICD-10-CM | POA: Diagnosis not present

## 2024-02-23 DIAGNOSIS — D649 Anemia, unspecified: Secondary | ICD-10-CM | POA: Diagnosis not present

## 2024-02-23 DIAGNOSIS — I10 Essential (primary) hypertension: Secondary | ICD-10-CM | POA: Diagnosis not present

## 2024-02-23 DIAGNOSIS — R42 Dizziness and giddiness: Secondary | ICD-10-CM | POA: Diagnosis not present

## 2024-04-20 DIAGNOSIS — E785 Hyperlipidemia, unspecified: Secondary | ICD-10-CM | POA: Diagnosis not present

## 2024-04-20 DIAGNOSIS — E1122 Type 2 diabetes mellitus with diabetic chronic kidney disease: Secondary | ICD-10-CM | POA: Diagnosis not present

## 2024-04-20 DIAGNOSIS — E1169 Type 2 diabetes mellitus with other specified complication: Secondary | ICD-10-CM | POA: Diagnosis not present

## 2024-04-20 DIAGNOSIS — N1832 Chronic kidney disease, stage 3b: Secondary | ICD-10-CM | POA: Diagnosis not present

## 2024-04-20 DIAGNOSIS — I10 Essential (primary) hypertension: Secondary | ICD-10-CM | POA: Diagnosis not present

## 2024-05-02 DIAGNOSIS — E1122 Type 2 diabetes mellitus with diabetic chronic kidney disease: Secondary | ICD-10-CM | POA: Diagnosis not present

## 2024-05-02 DIAGNOSIS — K2289 Other specified disease of esophagus: Secondary | ICD-10-CM | POA: Diagnosis not present

## 2024-05-02 DIAGNOSIS — R634 Abnormal weight loss: Secondary | ICD-10-CM | POA: Diagnosis not present

## 2024-05-02 DIAGNOSIS — K5900 Constipation, unspecified: Secondary | ICD-10-CM | POA: Diagnosis not present

## 2024-05-02 DIAGNOSIS — E785 Hyperlipidemia, unspecified: Secondary | ICD-10-CM | POA: Diagnosis not present

## 2024-05-02 DIAGNOSIS — E1169 Type 2 diabetes mellitus with other specified complication: Secondary | ICD-10-CM | POA: Diagnosis not present

## 2024-05-02 DIAGNOSIS — R1319 Other dysphagia: Secondary | ICD-10-CM | POA: Diagnosis not present

## 2024-05-02 DIAGNOSIS — Z0000 Encounter for general adult medical examination without abnormal findings: Secondary | ICD-10-CM | POA: Diagnosis not present

## 2024-05-02 DIAGNOSIS — E871 Hypo-osmolality and hyponatremia: Secondary | ICD-10-CM | POA: Diagnosis not present

## 2024-05-02 DIAGNOSIS — N1832 Chronic kidney disease, stage 3b: Secondary | ICD-10-CM | POA: Diagnosis not present

## 2024-05-02 DIAGNOSIS — D500 Iron deficiency anemia secondary to blood loss (chronic): Secondary | ICD-10-CM | POA: Diagnosis not present

## 2024-05-02 DIAGNOSIS — Z1331 Encounter for screening for depression: Secondary | ICD-10-CM | POA: Diagnosis not present

## 2024-05-02 DIAGNOSIS — I10 Essential (primary) hypertension: Secondary | ICD-10-CM | POA: Diagnosis not present

## 2024-05-22 DIAGNOSIS — E785 Hyperlipidemia, unspecified: Secondary | ICD-10-CM | POA: Diagnosis not present

## 2024-05-22 DIAGNOSIS — E1169 Type 2 diabetes mellitus with other specified complication: Secondary | ICD-10-CM | POA: Diagnosis not present

## 2024-05-22 DIAGNOSIS — E1122 Type 2 diabetes mellitus with diabetic chronic kidney disease: Secondary | ICD-10-CM | POA: Diagnosis not present

## 2024-05-22 DIAGNOSIS — I10 Essential (primary) hypertension: Secondary | ICD-10-CM | POA: Diagnosis not present

## 2024-05-22 DIAGNOSIS — N1832 Chronic kidney disease, stage 3b: Secondary | ICD-10-CM | POA: Diagnosis not present

## 2024-06-14 ENCOUNTER — Ambulatory Visit: Admitting: Dermatology

## 2024-06-14 ENCOUNTER — Ambulatory Visit

## 2024-06-20 ENCOUNTER — Ambulatory Visit: Payer: PPO | Admitting: Dermatology
